# Patient Record
Sex: Female | Born: 1945
Health system: Southern US, Community
[De-identification: ages and names within clinical notes are randomized; demographics above are authoritative.]

## PROBLEM LIST (undated history)

## (undated) DIAGNOSIS — E0836 Diabetes mellitus due to underlying condition with diabetic cataract: Secondary | ICD-10-CM

## (undated) DIAGNOSIS — C55 Malignant neoplasm of uterus, part unspecified: Secondary | ICD-10-CM

## (undated) HISTORY — DX: Malignant neoplasm of uterus, part unspecified: C55

## (undated) HISTORY — DX: Diabetes mellitus due to underlying condition with diabetic cataract: E08.36

## (undated) HISTORY — PX: CHOLECYSTECTOMY: SHX55

## (undated) HISTORY — PX: ABDOMINAL HYSTERECTOMY: SHX81

## (undated) HISTORY — PX: SPINE SURGERY: SHX786

---
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
## 1998-07-18 ENCOUNTER — Ambulatory Visit: Admission: RE | Admit: 1998-07-18 | Discharge: 1998-07-18 | Payer: Self-pay | Admitting: *Deleted

## 1999-09-28 ENCOUNTER — Encounter (INDEPENDENT_AMBULATORY_CARE_PROVIDER_SITE_OTHER): Payer: Self-pay | Admitting: Specialist

## 1999-09-28 ENCOUNTER — Other Ambulatory Visit: Admission: RE | Admit: 1999-09-28 | Discharge: 1999-09-28 | Payer: Self-pay | Admitting: Obstetrics and Gynecology

## 1999-10-01 ENCOUNTER — Encounter (INDEPENDENT_AMBULATORY_CARE_PROVIDER_SITE_OTHER): Payer: Self-pay

## 1999-10-01 ENCOUNTER — Other Ambulatory Visit: Admission: RE | Admit: 1999-10-01 | Discharge: 1999-10-01 | Payer: Self-pay | Admitting: Obstetrics and Gynecology

## 1999-10-02 ENCOUNTER — Ambulatory Visit (HOSPITAL_COMMUNITY): Admission: RE | Admit: 1999-10-02 | Discharge: 1999-10-02 | Payer: Self-pay | Admitting: Obstetrics and Gynecology

## 1999-10-02 ENCOUNTER — Encounter: Payer: Self-pay | Admitting: Obstetrics and Gynecology

## 1999-10-06 ENCOUNTER — Ambulatory Visit: Admission: RE | Admit: 1999-10-06 | Discharge: 1999-10-06 | Payer: Self-pay | Admitting: Gynecologic Oncology

## 1999-10-30 ENCOUNTER — Encounter: Payer: Self-pay | Admitting: Obstetrics and Gynecology

## 1999-11-04 ENCOUNTER — Encounter (INDEPENDENT_AMBULATORY_CARE_PROVIDER_SITE_OTHER): Payer: Self-pay | Admitting: Specialist

## 1999-11-04 ENCOUNTER — Inpatient Hospital Stay (HOSPITAL_COMMUNITY): Admission: RE | Admit: 1999-11-04 | Discharge: 1999-11-07 | Payer: Self-pay | Admitting: Obstetrics and Gynecology

## 1999-11-04 DIAGNOSIS — C55 Malignant neoplasm of uterus, part unspecified: Secondary | ICD-10-CM

## 1999-11-04 HISTORY — DX: Malignant neoplasm of uterus, part unspecified: C55

## 1999-11-25 ENCOUNTER — Ambulatory Visit: Admission: RE | Admit: 1999-11-25 | Discharge: 1999-11-25 | Payer: Self-pay | Admitting: Gynecology

## 1999-11-30 ENCOUNTER — Encounter: Admission: RE | Admit: 1999-11-30 | Discharge: 2000-02-28 | Payer: Self-pay | Admitting: Radiation Oncology

## 2000-04-08 ENCOUNTER — Other Ambulatory Visit: Admission: RE | Admit: 2000-04-08 | Discharge: 2000-04-08 | Payer: Self-pay | Admitting: Obstetrics and Gynecology

## 2000-05-18 ENCOUNTER — Ambulatory Visit: Admission: RE | Admit: 2000-05-18 | Discharge: 2000-05-18 | Payer: Self-pay | Admitting: Gynecologic Oncology

## 2000-05-19 ENCOUNTER — Other Ambulatory Visit: Admission: RE | Admit: 2000-05-19 | Discharge: 2000-05-19 | Payer: Self-pay | Admitting: Obstetrics and Gynecology

## 2000-09-02 ENCOUNTER — Other Ambulatory Visit: Admission: RE | Admit: 2000-09-02 | Discharge: 2000-09-02 | Payer: Self-pay | Admitting: Obstetrics and Gynecology

## 2000-12-27 ENCOUNTER — Other Ambulatory Visit: Admission: RE | Admit: 2000-12-27 | Discharge: 2000-12-27 | Payer: Self-pay | Admitting: Gynecologic Oncology

## 2000-12-27 ENCOUNTER — Ambulatory Visit: Admission: RE | Admit: 2000-12-27 | Discharge: 2000-12-27 | Payer: Self-pay | Admitting: Gynecologic Oncology

## 2001-02-24 ENCOUNTER — Other Ambulatory Visit: Admission: RE | Admit: 2001-02-24 | Discharge: 2001-02-24 | Payer: Self-pay | Admitting: Obstetrics and Gynecology

## 2002-01-12 ENCOUNTER — Ambulatory Visit (HOSPITAL_COMMUNITY): Admission: RE | Admit: 2002-01-12 | Discharge: 2002-01-12 | Payer: Self-pay | Admitting: Obstetrics and Gynecology

## 2002-01-12 ENCOUNTER — Encounter: Payer: Self-pay | Admitting: Obstetrics and Gynecology

## 2002-02-20 ENCOUNTER — Other Ambulatory Visit: Admission: RE | Admit: 2002-02-20 | Discharge: 2002-02-20 | Payer: Self-pay | Admitting: Obstetrics and Gynecology

## 2002-02-21 ENCOUNTER — Encounter (INDEPENDENT_AMBULATORY_CARE_PROVIDER_SITE_OTHER): Payer: Self-pay | Admitting: Specialist

## 2002-02-21 ENCOUNTER — Ambulatory Visit: Admission: RE | Admit: 2002-02-21 | Discharge: 2002-02-21 | Payer: Self-pay | Admitting: Gynecologic Oncology

## 2002-08-31 ENCOUNTER — Other Ambulatory Visit: Admission: RE | Admit: 2002-08-31 | Discharge: 2002-08-31 | Payer: Self-pay | Admitting: Obstetrics and Gynecology

## 2003-09-18 ENCOUNTER — Ambulatory Visit (HOSPITAL_COMMUNITY): Admission: RE | Admit: 2003-09-18 | Discharge: 2003-09-18 | Payer: Self-pay | Admitting: Neurosurgery

## 2003-09-20 ENCOUNTER — Other Ambulatory Visit: Admission: RE | Admit: 2003-09-20 | Discharge: 2003-09-20 | Payer: Self-pay | Admitting: Obstetrics and Gynecology

## 2003-09-20 ENCOUNTER — Ambulatory Visit (HOSPITAL_COMMUNITY): Admission: RE | Admit: 2003-09-20 | Discharge: 2003-09-20 | Payer: Self-pay | Admitting: Obstetrics and Gynecology

## 2003-09-20 ENCOUNTER — Ambulatory Visit (HOSPITAL_COMMUNITY): Admission: RE | Admit: 2003-09-20 | Discharge: 2003-09-20 | Payer: Self-pay | Admitting: Neurosurgery

## 2003-09-27 ENCOUNTER — Ambulatory Visit (HOSPITAL_COMMUNITY): Admission: RE | Admit: 2003-09-27 | Discharge: 2003-09-27 | Payer: Self-pay | Admitting: Neurosurgery

## 2004-06-15 ENCOUNTER — Encounter (INDEPENDENT_AMBULATORY_CARE_PROVIDER_SITE_OTHER): Payer: Self-pay | Admitting: Specialist

## 2004-06-15 ENCOUNTER — Ambulatory Visit: Admission: RE | Admit: 2004-06-15 | Discharge: 2004-06-15 | Payer: Self-pay | Admitting: Gynecologic Oncology

## 2004-06-15 ENCOUNTER — Other Ambulatory Visit: Admission: RE | Admit: 2004-06-15 | Discharge: 2004-06-15 | Payer: Self-pay | Admitting: Gynecologic Oncology

## 2004-09-19 IMAGING — CT CT L SPINE W/ CM
2 of 8 series · 8 of 20 positions shown, 10 images · IV contrast (omnipaque)
Comparison: none

CLINICAL DATA: Patient with severe lumbar pain and left lumbar radiculopathy. 
 LUMBAR MYELOGRAM FOLLOWED BY CT OF THE LUMBAR SPINE AND ALSO MULTIPLANAR RECONSTRUCTION OF THE LUMBOSACRAL SPINE
 LUMBAR MYELOGRAM
 Following a full explanation of the procedure along with the potentially associated complications, an informed witnessed consent was obtained.
 The patient was laid prone on the fluoroscopic table. 
 The skin over the lumbosacral region was then prepped and draped in the usual sterile fashion. 
 The skin overlying the L4-5 disc space was then infiltrated with 1 percent Lidocaine and extended to the ligamentum flavum. 
 Using fluoroscopic guidance, a 22 gauge, 3 ? inch spinal needle was then advanced into the thecal space at L4-5 without difficulty.  Slightly bloody CSF was obtained which cleared. 
 Approximately 11 cc of Omnipaque 180 were then injected intrathecally without event. 
 A routine lumbar myelogram was then performed. 
 There were no acute complications and the patient tolerated the procedure well.

[Series 2: l-spine helical · axial · 0.27mm/px · z∈[-167,+3]mm · 7 of 92 slices shown, 9 images]
[im 12/92  soft-tissue]
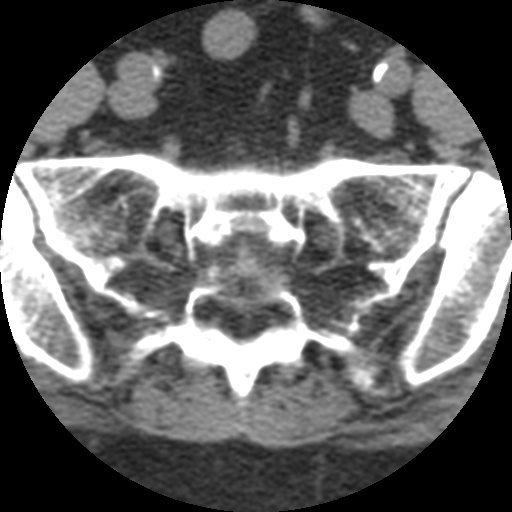
[im 12/92  bone]
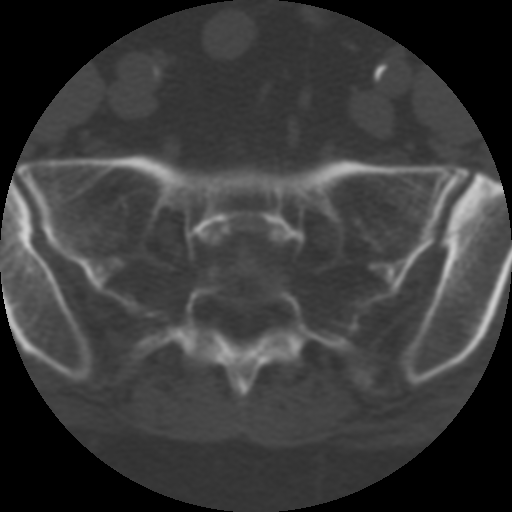
[im 23/92  bone]
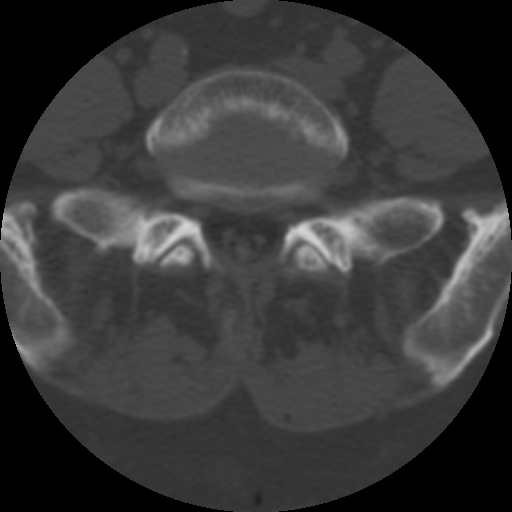
[im 35/92  bone]
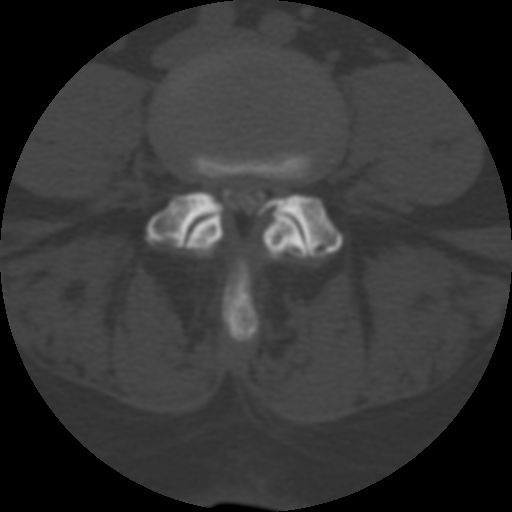
[im 46/92  bone]
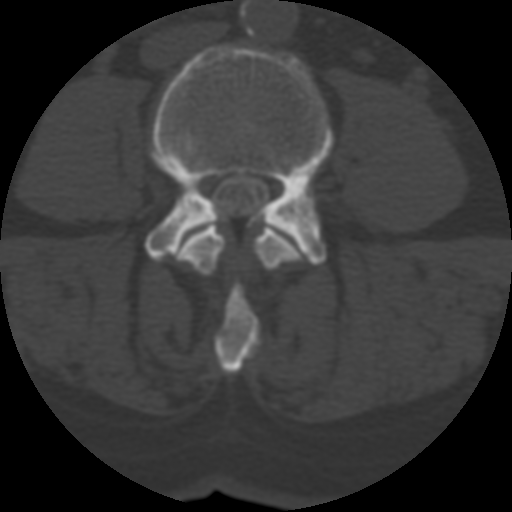
[im 57/92  soft-tissue]
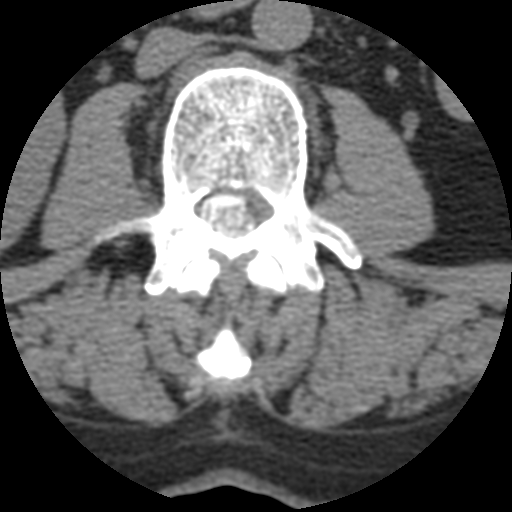
[im 57/92  bone]
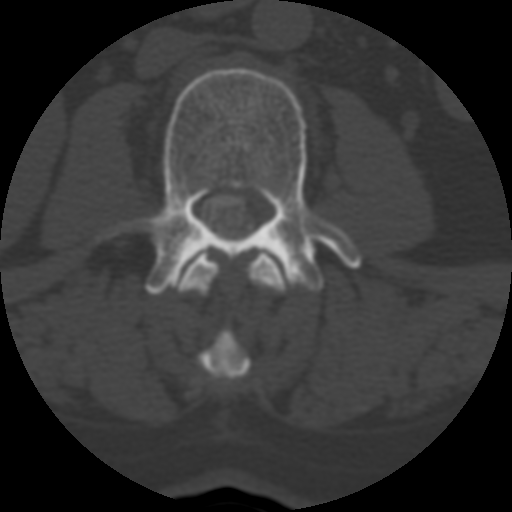
[im 69/92  bone]
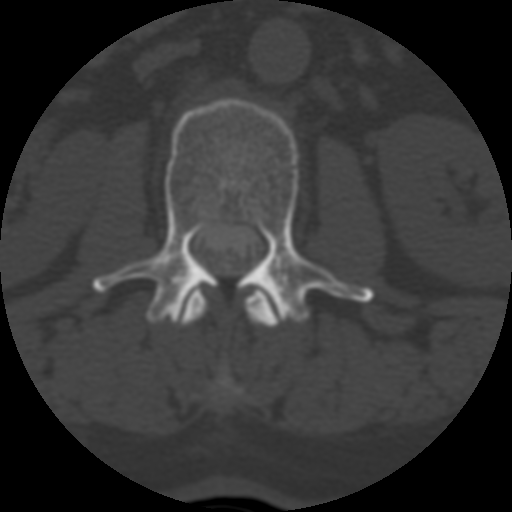
[im 80/92  bone]
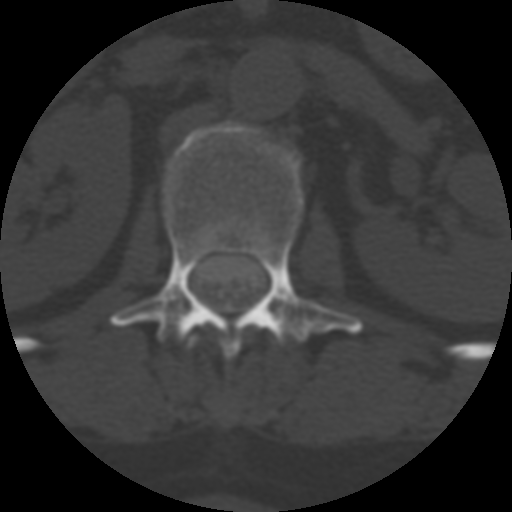

[Series 108: reformatted · coronal · 0.46mm/px · 1 of 40 slices shown]
[im 20/40  bone]
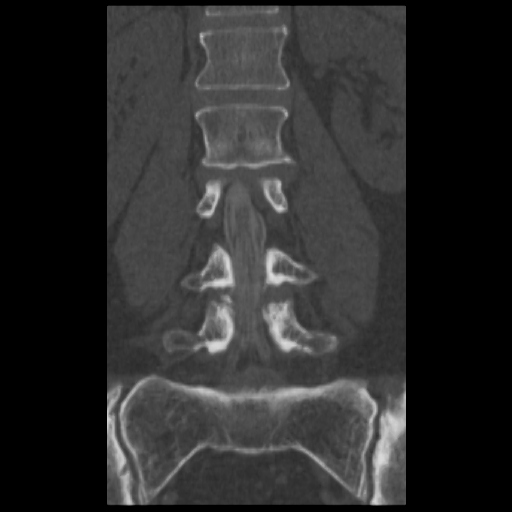

[8 of 20 positions shown; findings below may reference images not displayed]

FINDINGS: The cross-table lateral projection demonstrates alignment of the lumbosacral spine to be near anatomic.  There is suggestion of mild narrowing of the disc space at L5-S1 and also at L3-4 posteriorly and also at L2-3. 
 The vertebral body heights are otherwise maintained. 
 A mild to moderate ventral extradural defect is seen at L2-3.  A more prominent ventral extradural defect is seen at L4-5 and more so at L5-S1.  At these two levels, there is moderately severe narrowing of the thecal sac in the sagittal plane. 
 The magnified oblique images demonstrate normal thickness of the nerve roots in the cauda equina. 
 There is an indentation on the thecal sac posterolaterally on the left at L2-3 associated with mass effect on the left L-3 nerve root.  The weight bearing flexion and extension views demonstrate accentuation of the ventral extradural defects at L2-3, L4-5, and L5-S1.  
 There is no evidence of vertebral body instability on flexion and extension views. 
 IMPRESSION
 Prominent ventral extradural defects at L4-5 and L5-S1 with a moderate to moderately severe one at L2-3.  These most likely represent herniated discs at these levels.  Correlation with axial CT imaging is recommended.
 Left posterolateral indentation on the L-3 nerve root.  
 CT OF THE LUMBOSACRAL SPINE
 Contiguous transaxial images were obtained from the level of L-1 to the level of S-1 following the intrathecal administration of contrast material and soft tissue and bone windows were obtained. 
 At L1-2, there is a diffuse broad based annular disc bulge with mild facet arthropathy.  The neural foramina and the nerve roots are intact. 
 At L2-3, there is a diffuse broad based disc herniation prominent left paracentrally to left posterolaterally.  There is extension of disc material into the left lateral recess with probable impingement on the left L-3 nerve root.  There is associated moderately severe spinal canal stenosis at this level.  Associated facet arthropathy and ligamentum flavum hypertrophy contribute to the spinal canal stenosis. 
 At L3-4, there is diffuse broad based annular disc bulge with facet and ligamentum flavum hypertrophy.  There is encroachment though no contact with the exiting L-3 nerve roots and the neural foramina.  Mild spinal canal stenosis is noted. 
 At L4-5, there is a diffuse broad based disc herniation prominent centrally and left posterolaterally.  There is encroachment with probable contact with the left L-4 nerve root and the distal aspect of the left neural foramen.  Facet arthropathy with mild ligamentum flavum prominence contributes to severe thecal sac stenosis.  
 At L5-S1, there is a central broad based disc herniation with encroachment and near contact with the left S-1 nerve root.  There is encroachment though no contact with the left L-5 nerve root.  Again, facet arthropathy associated with mild ligamentum flavum prominence is contributing to the spinal canal stenosis.
 IMPRESSION
 L1-2 diffuse broad based annular disc bulge. 
 L2-3 diffuse broad based disc herniation with partial right lateral disc calcification.  The herniation is prominent left paracentrally to left posterolaterally with disc material extending into the left lateral recess and with probable contact with the left L-3 nerve root. 
 L3-4 diffuse broad based annular disc bulge. 
 L4-5 diffuse broad based disc herniation prominent left paracentral left posterolaterally with probable contact with the left L-4 nerve root.  
 L5-S1 central disc herniation with encroachment and probable contact with the left S-1 nerve root medially.  
 CT MULTIPLANAR RECONSTRUCTION
 Coronal and sagittal reconstruction images were performed from L-1 to S-1 following the administration of intrathecal contrast material. 
 The sagittal images demonstrate alignment of the lumbosacral spine to be near anatomic. The vertebral body heights are maintained.  Prominent ventral extradural defect is seen at L2-3, more prominent to the left of the midline, most likely representing disc herniation. 
 Also seen are prominent ventral extradural defects at L4-5 and L5-S1 with acquired spinal canal stenoses.  The cauda equina appears normal.  There is diffuse spinal canal stenosis extending from L2-3 to L5-S1.
 The surrounding paraspinal soft tissues are grossly unremarkable.
 IMPRESSION
 Prominent ventral extradural defects at L2-3, L4-5, and L5-S1, highly suspicious of disc herniations. Please correlate with axial images through the lumbar region.

## 2005-05-07 ENCOUNTER — Other Ambulatory Visit: Admission: RE | Admit: 2005-05-07 | Discharge: 2005-05-07 | Payer: Self-pay | Admitting: Obstetrics and Gynecology

## 2005-09-13 DIAGNOSIS — E119 Type 2 diabetes mellitus without complications: Secondary | ICD-10-CM

## 2005-09-13 HISTORY — DX: Type 2 diabetes mellitus without complications: E11.9

## 2006-02-16 ENCOUNTER — Encounter (INDEPENDENT_AMBULATORY_CARE_PROVIDER_SITE_OTHER): Payer: Self-pay | Admitting: *Deleted

## 2006-02-16 ENCOUNTER — Ambulatory Visit: Admission: RE | Admit: 2006-02-16 | Discharge: 2006-02-16 | Payer: Self-pay | Admitting: Gynecologic Oncology

## 2006-02-16 ENCOUNTER — Other Ambulatory Visit: Admission: RE | Admit: 2006-02-16 | Discharge: 2006-02-16 | Payer: Self-pay | Admitting: Gynecologic Oncology

## 2006-10-18 ENCOUNTER — Ambulatory Visit: Admission: RE | Admit: 2006-10-18 | Discharge: 2006-10-18 | Payer: Self-pay | Admitting: Gynecologic Oncology

## 2006-10-18 ENCOUNTER — Encounter (INDEPENDENT_AMBULATORY_CARE_PROVIDER_SITE_OTHER): Payer: Self-pay | Admitting: Specialist

## 2006-10-18 ENCOUNTER — Other Ambulatory Visit: Admission: RE | Admit: 2006-10-18 | Discharge: 2006-10-18 | Payer: Self-pay | Admitting: Gynecologic Oncology

## 2008-02-16 ENCOUNTER — Other Ambulatory Visit: Admission: RE | Admit: 2008-02-16 | Discharge: 2008-02-16 | Payer: Self-pay | Admitting: Obstetrics and Gynecology

## 2009-01-06 IMAGING — CR DG CHEST 2V
1 series · 2 of 2 positions shown · non-contrast
Comparison: NONE

CLINICAL DATA: History of uterine cancer. 

CHEST TWO VIEW (PA AND LATERAL)

[Series 1: view not recorded · 0.17mm/px · 2 of 2 slices shown]
[im 1/2]
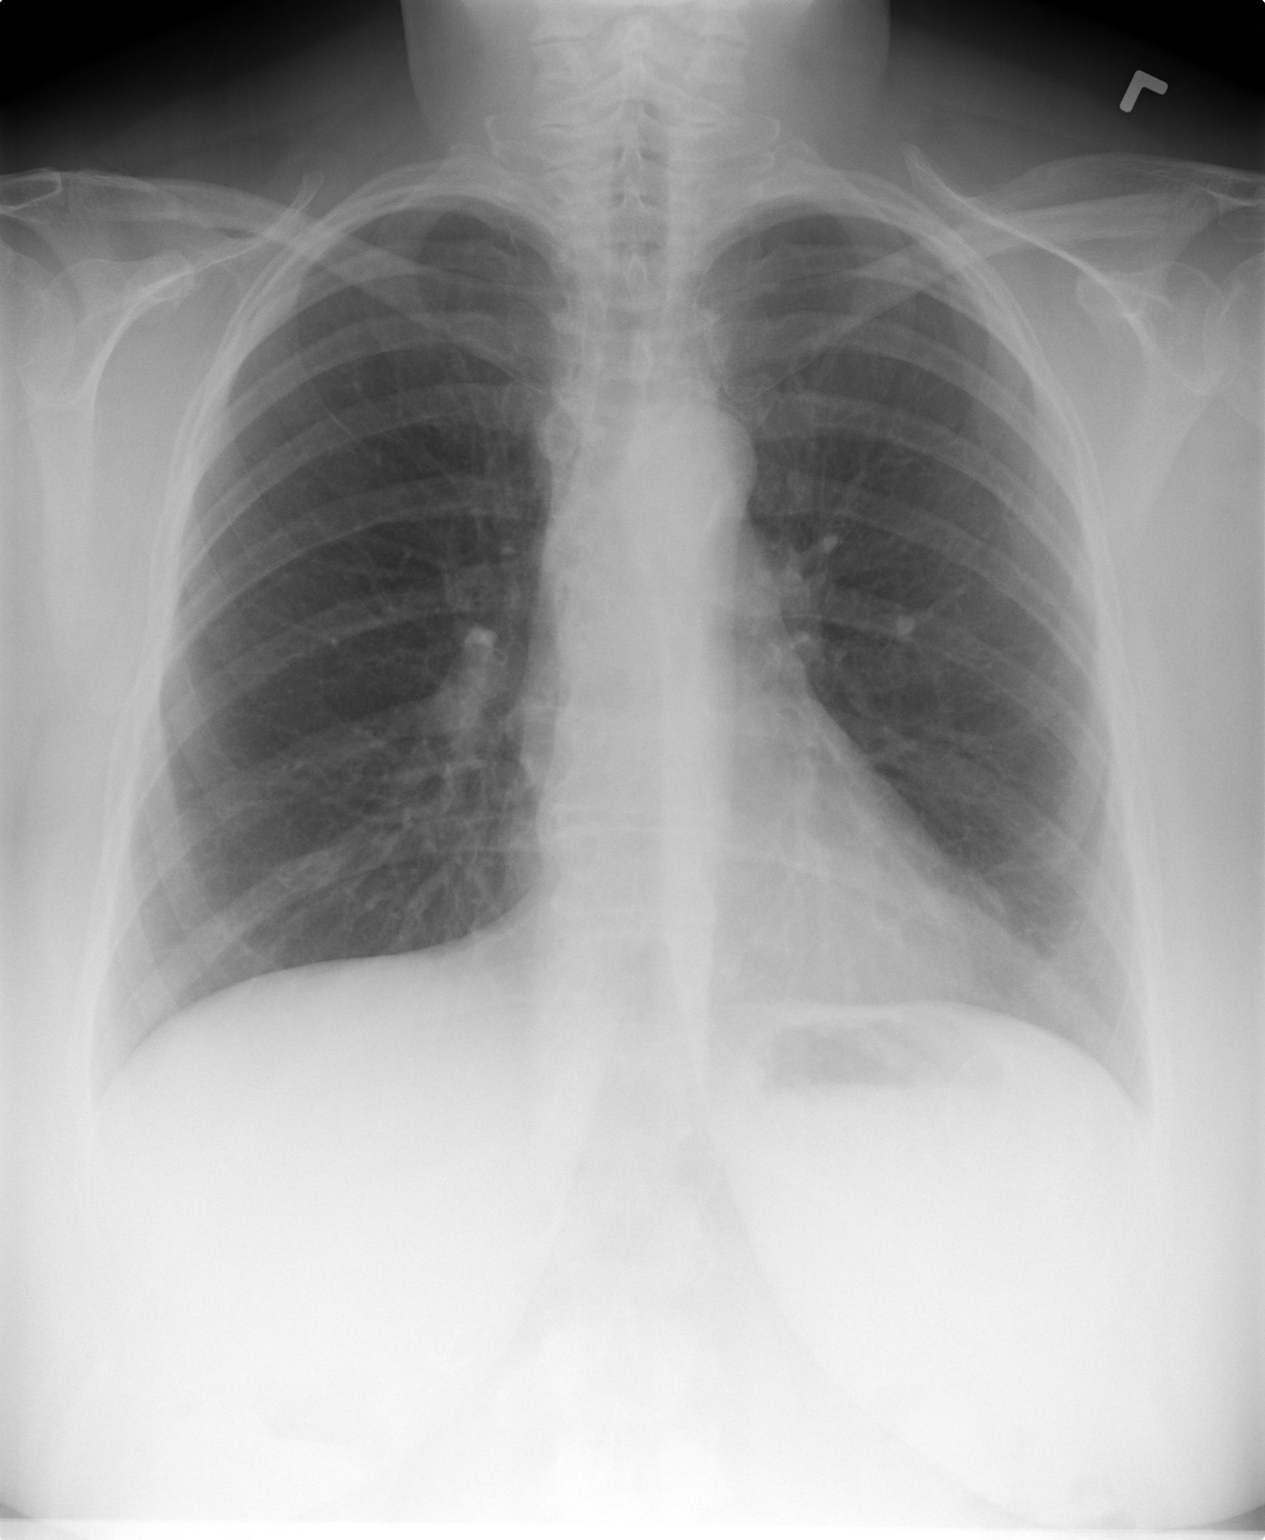
[im 2/2]
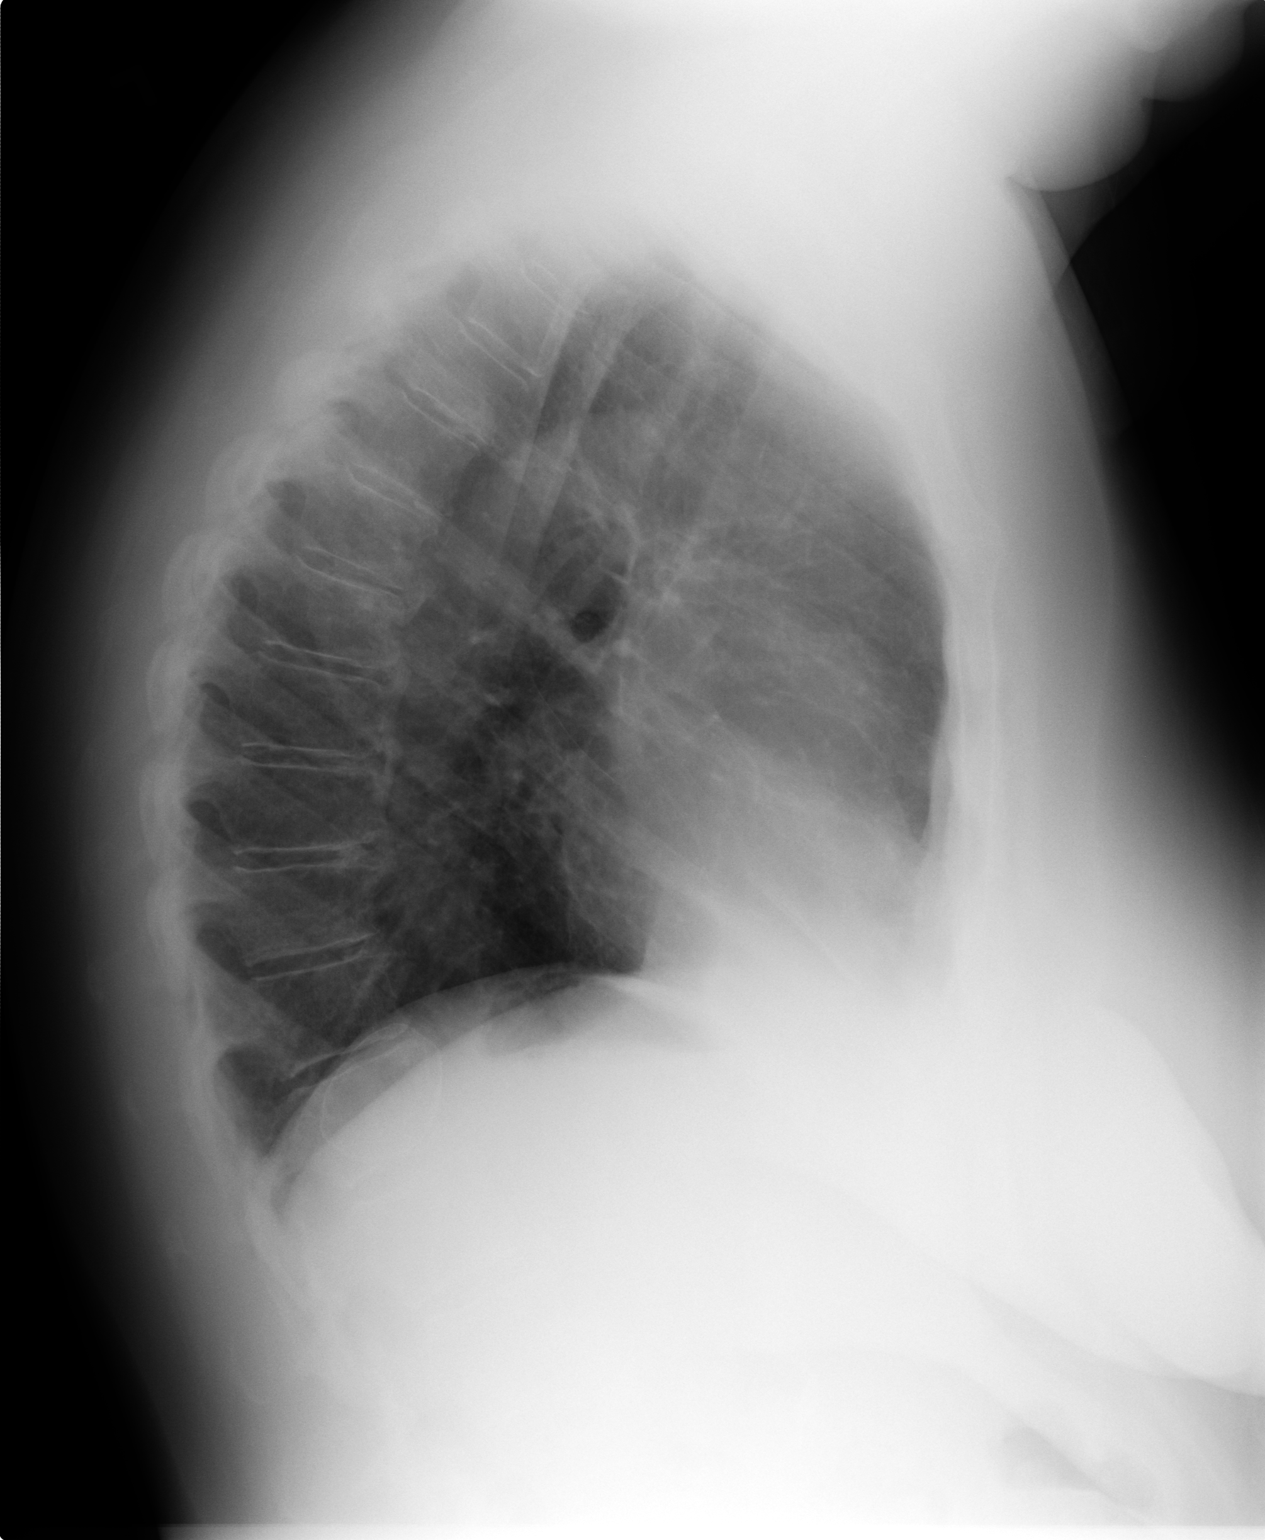

[2 of 2 positions shown; findings below may reference images not displayed]

FINDINGS: Compare 06/04/05. Heart size is normal. No infiltrate, 
edema, mass, or effusion. Mild kyphosis and spondylosis involve 
the thoracic spine.
IMPRESSION: No interval change or acute process. Sakoi, Lasvecars Modidima Date:  01/08/2008 DAS  [REDACTED]

## 2011-01-27 DIAGNOSIS — I10 Essential (primary) hypertension: Secondary | ICD-10-CM

## 2011-01-27 DIAGNOSIS — J302 Other seasonal allergic rhinitis: Secondary | ICD-10-CM

## 2011-01-27 HISTORY — DX: Other seasonal allergic rhinitis: J30.2

## 2011-01-27 HISTORY — DX: Essential (primary) hypertension: I10

## 2011-01-29 NOTE — Consult Note (Signed)
[facility]  Patient:    Suzanne Sutton, Suzanne Sutton                          MRN: [id number] Attending:  Jackquline Denmark. Kyla Balzarine, M.D. CC:         Edwena Felty. Ashley Royalty, M.D.             Billie Lade, M.D.             Telford Nab, R.N.                          Consultation Report  CHIEF COMPLAINT:  [name] returns for ongoing follow-up of surgical stage II, grade 2 endometrial adenocarcinoma.  INTERVAL HISTORY:  Since she was last seen by Reuel Boom L. Clarke-Pearson, M.D., in March, she has completed radiation therapy.  She was started on Climara because of hot flashes and has noted only mild improvement in hot flashes. She noted diarrhea and urinary urgency during radiotherapy, but these symptoms have resolved and she reports no difficulties with bowel or bladder function, pelvic pain, vaginal bleeding, or leg swelling.  She is at full functional capacity.  HISTORY OF PRESENT ILLNESS:  The patient underwent TAH, BSO, and pelvic lymphadenectomy for a clinical stage I, grade 2 endometrial adenocarcinoma. On final pathology, she had minimal superficial myometrial invasion, but cervical stromal involvement with angiolymphatic invasion, negative washings, and negative nodes.  She completed whole pelvic radiotherapy and has been followed without evidence of recurrent disease.  PAST MEDICAL HISTORY:  Significant for hypertension, bronchospastic disease, status post cholecystectomy, normal spontaneous vaginal delivery x 2, and endometrial cancer treated with TAH, BSO, nodes, and pelvic radiotherapy as above.  MEDICATIONS:  Ziac and Climara with p.r.n. use of albuterol.  ALLERGIES:  The patient denies medical allergies.  PERSONAL/SOCIAL HISTORY:  The patient is married.  Jehovahs Witness.  Denies tobacco or ethanol.  FAMILY MEDICAL HISTORY:  Mother with lung cancer.  Maternal grandmother with diabetes.  Father with myocardial infarction.  Mother with coronary  artery disease post CABG.  A son and brother have Crohns disease.  REVIEW OF SYSTEMS:  Negative other than above with no systemic, ENT, cardiopulmonary, GI/GU, GYN, musculoskeletal, skin, heme/lymph, or endocrinologic problems.  PHYSICAL EXAMINATION:  Weight 208 pounds.  GENERAL APPEARANCE:  The patient is alert and oriented x 3 and in no acute distress.  HEENT:  Benign.  NECK:  Supple without goiter.  There is no pathologic lymphadenopathy.  BACK:  There is no back or CVA tenderness.  ABDOMEN:  Obese, soft, and benign with a well-healed midline incision.  There is no ascites, mass, organomegaly, hernia, or tenderness.  EXTREMITIES:  Full range of motion lymphedema.  PELVIC:  The external genitalia and BUS are normal to inspection and palpation.  The bladder and urethra are well supported.  The vaginal mucosa is modestly atrophic without lesions.  The cervix and uterus are absent. Bimanual and rectovaginal examinations reveal no mass or nodularity.  ASSESSMENT: 1. Stage IIB, grade 2 endometrial adenocarcinoma. 2. Hormone replacement with Climara patch. 3. Hypertension.  PLAN AND RECOMMENDATIONS:  The patient will see Edwena Felty. Ashley Royalty, M.D., in November and we will see her back for follow-up in approximately six months. Available data suggests no increase in incidents for frequency of recurrence in patients with endometrial cancer who are treated with hormonal replacement following definitive surgery and hormone replacement would continue to be permissible.  She may  require vaginal Premarin in the future.  I would recommend that the patient be examined at approximately three-month intervals until she has completed one year of follow-up from radiation.  She can then be followed at six-month intervals, alternating between [facility]. Ashley Royalty, M.D., and myself.  She should have annual mammograms and chest x-ray arranged by Dr. Ashley Royalty and psychology at each visit.  Todays  Pap smear results will be communicated to the patient. DD:  05/18/00 TD:  05/18/00 Job: [id number]

## 2011-01-29 NOTE — Consult Note (Signed)
[facility]  Patient:    Suzanne Sutton, Suzanne Sutton Visit Number: [id number] MRN: [id number]          Service Type: GON Location: GYN Attending Physician:  Sabino Donovan Dictated by:   Jackquline Denmark. Kyla Balzarine, M.D. Proc. Date: 02/21/02 Admit Date:  02/21/2002   CC:         Cynthia P. Ashley Royalty, M.D.  Billie Lade, M.D.  Telford Nab, R.N.   Consultation Report  CHIEF COMPLAINT:  The patient returns for ongoing followup of stage IIb, grade 2 endometrial adenocarcinoma.  INTERVAL HISTORY:  Since she was last seen in September, she has had a negative evaluation by Dr. Edwena Felty. Matthews and mammograms were negative. Chest x-ray and ENT CT scan were negative as evaluation for cough and "allergies."  She has occasional symptoms of radiation enteritis with tenesmus and diarrhea, depending on diet.  She denies symptoms of cystitis, pelvic pain, vaginal bleeding or leg swelling.  She is at full functional capacity.  HISTORY OF PRESENT ILLNESS:  The patient underwent TAH, BSO and pelvic lymphadenectomy for clinical stage Ib, grade 2 endometrial carcinoma.  On final pathology, she had cervical stromal involvement angiolymphatic invasion, negative washings and negative nodes (stage IIb).  She completed pelvic radiotherapy in 2001 and has been followed without evidence of disease.  PAST MEDICAL HISTORY:  Significant for hypertension, bronchospastic disease and seasonal allergies.  Status post cholecystectomy, TAH, BSO, nodes and pelvic radiotherapy as above; NSVD x2.  MEDICATIONS:  Lotensin, Cozaar, albuterol, Allegra.  ALLERGIES:  Denies.  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  FAMILY MEDICAL HISTORY:  Lung cancer, diabetes, myocardial infarction.  Son and brother have Crohns disease.  REVIEW OF SYSTEMS:  Negative other than above.  PHYSICAL EXAMINATION:  VITAL SIGNS:  Vital signs stable and afebrile.  Weight 208  pounds.  GENERAL:  The patient is alert and oriented x3, in no acute distress.  ENT:  Benign with clear oropharynx.  NECK:  Supple without goiter.  There is no pathologic lymphadenopathy.  BACK:  There is no back or CVA tenderness.  ABDOMEN:  The abdomen is obese, soft and benign with well-healed midline incision.  There is no ascites, mass, hernia or tenderness.  EXTREMITIES:  Full range of motion without lymphadenopathy.  PELVIC:  External genitalia and BUS are normal.  Bladder and urethra are well-supported.  Vaginal mucosa is atrophic with telangiectasia at the cuff but no mucosal lesions.  Cervix and uterus are absent.  Bimanual and rectovaginal examinations reveal no adnexal mass or parametrial nodularity.  ASSESSMENT: 1. Stage IIb, grade 2 endometrial adenocarcinoma. 2. Mild radiation enteritis.  PLAN:  Continue alternating followup with Dr. Ashley Royalty at six-month intervals; we will see her back for followup in one year.  Pap smear obtained today and will be communicated to the patient. Dictated by:   Jackquline Denmark. Kyla Balzarine, M.D. Attending Physician:  Ronita Hipps T DD:  02/21/02 TD:  02/23/02 Job: [id number]

## 2011-01-29 NOTE — Consult Note (Signed)
Suzanne Sutton, Suzanne Sutton                 ACCOUNT NO.:  [id number]   MEDICAL RECORD NO.:  [id number]          PATIENT TYPE:  OUT   LOCATION:  GYN                          FACILITY:  [facility]   PHYSICIAN:  John T. Kyla Balzarine, M.D.    DATE OF BIRTH:  Jul 25, 1946   DATE OF CONSULTATION:  10/18/2006  DATE OF DISCHARGE:                                 CONSULTATION   CHIEF COMPLAINT:  Followup of endometrial cancer.   HISTORY OF PRESENT ILLNESS:  This patient underwent TAH/BSO and lymph  node dissections in February 2001 and was diagnosed as stage IIB, grade  2 endometrial cancer.  She received pelvic radiotherapy and has been  followed through 2007 without evidence of recurrent disease.  She has  essentially normal bowel and bladder functions, denies vaginal bleeding,  and had improvement in vaginal dryness with vaginal estrogen.  She has  seen no further vaginal spotting.  She denies leg swelling or any pelvic  pain.   PAST HISTORY:   PAST MEDICAL HISTORY:  1. Hypertension.  2. Bronchospastic disease.  3. Seasonal allergies.  4. She is status post cholecystectomy.  5. NSVD x2 and endometrial cancer therapy as above.   MEDICATIONS:  Lotensin, albuterol, Cozaar, Allegra and vitamins/calcium.   ALLERGIES:  Denies.   PERSONAL AND SOCIAL HISTORY:  The patient is a married Jehovah's  Witness.  She denies tobacco or ethanol use.   FAMILY HISTORY:  Noncontributory without gynecologic or breast cancers.   REVIEW OF SYSTEMS:  Negative in the remainder of 10 systems.   PHYSICAL EXAMINATION:  VITAL SIGNS:  Weight 186 pounds, blood pressure  130/78.  The patient is anxious, alert, and oriented x3 in no acute  distress.  There is no pathologic lymphadenopathy on lymph node survey.  BACK:  No back or CVA tenderness.  ABDOMEN:  Obese, soft, benign with no ascites, mass or organomegaly.  No  hernias palpable.  EXTREMITIES:  Full strength and range of motion with no edema.  PELVIC:  External genitalia and  BUS normal to inspection and palpation.  Bladder and urethra normal.  Vagina is atrophic with no mucosal lesions.  Bimanual and rectovaginal examinations reveal absent uterus and cervix  with no mass or nodularity.   ASSESSMENT:  Stage IIB carcinoma of the uterus and vaginal atrophy.   PLAN:  The patient was given refills for vaginal Premarin.  The patient,  at this juncture, is far out enough from her endometrial cancer surgery  that she could be seen at annual intervals.  She could see Dr. Tresa Res  rather than Dr. Ashley Royalty, who is retired.      John T. Kyla Balzarine, M.D.  Electronically Signed     JTS/MEDQ  D:  10/18/2006  T:  10/18/2006  Job:  [id number]   cc:   Edwena Felty. Romine, M.D.  Fax: [phone]   Billie Lade, M.D.  Fax: [phone]   Telford Nab, R.N.  [address]  [place], [place] [address]

## 2011-01-29 NOTE — Consult Note (Signed)
[facility]  Patient:    Suzanne Sutton, Suzanne Sutton                    MRN: [id number] Proc. Date: 12/27/00 Adm. Date:  [id number] Attending:  Ronita Hipps T CC:         Edwena Felty. Ashley Royalty, M.D.  Billie Lade, M.D.  Telford Nab, R.N.   Consultation Report  Ms. Tennyson returns for ongoing followup of stage II, grade 2 endometrial adenocarcinoma.  INTERVAL HISTORY:  Since she was last seen in September, she has discontinued hormonal replacement.  She had a negative evaluation by Dr. Edwena Felty. Ashley Royalty practice and is due for mammograms.  She has had essentially resolution of radiation enteritis and cystitis therapy.  She relates no pelvic pain, vaginal bleeding or leg swelling.  She is at full functional capacity.  HISTORY OF PRESENT ILLNESS:  Patient underwent TAH/BSO and pelvic lymphadenectomy for clinical stage I, grade 2 endometrial carcinoma.  On final pathology, she had cervical stromal involvement with angiolymphatic invasion, negative washings and negative nodes (stage ____).  She completed pelvic radiotherapy approximately one year ago and has been followed without evidence of recurrent disease.  PAST MEDICAL HISTORY:  Past medical history significant for hypertension; bronchospastic disease; status post cholecystectomy, TAH/BSO, nodes and pelvic radiotherapy as above; normal spontaneous vaginal delivery x 2.  MEDICATIONS:  Ziac.  P.r.n. use of albuterol.  ALLERGIES:  Denies medical allergies.  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  FAMILY MEDICAL HISTORY:  Mother with lung cancer, maternal grandmother with diabetes, father with myocardial infarction, mother with coronary artery disease.  Son and brother have Crohns disease.  REVIEW OF SYSTEMS:  Negative other than above.  PHYSICAL EXAMINATION:  VITAL SIGNS:  Weight 207 pounds.  Blood pressure 134/80, pulse 84.  GENERAL:  The patient is  alert and oriented x 3, in no acute distress.  ENT:  Benign.  NECK:  Neck supple without goiter.  There is no pathologic lymphadenopathy.  BACK:  No back or CVA tenderness.  ABDOMEN:  Abdomen obese, soft and benign with well-healed midline incision. There is no ascites, mass, hernia or tenderness.  EXTREMITIES:  Full range of motion without lymphedema.  PELVIC:  External genitalia and BUS are normal.  Bladder and urethra are well-supported.  Vaginal mucosa is atrophic without lesions.  Cervix and uterus are absent.  Bimanual and rectovaginal examinations reveal no mass or nodularity.  ASSESSMENT:  Stage ____, grade 2 endometrial adenocarcinoma.  PLAN:  The patient has been followed without evidence of recurrence for a year post therapy.  We can begin alternating followup with Dr. Ashley Royalty at six-month intervals.  Pap smear today will be communicated to the patient. DD:  12/27/00 TD:  12/28/00 Job: [id number]

## 2011-01-29 NOTE — Consult Note (Signed)
Suzanne Sutton, Suzanne Sutton                 ACCOUNT NO.:  [id number]   MEDICAL RECORD NO.:  [id number]          PATIENT TYPE:  OUT   LOCATION:  GYN                          FACILITY:  [facility]   PHYSICIAN:  John T. Kyla Balzarine, M.D.    DATE OF BIRTH:  Dec 23, 1945   DATE OF CONSULTATION:  06/15/2004  DATE OF DISCHARGE:                                   CONSULTATION   CHIEF COMPLAINT:  Follow up of endometrial cancer.   INTERVAL HISTORY:  She was last evaluated by [place] in June, 2003.  She had an  interval examination with Dr. Ashley Royalty that was negative but has not  returned here because of her husband developing cancer and because of back  surgery last year.  The patient has essentially bowel and bladder functions  with a small amount of diarrhea, which she controls with herbal medicines.  After she picked up the patient, she developed bilateral lower quadrant  discomfort and a sensation of swelling, but this resolved after spinal  manipulation.  She denies pelvic pain, leg swelling, vaginal discharge or  bleeding.   HISTORY OF PRESENT ILLNESS:  The patient presented with endometrial cancer,  undergoing TAH/BSO and lymph node dissections in February, 2001.  She  subsequently received pelvic radiotherapy for stage 2B, grade 2 disease.   PAST MEDICAL HISTORY:  1.  Hypertension.  2.  Bronchospastic disease.  3.  Seasonal allergies.  4.  Status post cholecystectomy.  5.  TAH/BSO/nodes and pelvic radiotherapy, as above.  6.  NSVD x2.   MEDICATIONS:  Lotensin, albuterol, Cozaar, Allegra, and herbal bowel  regimen.   ALLERGIES:  Denied.   SOCIAL HISTORY:  Married.  Jehovah's Witness.  Denies tobacco or ethanol.   FAMILY HISTORY:  Otherwise noncontributory.   REVIEW OF SYSTEMS:  Otherwise noncontributory.   PHYSICAL EXAMINATION:  VITAL SIGNS:  Stable.  Afebrile.  Blood pressure  125/82.  Weight 194 pounds.  GENERAL:  Patient is alert and oriented x 3 in no acute distress.  LYMPH SURVEY:  No  pathologic lymphadenopathy.  LUNGS:  Lung fields are clear.  MUSCULOSKELETAL:  There is no back or CVA tenderness.  ABDOMEN:  Obese.  Soft, benign.  Without ascites, tenderness, or hernia.  No  mass or organomegaly is palpable.  EXTREMITIES:  Full strength and range of motion without edema.  PELVIC:  External genitalia and BUS are normal to inspection and palpation.  The vagina is clear without mucosal lesions and has good support of the  bladder and rectum.  Bimanual and rectovaginal examination reveal no mass or  nodularity.  Absent uterus and cervix.  No tenderness.   ASSESSMENT:  Endometrial carcinoma, no active disease.   PLAN:  Patient will see Dr. Ashley Royalty for followup in six months and can  return to [place] in one year.  When we have completed five years of followup,  she can be followed on an annual basis by Dr. Ashley Royalty.     John   JTS/MEDQ  D:  06/15/2004  T:  06/15/2004  Job:  [id number]   cc:   Altamese Dilling, M.D.  Billie Lade, M.D.  [address] [facility]  [place]  [place] [address]  Fax: [phone]   Telford Nab, R.N.  [phone] [address]  [place], [place] [address]

## 2011-01-29 NOTE — Op Note (Signed)
NAME:  Suzanne Sutton, Suzanne Sutton                           ACCOUNT NO.:  [id number]   MEDICAL RECORD NO.:  [id number]                   PATIENT TYPE:  OIB   LOCATION:  3011                                 FACILITY:  [facility]   PHYSICIAN:  Coletta Memos, M.D.                  DATE OF BIRTH:  02-10-1946   DATE OF PROCEDURE:  09/27/2003  DATE OF DISCHARGE:                                 OPERATIVE REPORT   PREOPERATIVE DIAGNOSIS:  Left L2-3 displaced disk.  Left L2-3  radiculopathies.   POSTOPERATIVE DIAGNOSIS:  Left L2-3 displaced disk.  Left L2-3  radiculopathies.   PROCEDURE:  Left L2-3 semihemilaminectomy and diskectomy with  microdissection.   COMPLICATIONS:  None.   SURGEON:  Coletta Memos, M.D.   ASSISTANT:  Danae Orleans. Venetia Maxon, M.D.   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  The pain  radiates into the region of the groin never going below the knee.  Myelogram  and post myelogram CT shows a large L2-3 disk herniation.  I recommended and  she has agreed to undergo operative decompression.   DESCRIPTION OF PROCEDURE:  The patient was brought to the operating room,  intubated, and placed under general anesthesia without difficulty.  She was  positioned prone on the Wilson frame and all pressure points were properly  padded.  Her skin was prepped and she was draped in a sterile fashion.  Using an intraoperative localizer, I infiltrated 10 mL of 0.5% lidocaine  1:200,000 strength epinephrine.  I then made the skin incision and took this  down to the thoracolumbar fascia.  I exposed the lamina of what I believed  to be L2 and L3. I took an x-ray after placing a probe inferior to the  lamina of L2.  The probe was in the correct intralaminar space.  Then using  a high speed air drill, proceeded with a semihemilaminectomy of L2.  I used  the Kerrison punch and also removed a portion of L3.  I did  drill down some  of the facet medially.  After removing the ligamentum flavum and exposing  the thecal sac, I brought the microscope into the operative field.  Using  microdissection, I then retracted the thecal sac medially and opened the  disk space.  I removed a significant amount of disk from the disk space.  With Dr. Fredrich Birks assistance, I also removed more disk and was able to remove  what I believed to be the fragment causing the most significant problem  which was caudal to the disk space.  I explored both the inferior  neuroforamen of L3 and the neuroforamen at the level of L2 and felt that  there was no more compression of the nerve roots.  I then irrigated the  wound.  I then closed the wound  in layered fashion with Dr. Fredrich Birks  assistance.  Vicryl was used to reapproximate the thoracolumbar fascia and  subcutaneous tissues.  Dermabond used for a sterile dressing.  The patient  tolerated the procedure without difficulty.  She was awake and moving all  extremities.                                               Coletta Memos, M.D.    KC/MEDQ  D:  09/27/2003  T:  09/27/2003  Job:  [id number]

## 2011-01-29 NOTE — Consult Note (Signed)
NAMEELOINA, ERGLE                 ACCOUNT NO.:  [id number]   MEDICAL RECORD NO.:  [id number]          PATIENT TYPE:  OUT   LOCATION:  GYN                          FACILITY:  [facility]   PHYSICIAN:  John T. Kyla Balzarine, M.D.    DATE OF BIRTH:  1946-02-10   DATE OF CONSULTATION:  02/16/2006  DATE OF DISCHARGE:                                   CONSULTATION   CHIEF COMPLAINT:  Follow up of endometrial cancer.   HISTORY OF PRESENT ILLNESS:  The patient underwent TAH/BSO and lymph node  dissections in February 2001 and subsequently received pelvic radiotherapy  for stage IIB, grade 2 disease.  Last evaluation by [place] in October 2005 is  NAD.  She has essentially normal bowel and bladder functions, denies vaginal  bleeding other than two episodes of spotting associated with straining.  She  denies new leg swelling or new pelvic pain.   PAST MEDICAL HISTORY:  1.  Hypertension.  2.  Bronchospastic disease.  3.  Seasonal allergies.  4.  Cholecystectomy.  5.  TAH/BSO.  6.  Nodes and pelvic radiotherapy as above.  7.  Status post NSVD x2.   MEDICATIONS:  Lotensin, albuterol, Cozaar, Allegra and herbal regimen.   ALLERGIES:  Denies.   PERSONAL/SOCIAL HISTORY:  A married Jehovah's witness.  Denies tobacco or  ethanol.   FAMILY HISTORY:  Otherwise noncontributory.   REVIEW OF SYSTEMS:  Otherwise negative.   EXAM:  Weight 189 pounds.  The patient is anxious, alert and oriented x3 in  no acute distress.  There is no pathologic lymphadenopathy.  There is no  back or CVA tenderness.  Abdomen is obese, soft and benign with well-healed  incision.  No ascites, mass or hernia.  EXTREMITIES:  Full strength and range of motion with no edema.  PELVIC:  External genitalia and BUS are normal to inspection and palpation.  Bladder and urethra are well-supported.  The vagina is atrophic with  telangiectasia at the apex.  Bimanual and rectovaginal examinations reveal  absent uterus and cervix with no mass or  nodularity.   ASSESSMENT:  Stage IIB carcinoma of the uterus, grade 2.  Vaginal atrophy.   PLAN:  The patient can use vaginal Premarin 1-2 grams per week and was  instructed in use and given a prescription.  Today's Pap smear will be  communicated.  We will continue to alternate follow-up with Dr. Ashley Royalty and  would plan to see her back for follow-up in our unit at approximately one  year.      John T. Kyla Balzarine, M.D.  Electronically Signed     JTS/MEDQ  D:  02/16/2006  T:  02/16/2006  Job:  [id number]   cc:   Ashley Royalty, M.D.   Billie Lade, M.D.  Fax: [phone]   Telford Nab, R.N.  [address]  [place], [place] [address]

## 2011-02-22 ENCOUNTER — Other Ambulatory Visit (HOSPITAL_COMMUNITY): Payer: Self-pay | Admitting: Endocrinology

## 2011-02-22 DIAGNOSIS — E0590 Thyrotoxicosis, unspecified without thyrotoxic crisis or storm: Secondary | ICD-10-CM

## 2011-03-04 ENCOUNTER — Ambulatory Visit (HOSPITAL_COMMUNITY): Payer: Self-pay

## 2011-03-05 ENCOUNTER — Other Ambulatory Visit (HOSPITAL_COMMUNITY): Payer: Self-pay

## 2012-04-21 ENCOUNTER — Other Ambulatory Visit: Payer: Self-pay | Admitting: Family Medicine

## 2012-04-21 ENCOUNTER — Other Ambulatory Visit (HOSPITAL_COMMUNITY)
Admission: RE | Admit: 2012-04-21 | Discharge: 2012-04-21 | Disposition: A | Discharge status: Home / Self Care | Payer: Medicare Other | Source: Ambulatory Visit | Attending: Family Medicine | Admitting: Family Medicine

## 2012-04-21 DIAGNOSIS — Z124 Encounter for screening for malignant neoplasm of cervix: Secondary | ICD-10-CM | POA: Insufficient documentation

## 2013-05-01 ENCOUNTER — Other Ambulatory Visit: Payer: Medicare Other

## 2013-05-02 ENCOUNTER — Other Ambulatory Visit: Payer: Medicare Other

## 2013-05-03 ENCOUNTER — Ambulatory Visit: Payer: Medicare Other | Admitting: Endocrinology

## 2013-05-07 ENCOUNTER — Other Ambulatory Visit: Payer: Self-pay | Admitting: *Deleted

## 2013-05-09 ENCOUNTER — Other Ambulatory Visit: Payer: Medicare Other

## 2013-05-09 ENCOUNTER — Other Ambulatory Visit: Payer: Self-pay | Admitting: *Deleted

## 2013-05-09 ENCOUNTER — Other Ambulatory Visit (INDEPENDENT_AMBULATORY_CARE_PROVIDER_SITE_OTHER): Payer: Medicare Other

## 2013-05-09 DIAGNOSIS — E039 Hypothyroidism, unspecified: Secondary | ICD-10-CM | POA: Insufficient documentation

## 2013-05-09 DIAGNOSIS — E119 Type 2 diabetes mellitus without complications: Secondary | ICD-10-CM

## 2013-05-09 DIAGNOSIS — E785 Hyperlipidemia, unspecified: Secondary | ICD-10-CM

## 2013-05-09 HISTORY — DX: Hyperlipidemia, unspecified: E78.5

## 2013-05-09 LAB — URINALYSIS
Bilirubin Urine: NEGATIVE
Hgb urine dipstick: NEGATIVE
Leukocytes, UA: NEGATIVE
Nitrite: NEGATIVE
Specific Gravity, Urine: 1.02 (ref 1.000–1.030)
Total Protein, Urine: NEGATIVE
Urine Glucose: NEGATIVE

## 2013-05-09 LAB — MICROALBUMIN / CREATININE URINE RATIO
Creatinine,U: 57.3 mg/dL
Microalb Creat Ratio: 2.6 mg/g (ref 0.0–30.0)
Microalb, Ur: 1.5 mg/dL (ref 0.0–1.9)

## 2013-05-09 LAB — HEMOGLOBIN A1C: Hgb A1c MFr Bld: 6.5 % (ref 4.6–6.5)

## 2013-05-10 LAB — COMPREHENSIVE METABOLIC PANEL
ALT: 24 U/L (ref 0–35)
AST: 20 U/L (ref 0–37)
Albumin: 4.1 g/dL (ref 3.5–5.2)
Alkaline Phosphatase: 35 U/L — ABNORMAL LOW (ref 39–117)
CO2: 31 mEq/L (ref 19–32)
Calcium: 9.4 mg/dL (ref 8.4–10.5)
Chloride: 100 mEq/L (ref 96–112)
Creatinine, Ser: 0.8 mg/dL (ref 0.4–1.2)
GFR: 81.73 mL/min (ref >60.00)
Glucose, Bld: 150 mg/dL — ABNORMAL HIGH (ref 70–99)
Potassium: 4 mEq/L (ref 3.5–5.1)
Total Bilirubin: 0.8 mg/dL (ref 0.3–1.2)
Total Protein: 6.6 g/dL (ref 6.0–8.3)

## 2013-05-10 LAB — LIPID PANEL
Cholesterol: 179 mg/dL (ref 0–200)
HDL: 56 mg/dL (ref >39.00)
LDL Cholesterol: 104 mg/dL — ABNORMAL HIGH (ref 0–99)
Total CHOL/HDL Ratio: 3
Triglycerides: 97 mg/dL (ref 0.0–149.0)
VLDL: 19.4 mg/dL (ref 0.0–40.0)

## 2013-05-10 LAB — TSH: TSH: 2.6 u[IU]/mL (ref 0.35–5.50)

## 2013-05-10 LAB — T4, FREE: Free T4: 1.04 ng/dL (ref 0.60–1.60)

## 2013-05-11 ENCOUNTER — Encounter: Payer: Self-pay | Admitting: Endocrinology

## 2013-05-11 ENCOUNTER — Ambulatory Visit: Payer: Medicare Other | Admitting: Endocrinology

## 2013-05-11 ENCOUNTER — Ambulatory Visit (INDEPENDENT_AMBULATORY_CARE_PROVIDER_SITE_OTHER): Payer: Medicare Other | Admitting: Endocrinology

## 2013-05-11 VITALS — BP 118/74 | HR 99 | Temp 98.1°F | Resp 10 | Ht 65.0 in | Wt 183.6 lb

## 2013-05-11 DIAGNOSIS — E039 Hypothyroidism, unspecified: Secondary | ICD-10-CM

## 2013-05-11 DIAGNOSIS — E785 Hyperlipidemia, unspecified: Secondary | ICD-10-CM

## 2013-05-11 DIAGNOSIS — E119 Type 2 diabetes mellitus without complications: Secondary | ICD-10-CM

## 2013-05-11 HISTORY — DX: Hypothyroidism, unspecified: E03.9

## 2013-05-11 NOTE — Patient Instructions (Addendum)
Watch dairy and animal fats

## 2013-05-11 NOTE — Progress Notes (Addendum)
Patient ID: Suzanne Sutton, female   DOB: 07-28-1946, 67 y.o.   MRN: [id number]   Reason for Appointment: Diabetes follow-up   History of Present Illness   Diagnosis: Type 2 DIABETES MELITUS, date of diagnosis:? 2007         Oral hypoglycemic drugs: Metformin        Side effects from medications: None.. She has had mild diabetes which has usually been well controlled with metformin alone and A1c is usually upper normal. Monitors blood glucose: Once a day.    Glucometer: Bayer          Blood Glucose readings from meter download: readings before breakfast:  75-100 pcs 120-170 Hypoglycemia frequency: Never.          Meals: 3 meals per day.          Physical activity: exercise: Some walking             The last HbgA1c was reported as 6.2    Wt Readings from Last 3 Encounters:  05/11/13 183 lb 9.6 oz (83.28 kg)   HYPOTHYROIDISM: The patient has had mild Hypothyroidism that was preceded by transient hyperthyroidism likely to be from autoimmune disease or thyroiditis She has however been maintained on low-dose levothyroxine 50 mcg since about 6/12. She does not have any unusual fatigue or cold intolerance, has had fairly stable weight and is compliant with her medication Also on initial presentation she had a multinodular goiter which has been smaller subsequently   LABS:  Appointment on 05/09/2013  Component Date Value Range Status  . TSH 05/09/2013 2.60  0.35 - 5.50 uIU/mL Final  . Free T4 05/09/2013 1.04  0.60 - 1.60 ng/dL Final  . Hemoglobin Y8M 05/09/2013 6.5  4.6 - 6.5 % Final   Glycemic Control Guidelines for People with Diabetes:Non Diabetic:  <6%Goal of Therapy: <7%Additional Action Suggested:  >8%   . Color, Urine 05/09/2013 LT. YELLOW  Yellow;Lt. Yellow Final  . APPearance 05/09/2013 CLEAR  Clear Final  . Specific Gravity, Urine 05/09/2013 1.020  1.000-1.030 Final  . pH 05/09/2013 6.5  5.0 - 8.0 Final  . Total Protein, Urine 05/09/2013 NEGATIVE  Negative Final  . Urine  Glucose 05/09/2013 NEGATIVE  Negative Final  . Ketones, ur 05/09/2013 NEGATIVE  Negative Final  . Bilirubin Urine 05/09/2013 NEGATIVE  Negative Final  . Hgb urine dipstick 05/09/2013 NEGATIVE  Negative Final  . Urobilinogen, UA 05/09/2013 0.2  0.0 - 1.0 Final  . Leukocytes, UA 05/09/2013 NEGATIVE  Negative Final  . Nitrite 05/09/2013 NEGATIVE  Negative Final  . Sodium 05/09/2013 137  135 - 145 mEq/L Final  . Potassium 05/09/2013 4.0  3.5 - 5.1 mEq/L Final  . Chloride 05/09/2013 100  96 - 112 mEq/L Final  . CO2 05/09/2013 31  19 - 32 mEq/L Final  . Glucose, Bld 05/09/2013 150* 70 - 99 mg/dL Final  . BUN 57/84/6962 13  6 - 23 mg/dL Final  . Creatinine, Ser 05/09/2013 0.8  0.4 - 1.2 mg/dL Final  . Total Bilirubin 05/09/2013 0.8  0.3 - 1.2 mg/dL Final  . Alkaline Phosphatase 05/09/2013 35* 39 - 117 U/L Final  . AST 05/09/2013 20  0 - 37 U/L Final  . ALT 05/09/2013 24  0 - 35 U/L Final  . Total Protein 05/09/2013 6.6  6.0 - 8.3 g/dL Final  . Albumin 95/28/4132 4.1  3.5 - 5.2 g/dL Final  . Calcium 44/09/270 9.4  8.4 - 10.5 mg/dL Final  . GFR 53/66/4403 81.73  >  60.00 mL/min Final  . Microalb, Ur 05/09/2013 1.5  0.0 - 1.9 mg/dL Final  . Creatinine,U 40/98/1191 57.3   Final  . Microalb Creat Ratio 05/09/2013 2.6  0.0 - 30.0 mg/g Final  . Cholesterol 05/09/2013 179  0 - 200 mg/dL Final   ATP III Classification       Desirable:  < 200 mg/dL               Borderline High:  200 - 239 mg/dL          High:  > = 478 mg/dL  . Triglycerides 05/09/2013 97.0  0.0 - 149.0 mg/dL Final   Normal:  <295 mg/dLBorderline High:  150 - 199 mg/dL  . HDL 05/09/2013 56.00  >39.00 mg/dL Final  . VLDL 62/13/0865 19.4  0.0 - 40.0 mg/dL Final  . LDL Cholesterol 05/09/2013 104* 0 - 99 mg/dL Final  . Total CHOL/HDL Ratio 05/09/2013 3   Final                  Men          Women1/2 Average Risk     3.4          3.3Average Risk          5.0          4.42X Average Risk          9.6          7.13X Average Risk          15.0           11.0                          Medication List       This list is accurate as of: 05/11/13 11:59 PM.  Always use your most recent med list.               azelastine 137 MCG/SPRAY nasal spray  Commonly known as:  ASTELIN  Place 1 spray into the nose 2 (two) times daily. Use in each nostril as directed     benazepril-hydrochlorthiazide 20-25 MG per tablet  Commonly known as:  LOTENSIN HCT  Take 1 tablet by mouth daily.     levothyroxine 50 MCG tablet  Commonly known as:  SYNTHROID, LEVOTHROID  Take 50 mcg by mouth daily before breakfast.     metFORMIN 500 MG tablet  Commonly known as:  GLUCOPHAGE  Take 500 mg by mouth 2 (two) times daily with a meal. 1 in am and 2 at night     pirbuterol 200 MCG/INH inhaler  Commonly known as:  MAXAIR  Inhale 2 puffs into the lungs 4 (four) times daily.     pravastatin 40 MG tablet  Commonly known as:  PRAVACHOL  Take 40 mg by mouth daily.        Allergies:  Allergies  Allergen Reactions  . Cozaar [Losartan Potassium]     Had knee swelling    Past Medical History  Diagnosis Date  . Uterine cancer     Past Surgical History  Procedure Laterality Date  . Cholecystectomy    . Abdominal hysterectomy    . Spine surgery      Family History  Problem Relation Age of Onset  . Heart disease Mother   . Heart disease Father     Social History:  reports that she has never smoked. She has never used smokeless tobacco.  Her alcohol and drug histories are not on file.  Review of Systems:  HYPERTENSION:  well-controlled with Lotensin HCT  HYPERLIPIDEMIA: The lipid abnormality consists of elevated LDL. This is a little over 100 now, she can do better with very fat and animal fats especially ice cream  Recent UTI     Examination:   BP 118/74  Pulse 99  Temp(Src) 98.1 F (36.7 C)  Resp 10  Ht 5\' 5"  (1.651 m)  Wt 183 lb 9.6 oz (83.28 kg)  BMI 30.55 kg/m2  SpO2 95%  Body mass index is 30.55 kg/(m^2).   Right lobe of  thyroid is 1-1/2 times normal, somewhat nodular, left side just palpable  Biceps reflexes normal  ASSESSMENT/ PLAN::   Diabetes type 2   The patient's diabetes control appears to be fairly good with upper normal A1c. She is doing well on metformin alone. Recently has been inconsistent with diet and discussed watching high fat foods and snacks Also discussed monitoring some readings after meals and bringing records for review on the next visit   HYPOTHYROIDISM: This is again mild and stable with normal TSH on 50 mcg. She is doing well subjectively also and has only a small goiter at present She will follow up again in 6 months  Chaunice Obie 05/18/2013, 2:18 PM

## 2013-05-18 ENCOUNTER — Encounter: Payer: Self-pay | Admitting: Endocrinology

## 2013-07-13 ENCOUNTER — Other Ambulatory Visit: Payer: Self-pay | Admitting: *Deleted

## 2013-07-13 ENCOUNTER — Telehealth: Payer: Self-pay | Admitting: Endocrinology

## 2013-07-13 MED ORDER — LEVOTHYROXINE SODIUM 50 MCG PO TABS: 50.0000 ug | ORAL_TABLET | Freq: Every day | ORAL | Status: DC

## 2013-07-13 NOTE — Telephone Encounter (Signed)
Says pharmacy has contacted [place] several times over 2 weeks to get thyroid meds refilled. She is completely out. Please call / Sherri

## 2013-11-07 ENCOUNTER — Other Ambulatory Visit (INDEPENDENT_AMBULATORY_CARE_PROVIDER_SITE_OTHER): Payer: Medicare Other

## 2013-11-07 DIAGNOSIS — E039 Hypothyroidism, unspecified: Secondary | ICD-10-CM

## 2013-11-07 LAB — T4, FREE: Free T4: 1.05 ng/dL (ref 0.60–1.60)

## 2013-11-07 LAB — TSH: TSH: 1.13 u[IU]/mL (ref 0.35–5.50)

## 2013-11-16 ENCOUNTER — Other Ambulatory Visit: Payer: Self-pay | Admitting: *Deleted

## 2013-11-16 ENCOUNTER — Encounter: Payer: Self-pay | Admitting: Endocrinology

## 2013-11-16 ENCOUNTER — Ambulatory Visit (INDEPENDENT_AMBULATORY_CARE_PROVIDER_SITE_OTHER): Payer: Medicare Other | Admitting: Endocrinology

## 2013-11-16 VITALS — BP 128/78 | HR 72 | Temp 98.5°F | Resp 16 | Ht 66.0 in | Wt 186.4 lb

## 2013-11-16 DIAGNOSIS — E785 Hyperlipidemia, unspecified: Secondary | ICD-10-CM

## 2013-11-16 DIAGNOSIS — I10 Essential (primary) hypertension: Secondary | ICD-10-CM

## 2013-11-16 DIAGNOSIS — E876 Hypokalemia: Secondary | ICD-10-CM

## 2013-11-16 DIAGNOSIS — E119 Type 2 diabetes mellitus without complications: Secondary | ICD-10-CM

## 2013-11-16 DIAGNOSIS — E039 Hypothyroidism, unspecified: Secondary | ICD-10-CM

## 2013-11-16 DIAGNOSIS — E1149 Type 2 diabetes mellitus with other diabetic neurological complication: Secondary | ICD-10-CM

## 2013-11-16 DIAGNOSIS — E1142 Type 2 diabetes mellitus with diabetic polyneuropathy: Secondary | ICD-10-CM

## 2013-11-16 DIAGNOSIS — F419 Anxiety disorder, unspecified: Secondary | ICD-10-CM

## 2013-11-16 HISTORY — DX: Hypokalemia: E87.6

## 2013-11-16 HISTORY — DX: Anxiety disorder, unspecified: F41.9

## 2013-11-16 LAB — LIPID PANEL
CHOLESTEROL: 174 mg/dL (ref 0–200)
HDL: 59 mg/dL (ref >39.00)
LDL Cholesterol: 82 mg/dL (ref 0–99)
TRIGLYCERIDES: 166 mg/dL — AB (ref 0.0–149.0)
Total CHOL/HDL Ratio: 3
VLDL: 33.2 mg/dL (ref 0.0–40.0)

## 2013-11-16 LAB — HEMOGLOBIN A1C: Hgb A1c MFr Bld: 6.6 % — ABNORMAL HIGH (ref 4.6–6.5)

## 2013-11-16 LAB — COMPREHENSIVE METABOLIC PANEL
ALK PHOS: 39 U/L (ref 39–117)
ALT: 25 U/L (ref 0–35)
AST: 22 U/L (ref 0–37)
Albumin: 4.1 g/dL (ref 3.5–5.2)
BILIRUBIN TOTAL: 0.6 mg/dL (ref 0.3–1.2)
BUN: 16 mg/dL (ref 6–23)
CO2: 30 meq/L (ref 19–32)
CREATININE: 0.8 mg/dL (ref 0.4–1.2)
Calcium: 9.4 mg/dL (ref 8.4–10.5)
Chloride: 100 mEq/L (ref 96–112)
GFR: 71.6 mL/min (ref >60.00)
GLUCOSE: 128 mg/dL — AB (ref 70–99)
Potassium: 3.2 mEq/L — ABNORMAL LOW (ref 3.5–5.1)
Sodium: 137 mEq/L (ref 135–145)
TOTAL PROTEIN: 6.4 g/dL (ref 6.0–8.3)

## 2013-11-16 LAB — MICROALBUMIN / CREATININE URINE RATIO
CREATININE, U: 69.3 mg/dL
Microalb Creat Ratio: 1.3 mg/g (ref 0.0–30.0)
Microalb, Ur: 0.9 mg/dL (ref 0.0–1.9)

## 2013-11-16 MED ORDER — GABAPENTIN 100 MG PO CAPS: 100.0000 mg | ORAL_CAPSULE | Freq: Three times a day (TID) | ORAL | Status: DC

## 2013-11-16 MED ORDER — GLUCOSE BLOOD VI STRP: ORAL_STRIP | Status: DC

## 2013-11-16 NOTE — Progress Notes (Signed)
Patient ID: Suzanne Sutton, female   DOB: 07-01-1946, 68 y.o.   MRN: [id number]   Reason for Appointment: Diabetes follow-up   History of Present Illness   Diagnosis: Type 2 DIABETES MELITUS, date of diagnosis:? 2007         Oral hypoglycemic drugs: Metformin        Side effects from medications: None.. She has had mild diabetes which has usually been well controlled with metformin alone and A1c is usually upper normal. A1c in previous lab had been in the range of 5.8-6.2 However she has difficulty losing weight and recently has not been exercising Not clear how often she is checking her post prandial readings Glucometer: Bayer          Blood Glucose readings from recall: 125 pcs, am 70-100          Physical activity: exercise: Some walking at work only Last consultation with dietitian: Unknown              Wt Readings from Last 3 Encounters:  11/16/13 186 lb 6.4 oz (84.55 kg)  05/11/13 183 lb 9.6 oz (83.28 kg)   Lab Results  Component Value Date   HGBA1C 6.6* 11/16/2013   HGBA1C 6.5 05/09/2013   Lab Results  Component Value Date   MICROALBUR 0.9 11/16/2013   LDLCALC 82 11/16/2013   CREATININE 0.8 11/16/2013    HYPOTHYROIDISM: The patient has had mild Hypothyroidism that was preceded by transient hyperthyroidism likely to be from autoimmune disease or thyroiditis Her TSH  been maintained in the normal range on low-dose levothyroxine 50 mcg since about 6/12.  She does not have any unusual fatigue or cold intolerance, has had fairly stable weight and is compliant with her medication Also on initial presentation she had a multinodular goiter which has been smaller subsequently  Labs:  Lab Results  Component Value Date   TSH 1.13 11/07/2013     Office Visit on 11/16/2013  Component Date Value Ref Range Status  . Microalb, Ur 11/16/2013 0.9  0.0 - 1.9 mg/dL Final  . Creatinine,U 11/16/2013 69.3   Final  . Microalb Creat Ratio 11/16/2013 1.3  0.0 - 30.0 mg/g Final  . Hemoglobin  A1C 11/16/2013 6.6* 4.6 - 6.5 % Final   Glycemic Control Guidelines for People with Diabetes:Non Diabetic:  <6%Goal of Therapy: <7%Additional Action Suggested:  >8%   . Sodium 11/16/2013 137  135 - 145 mEq/L Final  . Potassium 11/16/2013 3.2* 3.5 - 5.1 mEq/L Final  . Chloride 11/16/2013 100  96 - 112 mEq/L Final  . CO2 11/16/2013 30  19 - 32 mEq/L Final  . Glucose, Bld 11/16/2013 128* 70 - 99 mg/dL Final  . BUN 11/16/2013 16  6 - 23 mg/dL Final  . Creatinine, Ser 11/16/2013 0.8  0.4 - 1.2 mg/dL Final  . Total Bilirubin 11/16/2013 0.6  0.3 - 1.2 mg/dL Final  . Alkaline Phosphatase 11/16/2013 39  39 - 117 U/L Final  . AST 11/16/2013 22  0 - 37 U/L Final  . ALT 11/16/2013 25  0 - 35 U/L Final  . Total Protein 11/16/2013 6.4  6.0 - 8.3 g/dL Final  . Albumin 11/16/2013 4.1  3.5 - 5.2 g/dL Final  . Calcium 11/16/2013 9.4  8.4 - 10.5 mg/dL Final  . GFR 11/16/2013 71.60  >60.00 mL/min Final  . Cholesterol 11/16/2013 174  0 - 200 mg/dL Final   ATP III Classification       Desirable:  < 200 mg/dL  Borderline High:  200 - 239 mg/dL          High:  > = 240 mg/dL  . Triglycerides 11/16/2013 166.0* 0.0 - 149.0 mg/dL Final   Normal:  <150 mg/dLBorderline High:  150 - 199 mg/dL  . HDL 11/16/2013 59.00  >39.00 mg/dL Final  . VLDL 11/16/2013 33.2  0.0 - 40.0 mg/dL Final  . LDL Cholesterol 11/16/2013 82  0 - 99 mg/dL Final  . Total CHOL/HDL Ratio 11/16/2013 3   Final                  Men          Women1/2 Average Risk     3.4          3.3Average Risk          5.0          4.42X Average Risk          9.6          7.13X Average Risk          15.0          11.0                          Medication List       This list is accurate as of: 11/16/13 11:59 PM.  Always use your most recent med list.               azelastine 137 MCG/SPRAY nasal spray  Commonly known as:  ASTELIN  Place 1 spray into the nose 2 (two) times daily. Use in each nostril as directed     benazepril-hydrochlorthiazide  20-25 MG per tablet  Commonly known as:  LOTENSIN HCT  Take 1 tablet by mouth daily.     clonazePAM 0.5 MG tablet  Commonly known as:  KLONOPIN  Take 0.5 mg by mouth as needed for anxiety.     gabapentin 100 MG capsule  Commonly known as:  NEURONTIN  Take 1 capsule (100 mg total) by mouth 3 (three) times daily.     glucose blood test strip  Commonly known as:  FREESTYLE TEST STRIPS  Use as instructed to check blood sugars 1 time a day dx code 250.00     levothyroxine 50 MCG tablet  Commonly known as:  SYNTHROID, LEVOTHROID  Take 1 tablet (50 mcg total) by mouth daily before breakfast.     metFORMIN 500 MG tablet  Commonly known as:  GLUCOPHAGE  Take 500 mg by mouth 2 (two) times daily with a meal. 1 in am and 2 at night     pirbuterol 200 MCG/INH inhaler  Commonly known as:  MAXAIR  Inhale 2 puffs into the lungs 4 (four) times daily.     pravastatin 40 MG tablet  Commonly known as:  PRAVACHOL  Take 40 mg by mouth daily.        Allergies:  Allergies  Allergen Reactions  . Cozaar [Losartan Potassium]     Had knee swelling    Past Medical History  Diagnosis Date  . Uterine cancer     Past Surgical History  Procedure Laterality Date  . Cholecystectomy    . Abdominal hysterectomy    . Spine surgery      Family History  Problem Relation Age of Onset  . Heart disease Mother   . Heart disease Father     Social History:  reports that she has  never smoked. She has never used smokeless tobacco. Her alcohol and drug histories are not on file.  Review of Systems:  HYPERTENSION:  well-controlled with Lotensin HCT, does not monitor at home No history of microalbuminuria  HYPERLIPIDEMIA: The lipid abnormality consists of elevated LDL. LDL is improved from the last visit and she continues to take pravastatin. Was advised on cutting back on dairy fat on the last visit  Some burning and stinging in feet later in the day if standing for long or walking much. No  numbness in her toes    Examination:   BP 128/78  Pulse 72  Temp(Src) 98.5 F (36.9 C)  Resp 16  Ht 5\' 6"  (1.676 m)  Wt 186 lb 6.4 oz (84.55 kg)  BMI 30.10 kg/m2  SpO2 97%  Body mass index is 30.1 kg/(m^2).   Small nodular right lobe felt   diabetic foot exam done  ASSESSMENT/ PLAN:   Diabetes type 2   The patient's diabetes control appears to be fairly good with upper normal A1c. She is doing well on metformin alone. Recently has gained a little weight and has not been exercising Also not clear if she is checking enough postprandial readings some: Discussed needing to bring her monitor on next visit Also discussed starting a regular walking program  NEUROPATHY: She appears to have symptoms in her feet without any objective sensory loss. Most likely this is related to her diabetes Will empirically start her on gabapentin low-dose and she can adjust the dose as needed. Discussed neuropathy, foot care, use of gabapentin and possible side effects  HYPOTHYROIDISM: This is again mild and stable with normal TSH on 50 mcg.  She is doing well subjectively also and has only a small goiter at present; this was felt to be a multinodular goiter on previous evaluation  Hyperlipidemia: Much better controlled recently and she needs to continue heart healthy diet  She will follow up again in 6 months  Counseling time over 50% of today's 25 minute visit  Suzanne Sutton 11/17/2013, 3:26 PM

## 2013-11-17 NOTE — Patient Instructions (Signed)
Please check blood sugars at least half the time about 2 hours after any meal and as directed on waking up. Please bring blood sugar monitor to each visit  Start regular walking, at least 15-30 minutes, 3 times a week

## 2013-12-05 ENCOUNTER — Encounter: Payer: Self-pay | Admitting: *Deleted

## 2013-12-05 ENCOUNTER — Other Ambulatory Visit: Payer: Self-pay | Admitting: *Deleted

## 2013-12-05 MED ORDER — POTASSIUM CHLORIDE CRYS ER 20 MEQ PO TBCR: 20.0000 meq | EXTENDED_RELEASE_TABLET | Freq: Every day | ORAL | Status: DC

## 2014-01-11 ENCOUNTER — Other Ambulatory Visit: Payer: Self-pay

## 2014-01-11 MED ORDER — LEVOTHYROXINE SODIUM 50 MCG PO TABS: 50.0000 ug | ORAL_TABLET | Freq: Every day | ORAL | Status: DC

## 2014-02-13 ENCOUNTER — Other Ambulatory Visit: Payer: Self-pay | Admitting: *Deleted

## 2014-02-13 MED ORDER — LEVOTHYROXINE SODIUM 50 MCG PO TABS: 50.0000 ug | ORAL_TABLET | Freq: Every day | ORAL | Status: DC

## 2014-05-15 ENCOUNTER — Other Ambulatory Visit: Payer: Self-pay | Admitting: *Deleted

## 2014-05-15 ENCOUNTER — Other Ambulatory Visit (INDEPENDENT_AMBULATORY_CARE_PROVIDER_SITE_OTHER): Payer: Medicare Other

## 2014-05-15 DIAGNOSIS — E039 Hypothyroidism, unspecified: Secondary | ICD-10-CM

## 2014-05-15 DIAGNOSIS — E119 Type 2 diabetes mellitus without complications: Secondary | ICD-10-CM

## 2014-05-15 LAB — TSH: TSH: 0.93 u[IU]/mL (ref 0.35–4.50)

## 2014-05-15 LAB — LIPID PANEL
CHOL/HDL RATIO: 3
Cholesterol: 175 mg/dL (ref 0–200)
HDL: 56.5 mg/dL (ref >39.00)
LDL CALC: 84 mg/dL (ref 0–99)
NONHDL: 118.5
TRIGLYCERIDES: 174 mg/dL — AB (ref 0.0–149.0)
VLDL: 34.8 mg/dL (ref 0.0–40.0)

## 2014-05-15 LAB — COMPREHENSIVE METABOLIC PANEL
ALK PHOS: 39 U/L (ref 39–117)
ALT: 28 U/L (ref 0–35)
AST: 23 U/L (ref 0–37)
Albumin: 4.2 g/dL (ref 3.5–5.2)
BILIRUBIN TOTAL: 0.7 mg/dL (ref 0.2–1.2)
BUN: 13 mg/dL (ref 6–23)
CO2: 29 mEq/L (ref 19–32)
CREATININE: 0.9 mg/dL (ref 0.4–1.2)
Calcium: 9.4 mg/dL (ref 8.4–10.5)
Chloride: 97 mEq/L (ref 96–112)
GFR: 70.53 mL/min (ref >60.00)
Glucose, Bld: 160 mg/dL — ABNORMAL HIGH (ref 70–99)
Potassium: 3.1 mEq/L — ABNORMAL LOW (ref 3.5–5.1)
Sodium: 135 mEq/L (ref 135–145)
Total Protein: 6.8 g/dL (ref 6.0–8.3)

## 2014-05-15 LAB — T4, FREE: Free T4: 1.12 ng/dL (ref 0.60–1.60)

## 2014-05-15 LAB — HEMOGLOBIN A1C: Hgb A1c MFr Bld: 6.6 % — ABNORMAL HIGH (ref 4.6–6.5)

## 2014-05-17 ENCOUNTER — Encounter: Payer: Self-pay | Admitting: Endocrinology

## 2014-05-17 ENCOUNTER — Ambulatory Visit (INDEPENDENT_AMBULATORY_CARE_PROVIDER_SITE_OTHER): Payer: Medicare Other | Admitting: Endocrinology

## 2014-05-17 VITALS — BP 134/82 | HR 80 | Temp 98.1°F | Resp 14 | Ht 66.0 in | Wt 185.2 lb

## 2014-05-17 DIAGNOSIS — I10 Essential (primary) hypertension: Secondary | ICD-10-CM

## 2014-05-17 DIAGNOSIS — E039 Hypothyroidism, unspecified: Secondary | ICD-10-CM

## 2014-05-17 DIAGNOSIS — E1142 Type 2 diabetes mellitus with diabetic polyneuropathy: Secondary | ICD-10-CM

## 2014-05-17 DIAGNOSIS — E119 Type 2 diabetes mellitus without complications: Secondary | ICD-10-CM

## 2014-05-17 NOTE — Progress Notes (Signed)
Patient ID: Suzanne Sutton, female   DOB: 17-Nov-1945, 68 y.o.   MRN: [id number]   Reason for Appointment: Diabetes follow-up   History of Present Illness   Diagnosis: Type 2 DIABETES MELITUS, date of diagnosis:? 2007         Oral hypoglycemic drugs: Metformin        Side effects from medications: None.. She has had mild diabetes which has usually been well controlled with metformin alone and A1c is usually upper normal. A1c in [place] lab had been in the range of 5.8-6.2  She is checking her blood sugars at various times but does not bring her meter for review She again has difficulty losing weight. She thinks that recently with traveling and being on vacation she has not been consistent with her diet Also her lab work her glucose was 145, relatively higher than at home and may have had something sweet She recently has been trying to resume walking about 20 minutes Not clear how often she is checking her post prandial readings  Glucometer: Bayer          Blood Glucose readings from recall: Pc 120-130, fasting 98-107         Last consultation with dietitian: Unknown              Wt Readings from Last 3 Encounters:  05/17/14 185 lb 3.2 oz (84.006 kg)  11/16/13 186 lb 6.4 oz (84.55 kg)  05/11/13 183 lb 9.6 oz (83.28 kg)   Lab Results  Component Value Date   HGBA1C 6.6* 05/15/2014   HGBA1C 6.6* 11/16/2013   HGBA1C 6.5 05/09/2013   Lab Results  Component Value Date   MICROALBUR 0.9 11/16/2013   LDLCALC 84 05/15/2014   CREATININE 0.9 05/15/2014    HYPOTHYROIDISM: The patient has had mild Hypothyroidism that was preceded by transient hyperthyroidism likely to be from autoimmune disease or thyroiditis Her TSH  been maintained in the normal range on low-dose levothyroxine 50 mcg since about 6/12.  She does not have any unusual fatigue, has had fairly stable weight and is compliant with her Synthroid every morning Also on initial presentation she had a multinodular goiter which has been smaller  and stable subsequently  Labs:  Lab Results  Component Value Date   TSH 0.93 05/15/2014     Appointment on 05/15/2014  Component Date Value Ref Range Status  . Hemoglobin A1C 05/15/2014 6.6* 4.6 - 6.5 % Final   Glycemic Control Guidelines for People with Diabetes:Non Diabetic:  <6%Goal of Therapy: <7%Additional Action Suggested:  >8%   . Sodium 05/15/2014 135  135 - 145 mEq/L Final  . Potassium 05/15/2014 3.1* 3.5 - 5.1 mEq/L Final  . Chloride 05/15/2014 97  96 - 112 mEq/L Final  . CO2 05/15/2014 29  19 - 32 mEq/L Final  . Glucose, Bld 05/15/2014 160* 70 - 99 mg/dL Final  . BUN 05/15/2014 13  6 - 23 mg/dL Final  . Creatinine, Ser 05/15/2014 0.9  0.4 - 1.2 mg/dL Final  . Total Bilirubin 05/15/2014 0.7  0.2 - 1.2 mg/dL Final  . Alkaline Phosphatase 05/15/2014 39  39 - 117 U/L Final  . AST 05/15/2014 23  0 - 37 U/L Final  . ALT 05/15/2014 28  0 - 35 U/L Final  . Total Protein 05/15/2014 6.8  6.0 - 8.3 g/dL Final  . Albumin 05/15/2014 4.2  3.5 - 5.2 g/dL Final  . Calcium 05/15/2014 9.4  8.4 - 10.5 mg/dL Final  . GFR 05/15/2014  70.53  >60.00 mL/min Final  . Cholesterol 05/15/2014 175  0 - 200 mg/dL Final   ATP III Classification       Desirable:  < 200 mg/dL               Borderline High:  200 - 239 mg/dL          High:  > = 240 mg/dL  . Triglycerides 05/15/2014 174.0* 0.0 - 149.0 mg/dL Final   Normal:  <150 mg/dLBorderline High:  150 - 199 mg/dL  . HDL 05/15/2014 56.50  >39.00 mg/dL Final  . VLDL 05/15/2014 34.8  0.0 - 40.0 mg/dL Final  . LDL Cholesterol 05/15/2014 84  0 - 99 mg/dL Final  . Total CHOL/HDL Ratio 05/15/2014 3   Final                  Men          Women1/2 Average Risk     3.4          3.3Average Risk          5.0          4.42X Average Risk          9.6          7.13X Average Risk          15.0          11.0                      . NonHDL 05/15/2014 118.50   Final   NOTE:  Non-HDL goal should be 30 mg/dL higher than patient's LDL goal (i.e. LDL goal of < 70 mg/dL, would  have non-HDL goal of < 100 mg/dL)  . TSH 05/15/2014 0.93  0.35 - 4.50 uIU/mL Final  . Free T4 05/15/2014 1.12  0.60 - 1.60 ng/dL Final      Medication List       This list is accurate as of: 05/17/14  1:10 PM.  Always use your most recent med list.               azelastine 0.1 % nasal spray  Commonly known as:  ASTELIN  Place 1 spray into the nose 2 (two) times daily. Use in each nostril as directed     benazepril-hydrochlorthiazide 20-25 MG per tablet  Commonly known as:  LOTENSIN HCT  Take 1 tablet by mouth daily.     clonazePAM 0.5 MG tablet  Commonly known as:  KLONOPIN  Take 0.5 mg by mouth as needed for anxiety.     gabapentin 100 MG capsule  Commonly known as:  NEURONTIN  Take 1 capsule (100 mg total) by mouth 3 (three) times daily.     glucose blood test strip  Commonly known as:  FREESTYLE TEST STRIPS  Use as instructed to check blood sugars 1 time a day dx code 250.00     levothyroxine 50 MCG tablet  Commonly known as:  SYNTHROID, LEVOTHROID  Take 1 tablet (50 mcg total) by mouth daily before breakfast.     metFORMIN 500 MG tablet  Commonly known as:  GLUCOPHAGE  Take 500 mg by mouth 2 (two) times daily with a meal. 1 in am and 2 at night     pirbuterol 200 MCG/INH inhaler  Commonly known as:  MAXAIR  Inhale 2 puffs into the lungs 4 (four) times daily.     potassium chloride SA 20 MEQ tablet  Commonly  known as:  K-DUR,KLOR-CON  Take 1 tablet (20 mEq total) by mouth daily.     pravastatin 40 MG tablet  Commonly known as:  PRAVACHOL  Take 40 mg by mouth daily.        Allergies:  Allergies  Allergen Reactions  . Cozaar [Losartan Potassium]     Had knee swelling  . Crestor [Rosuvastatin]     Leg swelling    Past Medical History  Diagnosis Date  . Uterine cancer     Past Surgical History  Procedure Laterality Date  . Cholecystectomy    . Abdominal hysterectomy    . Spine surgery      Family History  Problem Relation Age of Onset  .  Heart disease Mother   . Heart disease Father     Social History:  reports that she has never smoked. She has never used smokeless tobacco. Her alcohol and drug histories are not on file.  Review of Systems:  HYPERTENSION:  well-controlled with Lotensin HCT, does not monitor at home No history of microalbuminuria Stopped taking potassium and her level is again low, some cramps occurring recently  HYPERLIPIDEMIA: The lipid abnormality consists of elevated LDL. LDL is at target and she continues to take pravastatin.  HCl also in good range  Lab Results  Component Value Date   CHOL 175 05/15/2014   HDL 56.50 05/15/2014   LDLCALC 84 05/15/2014   TRIG 174.0* 05/15/2014   CHOLHDL 3 05/15/2014    Some burning and stinging in feet occur later in the day if standing for long or walking much. No numbness in her feet Gabapentin helps but reduces her ability to concentrate and she does not always take it while going to work  Last diabetic foot exam done in 3/15   Examination:   BP 134/82  Pulse 80  Temp(Src) 98.1 F (36.7 C)  Resp 14  Ht 5\' 6"  (1.676 m)  Wt 185 lb 3.2 oz (84.006 kg)  BMI 29.91 kg/m2  SpO2 97%  Body mass index is 29.91 kg/(m^2).    No edema   ASSESSMENT/ PLAN:   Diabetes type 2   The patient's diabetes control appears to be fairly good with upper normal A1c. She is doing well on metformin alone. Recently has gained a little weight and has not been exercising Also not clear if she is checking enough postprandial readings some: Discussed needing to bring her monitor on next visit Also discussed starting a regular walking program  NEUROPATHY: She has had symptoms in her feet without any objective sensory loss.  She can take gabapentin as needed  HYPOTHYROIDISM: This is mild and stable with normal TSH on 50 mcg.  She is doing well subjectively also and has only a small goiter at present; this was felt to be a multinodular goiter on previous evaluation  Hyperlipidemia:  Much better controlled recently and she needs to continue heart healthy diet  She will follow up again in 6 months   Gerda Yin 05/17/2014, 1:10 PM

## 2014-07-03 ENCOUNTER — Other Ambulatory Visit: Payer: Self-pay | Admitting: Endocrinology

## 2014-07-10 ENCOUNTER — Other Ambulatory Visit: Payer: Self-pay | Admitting: Endocrinology

## 2014-11-08 ENCOUNTER — Other Ambulatory Visit (INDEPENDENT_AMBULATORY_CARE_PROVIDER_SITE_OTHER): Payer: BLUE CROSS/BLUE SHIELD

## 2014-11-08 DIAGNOSIS — E039 Hypothyroidism, unspecified: Secondary | ICD-10-CM

## 2014-11-08 DIAGNOSIS — E119 Type 2 diabetes mellitus without complications: Secondary | ICD-10-CM

## 2014-11-08 LAB — COMPREHENSIVE METABOLIC PANEL
ALK PHOS: 41 U/L (ref 39–117)
ALT: 24 U/L (ref 0–35)
AST: 20 U/L (ref 0–37)
Albumin: 4.4 g/dL (ref 3.5–5.2)
BUN: 13 mg/dL (ref 6–23)
CO2: 34 mEq/L — ABNORMAL HIGH (ref 19–32)
CREATININE: 0.86 mg/dL (ref 0.40–1.20)
Calcium: 9.8 mg/dL (ref 8.4–10.5)
Chloride: 100 mEq/L (ref 96–112)
GFR: 69.48 mL/min (ref >60.00)
GLUCOSE: 171 mg/dL — AB (ref 70–99)
Potassium: 3.8 mEq/L (ref 3.5–5.1)
Sodium: 139 mEq/L (ref 135–145)
Total Bilirubin: 0.7 mg/dL (ref 0.2–1.2)
Total Protein: 6.7 g/dL (ref 6.0–8.3)

## 2014-11-08 LAB — HEMOGLOBIN A1C: Hgb A1c MFr Bld: 6.6 % — ABNORMAL HIGH (ref 4.6–6.5)

## 2014-11-08 LAB — TSH: TSH: 2.04 u[IU]/mL (ref 0.35–4.50)

## 2014-11-13 ENCOUNTER — Other Ambulatory Visit: Payer: Medicare Other

## 2014-11-15 ENCOUNTER — Other Ambulatory Visit: Payer: Self-pay | Admitting: *Deleted

## 2014-11-15 ENCOUNTER — Encounter: Payer: Self-pay | Admitting: Endocrinology

## 2014-11-15 ENCOUNTER — Ambulatory Visit (INDEPENDENT_AMBULATORY_CARE_PROVIDER_SITE_OTHER): Payer: BLUE CROSS/BLUE SHIELD | Admitting: Endocrinology

## 2014-11-15 VITALS — BP 129/73 | HR 77 | Temp 97.8°F | Resp 16 | Ht 66.0 in | Wt 182.0 lb

## 2014-11-15 DIAGNOSIS — E1142 Type 2 diabetes mellitus with diabetic polyneuropathy: Secondary | ICD-10-CM

## 2014-11-15 DIAGNOSIS — IMO0002 Reserved for concepts with insufficient information to code with codable children: Secondary | ICD-10-CM

## 2014-11-15 DIAGNOSIS — E063 Autoimmune thyroiditis: Secondary | ICD-10-CM

## 2014-11-15 DIAGNOSIS — G629 Polyneuropathy, unspecified: Secondary | ICD-10-CM

## 2014-11-15 DIAGNOSIS — E876 Hypokalemia: Secondary | ICD-10-CM

## 2014-11-15 DIAGNOSIS — E038 Other specified hypothyroidism: Secondary | ICD-10-CM

## 2014-11-15 DIAGNOSIS — E785 Hyperlipidemia, unspecified: Secondary | ICD-10-CM

## 2014-11-15 DIAGNOSIS — E1165 Type 2 diabetes mellitus with hyperglycemia: Secondary | ICD-10-CM

## 2014-11-15 DIAGNOSIS — I10 Essential (primary) hypertension: Secondary | ICD-10-CM

## 2014-11-15 MED ORDER — GLUCOSE BLOOD VI STRP: ORAL_STRIP | Status: DC

## 2014-11-15 NOTE — Patient Instructions (Signed)
Please check blood sugars at least half the time about 2 hours after any meal and 3 times per week on waking up. Please bring blood sugar monitor to each visit. Recommended blood sugar levels about 2 hours after meal is 140-180 and on waking up 90-130  Start walking

## 2014-11-15 NOTE — Progress Notes (Signed)
Patient ID: Suzanne Sutton, female   DOB: 19-Jun-1946, 69 y.o.   MRN: [id number]   Reason for Appointment:  follow-up of various issues   History of Present Illness   Diagnosis: Type 2 DIABETES MELITUS, date of diagnosis:? 2007         Oral hypoglycemic drugs: Metformin 2 or 3 daily       Side effects from medications: None.. She has had mild diabetes which has usually been well controlled with metformin alone and A1c is usually upper normal. A1c in [place] lab had been in the range of 5.8-6.2  Recent history: She is supposed to be taking 1500 mg of metformin for her diabetes but she will sometimes take only 2 tablets a day if she is eating a light meal in the evening which is every other day or so She thinks her sugars may have been higher at times recently because of a respiratory infection However she is using expired test strips Again checking blood sugar somewhat infrequently Has not been exercising this winter although her weight has been slightly better Usually not eating excessive carbohydrate especially at breakfast but her blood glucose after breakfast in the lab was 171  A1c is still about same at 6.6  Glucometer: Bayer contour.  Her test strips expired in 5/15           Blood Glucose readings from download : Range 91-197 with only 6 readings in the last month and has relatively high readings at bedtime or around 6 PM and no fasting readings         Last consultation with dietitian: Unknown              Wt Readings from Last 3 Encounters:  11/15/14 182 lb (82.555 kg)  05/17/14 185 lb 3.2 oz (84.006 kg)  11/16/13 186 lb 6.4 oz (84.55 kg)   Lab Results  Component Value Date   HGBA1C 6.6* 11/08/2014   HGBA1C 6.6* 05/15/2014   HGBA1C 6.6* 11/16/2013   Lab Results  Component Value Date   MICROALBUR 0.9 11/16/2013   LDLCALC 84 05/15/2014   CREATININE 0.86 11/08/2014    HYPOTHYROIDISM:  The patient has had mild Hypothyroidism that was preceded by transient  hyperthyroidism likely to be from autoimmune disease/thyroiditis Her TSH  been maintained in the normal range on low-dose levothyroxine 50 mcg since about 6/12.  She does not have any unusual fatigue She is compliant with her Synthroid every morning  Also on initial presentation she had a multinodular goiter which has been smaller and stable subsequently  Labs:  Lab Results  Component Value Date   TSH 2.04 11/08/2014     No visits with results within 1 Week(s) from this visit. Latest known visit with results is:  Appointment on 11/08/2014  Component Date Value Ref Range Status  . Hgb A1c MFr Bld 11/08/2014 6.6* 4.6 - 6.5 % Final   Glycemic Control Guidelines for People with Diabetes:Non Diabetic:  <6%Goal of Therapy: <7%Additional Action Suggested:  >8%   . Sodium 11/08/2014 139  135 - 145 mEq/L Final  . Potassium 11/08/2014 3.8  3.5 - 5.1 mEq/L Final  . Chloride 11/08/2014 100  96 - 112 mEq/L Final  . CO2 11/08/2014 34* 19 - 32 mEq/L Final  . Glucose, Bld 11/08/2014 171* 70 - 99 mg/dL Final  . BUN 11/08/2014 13  6 - 23 mg/dL Final  . Creatinine, Ser 11/08/2014 0.86  0.40 - 1.20 mg/dL Final  . Total Bilirubin 11/08/2014 0.7  0.2 - 1.2 mg/dL Final  . Alkaline Phosphatase 11/08/2014 41  39 - 117 U/L Final  . AST 11/08/2014 20  0 - 37 U/L Final  . ALT 11/08/2014 24  0 - 35 U/L Final  . Total Protein 11/08/2014 6.7  6.0 - 8.3 g/dL Final  . Albumin 11/08/2014 4.4  3.5 - 5.2 g/dL Final  . Calcium 11/08/2014 9.8  8.4 - 10.5 mg/dL Final  . GFR 11/08/2014 69.48  >60.00 mL/min Final  . TSH 11/08/2014 2.04  0.35 - 4.50 uIU/mL Final      Medication List       This list is accurate as of: 11/15/14  3:12 PM.  Always use your most recent med list.               azelastine 0.1 % nasal spray  Commonly known as:  ASTELIN  Place 1 spray into the nose 2 (two) times daily. Use in each nostril as directed     benazepril-hydrochlorthiazide 20-25 MG per tablet  Commonly known as:   LOTENSIN HCT  Take 1 tablet by mouth daily.     clonazePAM 0.5 MG tablet  Commonly known as:  KLONOPIN  Take 0.5 mg by mouth as needed for anxiety.     gabapentin 100 MG capsule  Commonly known as:  NEURONTIN  TAKE 1 CAPSULE (100 MG TOTAL) BY MOUTH 3 (THREE) TIMES DAILY.     glucose blood test strip  Commonly known as:  FREESTYLE TEST STRIPS  Use as instructed to check blood sugars 1 time a day dx code 250.00     levothyroxine 50 MCG tablet  Commonly known as:  SYNTHROID, LEVOTHROID  Take 1 tablet (50 mcg total) by mouth daily before breakfast.     metFORMIN 500 MG tablet  Commonly known as:  GLUCOPHAGE  Take 500 mg by mouth 2 (two) times daily with a meal. 1 in am and 2 at night     pirbuterol 200 MCG/INH inhaler  Commonly known as:  MAXAIR  Inhale 2 puffs into the lungs 4 (four) times daily.     potassium chloride SA 20 MEQ tablet  Commonly known as:  K-DUR,KLOR-CON  Take 1 tablet (20 mEq total) by mouth daily.     pravastatin 40 MG tablet  Commonly known as:  PRAVACHOL  Take 40 mg by mouth daily.        Allergies:  Allergies  Allergen Reactions  . Cozaar [Losartan Potassium]     Had knee swelling  . Crestor [Rosuvastatin]     Leg swelling    Past Medical History  Diagnosis Date  . Uterine cancer     Past Surgical History  Procedure Laterality Date  . Cholecystectomy    . Abdominal hysterectomy    . Spine surgery      Family History  Problem Relation Age of Onset  . Heart disease Mother   . Heart disease Father     Social History:  reports that she has never smoked. She has never used smokeless tobacco. Her alcohol and drug histories are not on file.  Review of Systems:  HYPERTENSION:  well-controlled with Lotensin HCT, does not monitor at home No history of microalbuminuria Is taking potassium irregularly but still her level is ok on this visit, previously low  HYPERLIPIDEMIA: The lipid abnormality consists of elevated LDL. LDL is at target  and she continues to take pravastatin.  HDL also in good range with minimal increase in triglycerides  Lab Results  Component Value Date   CHOL 175 05/15/2014   HDL 56.50 05/15/2014   LDLCALC 84 05/15/2014   TRIG 174.0* 05/15/2014   CHOLHDL 3 05/15/2014    Some burning and stinging in feet occur later in the day if standing for long or walking much. No numbness in her feet Gabapentin helps control her symptoms but she does not need this daily  Last diabetic foot exam done in 3/16   Examination:   BP 129/73 mmHg  Pulse 77  Temp(Src) 97.8 F (36.6 C)  Resp 16  Ht 5\' 6"  (1.676 m)  Wt 182 lb (82.555 kg)  BMI 29.39 kg/m2  SpO2 96%  Body mass index is 29.39 kg/(m^2).    No edema  Diabetic foot exam shows normal monofilament sensation in the toes and plantar surfaces, no skin lesions or ulcers on the feet and normal pedal pulses  Thyroid is barely palpable on the right side, firm and mostly felt on swallowing.  Left lobe not clearly palpable   ASSESSMENT/ PLAN:   Diabetes type 2   The patient's diabetes control appears to be fairly good with upper normal A1c. However she appears to have postprandial hyperglycemia but some readings as high as 190 at home She is doing well on metformin alone but now she reveals that she is not taking the full dose regularly and generally taking 1000 mg a day. Recently has not been exercising because of the weather and this may be influencing her level of control She generally has been watching her diet with portion control Home glucose monitoring: She is doing the glucose monitoring with a  Contour meter  but is using expired test strips which was found out today Discussed timing of glucose monitoring, blood sugar targets Also discussed starting a regular walking program  NEUROPATHY: She has had symptoms in her feet without any objective sensory loss.  She can take gabapentin as needed Most likely should benefit from better diabetes  control  HYPOTHYROIDISM: This is mild and stable with normal TSH on 50 mcg.  She is doing well subjectively also and has only a barely palpable goiter at present; this was felt to be a multinodular goiter on previous evaluation  Hyperlipidemia: Well controlled as of 9/15  HYPERTENSION: Well controlled Hypokalemia improved with taking potassium supplements although she has been irregular with this.  Advised her to take them consistently  She will follow up in 4 months to make sure her glucose control is adequate  Counseling time over 50% of today's 25 minute visit   Kennedy Bohanon 11/15/2014, 3:12 PM

## 2014-11-20 ENCOUNTER — Telehealth: Payer: Self-pay | Admitting: Endocrinology

## 2014-11-20 NOTE — Telephone Encounter (Signed)
Pt wants to [facility] her labs results and wants a copy

## 2014-11-20 NOTE — Telephone Encounter (Signed)
a1c 6.6, send copy, let her know about my chart

## 2014-11-20 NOTE — Telephone Encounter (Signed)
Please see below and advise.

## 2014-12-16 ENCOUNTER — Other Ambulatory Visit: Payer: Self-pay | Admitting: *Deleted

## 2014-12-16 ENCOUNTER — Telehealth: Payer: Self-pay | Admitting: Endocrinology

## 2014-12-16 MED ORDER — GLUCOSE BLOOD VI STRP: ORAL_STRIP | Status: DC

## 2014-12-16 NOTE — Telephone Encounter (Signed)
rx sent

## 2014-12-16 NOTE — Telephone Encounter (Signed)
Pt transferred control next tester strips to [facility] and they said the need a diagnose with the prescription to fill [facility] said they were unable to give it [facility]

## 2014-12-24 ENCOUNTER — Other Ambulatory Visit: Payer: Self-pay | Admitting: Endocrinology

## 2014-12-25 ENCOUNTER — Other Ambulatory Visit: Payer: Self-pay

## 2014-12-25 MED ORDER — GLUCOSE BLOOD VI STRP: ORAL_STRIP | Status: AC

## 2015-01-15 ENCOUNTER — Other Ambulatory Visit: Payer: Self-pay | Admitting: Endocrinology

## 2015-03-19 ENCOUNTER — Other Ambulatory Visit: Payer: BLUE CROSS/BLUE SHIELD

## 2015-03-21 ENCOUNTER — Ambulatory Visit: Payer: BLUE CROSS/BLUE SHIELD | Admitting: Endocrinology

## 2015-05-28 ENCOUNTER — Other Ambulatory Visit (INDEPENDENT_AMBULATORY_CARE_PROVIDER_SITE_OTHER): Payer: BLUE CROSS/BLUE SHIELD

## 2015-05-28 DIAGNOSIS — IMO0002 Reserved for concepts with insufficient information to code with codable children: Secondary | ICD-10-CM

## 2015-05-28 DIAGNOSIS — E1165 Type 2 diabetes mellitus with hyperglycemia: Secondary | ICD-10-CM

## 2015-05-28 LAB — URINALYSIS, ROUTINE W REFLEX MICROSCOPIC
Bilirubin Urine: NEGATIVE
HGB URINE DIPSTICK: NEGATIVE
KETONES UR: NEGATIVE
Leukocytes, UA: NEGATIVE
Nitrite: NEGATIVE
RBC / HPF: NONE SEEN (ref 0–?)
Specific Gravity, Urine: 1.01 (ref 1.000–1.030)
Total Protein, Urine: NEGATIVE
UROBILINOGEN UA: 0.2 (ref 0.0–1.0)
Urine Glucose: NEGATIVE
pH: 6 (ref 5.0–8.0)

## 2015-05-28 LAB — COMPREHENSIVE METABOLIC PANEL
ALT: 20 U/L (ref 0–35)
AST: 19 U/L (ref 0–37)
Albumin: 4.1 g/dL (ref 3.5–5.2)
Alkaline Phosphatase: 41 U/L (ref 39–117)
BUN: 18 mg/dL (ref 6–23)
CHLORIDE: 100 meq/L (ref 96–112)
CO2: 34 meq/L — AB (ref 19–32)
CREATININE: 0.86 mg/dL (ref 0.40–1.20)
Calcium: 9.5 mg/dL (ref 8.4–10.5)
GFR: 69.37 mL/min (ref >60.00)
Glucose, Bld: 152 mg/dL — ABNORMAL HIGH (ref 70–99)
Potassium: 3.8 mEq/L (ref 3.5–5.1)
SODIUM: 139 meq/L (ref 135–145)
Total Bilirubin: 0.5 mg/dL (ref 0.2–1.2)
Total Protein: 6.6 g/dL (ref 6.0–8.3)

## 2015-05-28 LAB — HEMOGLOBIN A1C: Hgb A1c MFr Bld: 6.5 % (ref 4.6–6.5)

## 2015-05-30 ENCOUNTER — Encounter: Payer: Self-pay | Admitting: Endocrinology

## 2015-05-30 ENCOUNTER — Other Ambulatory Visit: Payer: Self-pay | Admitting: *Deleted

## 2015-05-30 ENCOUNTER — Ambulatory Visit (INDEPENDENT_AMBULATORY_CARE_PROVIDER_SITE_OTHER): Payer: BLUE CROSS/BLUE SHIELD | Admitting: Endocrinology

## 2015-05-30 VITALS — BP 128/84 | HR 100 | Temp 99.1°F | Resp 16 | Ht 66.0 in | Wt 181.8 lb

## 2015-05-30 DIAGNOSIS — E119 Type 2 diabetes mellitus without complications: Secondary | ICD-10-CM

## 2015-05-30 DIAGNOSIS — E063 Autoimmune thyroiditis: Secondary | ICD-10-CM

## 2015-05-30 DIAGNOSIS — E038 Other specified hypothyroidism: Secondary | ICD-10-CM

## 2015-05-30 MED ORDER — POTASSIUM CHLORIDE CRYS ER 20 MEQ PO TBCR: 20.0000 meq | EXTENDED_RELEASE_TABLET | Freq: Every day | ORAL | Status: DC

## 2015-05-30 MED ORDER — GABAPENTIN 100 MG PO CAPS: ORAL_CAPSULE | ORAL | Status: DC

## 2015-05-30 NOTE — Progress Notes (Signed)
Patient ID: Suzanne Sutton, female   DOB: 04-19-1946, 68 y.o.   MRN: [id number]   Reason for Appointment:  follow-up of various issues   History of Present Illness   Diagnosis: Type 2 DIABETES MELITUS, date of diagnosis:? 2007         Oral hypoglycemic drugs: Metformin 2 or 3 daily       Side effects from medications: None.. She has had mild diabetes which has usually been well controlled with metformin alone and A1c is usually upper normal. A1c in [place] lab had been in the range of 5.8-6.2  Recent history: She has good control on metformin alone Again checking blood sugar somewhat infrequently Checking blood sugars mostly in the evenings around 8 PM She does have a few high readings after evening meals when she is not consistent with diet She has lost little weight since last year but still not doing too well and not motivated to do any exercise on her own  Glucometer: Bayer contour.  Blood Glucose readings from download   POST-MEAL PC Breakfast PC Lunch PC Dinner  Glucose range: 119-135 119 99-247  Mean/median:   148          Last consultation with dietitian: Unknown              Wt Readings from Last 3 Encounters:  05/30/15 181 lb 12.8 oz (82.464 kg)  11/15/14 182 lb (82.555 kg)  05/17/14 185 lb 3.2 oz (84.006 kg)   Lab Results  Component Value Date   HGBA1C 6.5 05/28/2015   HGBA1C 6.6* 11/08/2014   HGBA1C 6.6* 05/15/2014   Lab Results  Component Value Date   MICROALBUR 0.9 11/16/2013   LDLCALC 84 05/15/2014   CREATININE 0.86 05/28/2015    HYPOTHYROIDISM:  The patient has had mild Hypothyroidism that was preceded by transient hyperthyroidism likely to be from autoimmune disease/thyroiditis Her TSH  been maintained in the normal range on low-dose levothyroxine 50 mcg since about 6/12.  She does not have any unusual fatigue She is compliant with her Synthroid every morning  Also on initial presentation she had a multinodular goiter which has been  smaller and stable subsequently  Labs:  TSH 1.97 in 8/16 from PCP  Lab Results  Component Value Date   TSH 2.04 11/08/2014     Appointment on 05/28/2015  Component Date Value Ref Range Status  . Hgb A1c MFr Bld 05/28/2015 6.5  4.6 - 6.5 % Final   Glycemic Control Guidelines for People with Diabetes:Non Diabetic:  <6%Goal of Therapy: <7%Additional Action Suggested:  >8%   . Sodium 05/28/2015 139  135 - 145 mEq/L Final  . Potassium 05/28/2015 3.8  3.5 - 5.1 mEq/L Final  . Chloride 05/28/2015 100  96 - 112 mEq/L Final  . CO2 05/28/2015 34* 19 - 32 mEq/L Final  . Glucose, Bld 05/28/2015 152* 70 - 99 mg/dL Final  . BUN 05/28/2015 18  6 - 23 mg/dL Final  . Creatinine, Ser 05/28/2015 0.86  0.40 - 1.20 mg/dL Final  . Total Bilirubin 05/28/2015 0.5  0.2 - 1.2 mg/dL Final  . Alkaline Phosphatase 05/28/2015 41  39 - 117 U/L Final  . AST 05/28/2015 19  0 - 37 U/L Final  . ALT 05/28/2015 20  0 - 35 U/L Final  . Total Protein 05/28/2015 6.6  6.0 - 8.3 g/dL Final  . Albumin 05/28/2015 4.1  3.5 - 5.2 g/dL Final  . Calcium 05/28/2015 9.5  8.4 - 10.5 mg/dL  Final  . GFR 05/28/2015 69.37  >60.00 mL/min Final  . Color, Urine 05/28/2015 YELLOW  Yellow;Lt. Yellow Final  . APPearance 05/28/2015 CLEAR  Clear Final  . Specific Gravity, Urine 05/28/2015 1.010  1.000-1.030 Final  . pH 05/28/2015 6.0  5.0 - 8.0 Final  . Total Protein, Urine 05/28/2015 NEGATIVE  Negative Final  . Urine Glucose 05/28/2015 NEGATIVE  Negative Final  . Ketones, ur 05/28/2015 NEGATIVE  Negative Final  . Bilirubin Urine 05/28/2015 NEGATIVE  Negative Final  . Hgb urine dipstick 05/28/2015 NEGATIVE  Negative Final  . Urobilinogen, UA 05/28/2015 0.2  0.0 - 1.0 Final  . Leukocytes, UA 05/28/2015 NEGATIVE  Negative Final  . Nitrite 05/28/2015 NEGATIVE  Negative Final  . WBC, UA 05/28/2015 0-2/hpf  0-2/hpf Final  . RBC / HPF 05/28/2015 none seen  0-2/hpf Final  . Squamous Epithelial / LPF 05/28/2015 Rare(0-4/hpf)  Rare(0-4/hpf)  Final      Medication List       This list is accurate as of: 05/30/15 11:59 PM.  Always use your most recent med list.               azelastine 0.1 % nasal spray  Commonly known as:  ASTELIN  Place 1 spray into the nose 2 (two) times daily. Use in each nostril as directed     benazepril-hydrochlorthiazide 20-25 MG per tablet  Commonly known as:  LOTENSIN HCT  Take 1 tablet by mouth daily.     calcium carbonate 1500 (600 CA) MG Tabs tablet  Commonly known as:  OSCAL  Take by mouth 2 (two) times daily with a meal.     clonazePAM 0.5 MG tablet  Commonly known as:  KLONOPIN  Take 0.5 mg by mouth as needed for anxiety.     gabapentin 100 MG capsule  Commonly known as:  NEURONTIN  TAKE 1 CAPSULE (100 MG TOTAL) BY MOUTH 3 (THREE) TIMES DAILY.     glucose blood test strip  Commonly known as:  FREESTYLE TEST STRIPS  USE AS INSTRUCTED TO CHECK BLOOD SUGAR ONCE DAILY DX CODE E11.9     levothyroxine 50 MCG tablet  Commonly known as:  SYNTHROID, LEVOTHROID  Take 1 tablet (50 mcg total) by mouth daily before breakfast.     levothyroxine 50 MCG tablet  Commonly known as:  SYNTHROID, LEVOTHROID  TAKE 1 TABLET (50 MCG TOTAL) BY MOUTH DAILY BEFORE BREAKFAST.     metFORMIN 500 MG tablet  Commonly known as:  GLUCOPHAGE  Take 500 mg by mouth 2 (two) times daily with a meal. 1 in am and 2 at night     pirbuterol 200 MCG/INH inhaler  Commonly known as:  MAXAIR  Inhale 2 puffs into the lungs 4 (four) times daily.     potassium chloride SA 20 MEQ tablet  Commonly known as:  K-DUR,KLOR-CON  Take 1 tablet (20 mEq total) by mouth daily.     pravastatin 40 MG tablet  Commonly known as:  PRAVACHOL  Take 40 mg by mouth daily.     PROAIR HFA 108 (90 BASE) MCG/ACT inhaler  Generic drug:  albuterol  INHALE 1 TO 2 PUFFS BY MOUTH EVERY 6 HOURS AS NEEDED     Vitamin D-3 1000 UNITS Caps  Take 1,000 Units by mouth.        Allergies:  Allergies  Allergen Reactions  . Cozaar  [Losartan Potassium]     Had knee swelling  . Crestor [Rosuvastatin]     Leg swelling  Past Medical History  Diagnosis Date  . Uterine cancer     Past Surgical History  Procedure Laterality Date  . Cholecystectomy    . Abdominal hysterectomy    . Spine surgery      Family History  Problem Relation Age of Onset  . Heart disease Mother   . Heart disease Father     Social History:  reports that she has never smoked. She has never used smokeless tobacco. Her alcohol and drug histories are not on file.  Review of Systems:  HYPERTENSION:  well-controlled with Lotensin HCT.  Initial blood pressure today was high  BP 130/80 at work No history of microalbuminuria Is taking potassium irregularly but still her level is ok on this visit, previously low  HYPERLIPIDEMIA: The lipid abnormality consists of elevated LDL. LDL is at target with pravastatin, followed by PCP Recent triglycerides better at 112 and LDL 87, done in 8/16 HDL also in good range   Lab Results  Component Value Date   CHOL 175 05/15/2014   HDL 56.50 05/15/2014   LDLCALC 84 05/15/2014   TRIG 174.0* 05/15/2014   CHOLHDL 3 05/15/2014    Gabapentin helps control her mild neuropathicsymptoms but she does not need this daily  Last diabetic foot exam done in 3/16 and with PCP in 8/16   Examination:   BP 128/84 mmHg  Pulse 100  Temp(Src) 99.1 F (37.3 C)  Resp 16  Ht 5\' 6"  (1.676 m)  Wt 181 lb 12.8 oz (82.464 kg)  BMI 29.36 kg/m2  SpO2 96%  Body mass index is 29.36 kg/(m^2).    No edema      ASSESSMENT/ PLAN:   Diabetes type 2   The patient's diabetes control appears to be fairly good with upper normal A1c. She is taking metformin only and encouraged her to take 1500 mg daily  Discussed timing of glucose monitoring, blood sugar targets Also discussed starting a regular walking program   HYPOTHYROIDISM: This is mild and stable with normal TSH on 50 mcg.  She is doing well subjectively also  and has only a barely palpable multinodular goiter on her exams  Hyperlipidemia: Well controlled  HYPERTENSION: Well controlled   She will follow up as needed as her diabetes is mild and stable and can be followed by PCP.   KUMAR,AJAY 06/01/2015, 3:55 PM

## 2015-07-09 ENCOUNTER — Other Ambulatory Visit: Payer: Self-pay | Admitting: Endocrinology

## 2015-11-21 DIAGNOSIS — E119 Type 2 diabetes mellitus without complications: Secondary | ICD-10-CM | POA: Diagnosis not present

## 2015-11-21 DIAGNOSIS — E039 Hypothyroidism, unspecified: Secondary | ICD-10-CM | POA: Diagnosis not present

## 2015-11-21 DIAGNOSIS — F419 Anxiety disorder, unspecified: Secondary | ICD-10-CM | POA: Diagnosis not present

## 2015-11-21 DIAGNOSIS — J302 Other seasonal allergic rhinitis: Secondary | ICD-10-CM | POA: Diagnosis not present

## 2015-11-21 DIAGNOSIS — Z7984 Long term (current) use of oral hypoglycemic drugs: Secondary | ICD-10-CM | POA: Diagnosis not present

## 2015-11-21 DIAGNOSIS — E7800 Pure hypercholesterolemia, unspecified: Secondary | ICD-10-CM | POA: Diagnosis not present

## 2015-11-21 DIAGNOSIS — R002 Palpitations: Secondary | ICD-10-CM | POA: Diagnosis not present

## 2015-11-21 DIAGNOSIS — I10 Essential (primary) hypertension: Secondary | ICD-10-CM | POA: Diagnosis not present

## 2015-11-21 DIAGNOSIS — E876 Hypokalemia: Secondary | ICD-10-CM | POA: Diagnosis not present

## 2015-11-21 DIAGNOSIS — E559 Vitamin D deficiency, unspecified: Secondary | ICD-10-CM | POA: Diagnosis not present

## 2015-12-16 DIAGNOSIS — J0100 Acute maxillary sinusitis, unspecified: Secondary | ICD-10-CM | POA: Diagnosis not present

## 2016-01-10 ENCOUNTER — Other Ambulatory Visit: Payer: Self-pay | Admitting: Endocrinology

## 2016-04-14 ENCOUNTER — Other Ambulatory Visit: Payer: Self-pay | Admitting: Endocrinology

## 2016-05-16 ENCOUNTER — Other Ambulatory Visit: Payer: Self-pay | Admitting: Endocrinology

## 2016-06-16 ENCOUNTER — Other Ambulatory Visit: Payer: Self-pay | Admitting: Endocrinology

## 2016-06-25 DIAGNOSIS — F419 Anxiety disorder, unspecified: Secondary | ICD-10-CM | POA: Diagnosis not present

## 2016-06-25 DIAGNOSIS — E876 Hypokalemia: Secondary | ICD-10-CM | POA: Diagnosis not present

## 2016-06-25 DIAGNOSIS — Z23 Encounter for immunization: Secondary | ICD-10-CM | POA: Diagnosis not present

## 2016-06-25 DIAGNOSIS — I10 Essential (primary) hypertension: Secondary | ICD-10-CM | POA: Diagnosis not present

## 2016-06-25 DIAGNOSIS — E039 Hypothyroidism, unspecified: Secondary | ICD-10-CM | POA: Diagnosis not present

## 2016-06-25 DIAGNOSIS — Z7984 Long term (current) use of oral hypoglycemic drugs: Secondary | ICD-10-CM | POA: Diagnosis not present

## 2016-06-25 DIAGNOSIS — E119 Type 2 diabetes mellitus without complications: Secondary | ICD-10-CM | POA: Diagnosis not present

## 2016-06-25 DIAGNOSIS — E7800 Pure hypercholesterolemia, unspecified: Secondary | ICD-10-CM | POA: Diagnosis not present

## 2016-06-25 DIAGNOSIS — E559 Vitamin D deficiency, unspecified: Secondary | ICD-10-CM | POA: Diagnosis not present

## 2017-04-08 DIAGNOSIS — F419 Anxiety disorder, unspecified: Secondary | ICD-10-CM | POA: Diagnosis not present

## 2017-04-08 DIAGNOSIS — E559 Vitamin D deficiency, unspecified: Secondary | ICD-10-CM | POA: Diagnosis not present

## 2017-04-08 DIAGNOSIS — E7800 Pure hypercholesterolemia, unspecified: Secondary | ICD-10-CM | POA: Diagnosis not present

## 2017-04-08 DIAGNOSIS — E876 Hypokalemia: Secondary | ICD-10-CM | POA: Diagnosis not present

## 2017-04-08 DIAGNOSIS — E039 Hypothyroidism, unspecified: Secondary | ICD-10-CM | POA: Diagnosis not present

## 2017-04-08 DIAGNOSIS — G629 Polyneuropathy, unspecified: Secondary | ICD-10-CM | POA: Diagnosis not present

## 2017-04-08 DIAGNOSIS — I10 Essential (primary) hypertension: Secondary | ICD-10-CM | POA: Diagnosis not present

## 2017-04-08 DIAGNOSIS — Z7984 Long term (current) use of oral hypoglycemic drugs: Secondary | ICD-10-CM | POA: Diagnosis not present

## 2017-04-08 DIAGNOSIS — E119 Type 2 diabetes mellitus without complications: Secondary | ICD-10-CM | POA: Diagnosis not present

## 2017-11-04 DIAGNOSIS — I10 Essential (primary) hypertension: Secondary | ICD-10-CM | POA: Diagnosis not present

## 2017-11-04 DIAGNOSIS — E039 Hypothyroidism, unspecified: Secondary | ICD-10-CM | POA: Diagnosis not present

## 2017-11-04 DIAGNOSIS — F419 Anxiety disorder, unspecified: Secondary | ICD-10-CM | POA: Diagnosis not present

## 2017-11-04 DIAGNOSIS — E7800 Pure hypercholesterolemia, unspecified: Secondary | ICD-10-CM | POA: Diagnosis not present

## 2017-11-04 DIAGNOSIS — Z23 Encounter for immunization: Secondary | ICD-10-CM | POA: Diagnosis not present

## 2017-11-04 DIAGNOSIS — E559 Vitamin D deficiency, unspecified: Secondary | ICD-10-CM | POA: Diagnosis not present

## 2017-11-04 DIAGNOSIS — Z7984 Long term (current) use of oral hypoglycemic drugs: Secondary | ICD-10-CM | POA: Diagnosis not present

## 2017-11-04 DIAGNOSIS — R079 Chest pain, unspecified: Secondary | ICD-10-CM | POA: Diagnosis not present

## 2017-11-04 DIAGNOSIS — E119 Type 2 diabetes mellitus without complications: Secondary | ICD-10-CM | POA: Diagnosis not present

## 2018-05-13 DIAGNOSIS — R319 Hematuria, unspecified: Secondary | ICD-10-CM | POA: Diagnosis not present

## 2018-05-13 DIAGNOSIS — N390 Urinary tract infection, site not specified: Secondary | ICD-10-CM | POA: Diagnosis not present

## 2018-06-30 DIAGNOSIS — E876 Hypokalemia: Secondary | ICD-10-CM | POA: Diagnosis not present

## 2018-06-30 DIAGNOSIS — I10 Essential (primary) hypertension: Secondary | ICD-10-CM | POA: Diagnosis not present

## 2018-06-30 DIAGNOSIS — F419 Anxiety disorder, unspecified: Secondary | ICD-10-CM | POA: Diagnosis not present

## 2018-06-30 DIAGNOSIS — J45909 Unspecified asthma, uncomplicated: Secondary | ICD-10-CM | POA: Diagnosis not present

## 2018-06-30 DIAGNOSIS — Z7984 Long term (current) use of oral hypoglycemic drugs: Secondary | ICD-10-CM | POA: Diagnosis not present

## 2018-06-30 DIAGNOSIS — E039 Hypothyroidism, unspecified: Secondary | ICD-10-CM | POA: Diagnosis not present

## 2018-06-30 DIAGNOSIS — E119 Type 2 diabetes mellitus without complications: Secondary | ICD-10-CM | POA: Diagnosis not present

## 2018-06-30 DIAGNOSIS — E7800 Pure hypercholesterolemia, unspecified: Secondary | ICD-10-CM | POA: Diagnosis not present

## 2018-06-30 DIAGNOSIS — E559 Vitamin D deficiency, unspecified: Secondary | ICD-10-CM | POA: Diagnosis not present

## 2018-09-13 DIAGNOSIS — H269 Unspecified cataract: Secondary | ICD-10-CM

## 2018-09-13 HISTORY — DX: Unspecified cataract: H26.9

## 2018-09-29 DIAGNOSIS — H524 Presbyopia: Secondary | ICD-10-CM | POA: Diagnosis not present

## 2018-09-29 DIAGNOSIS — H5203 Hypermetropia, bilateral: Secondary | ICD-10-CM | POA: Diagnosis not present

## 2018-09-29 DIAGNOSIS — H52223 Regular astigmatism, bilateral: Secondary | ICD-10-CM | POA: Diagnosis not present

## 2018-09-29 DIAGNOSIS — E119 Type 2 diabetes mellitus without complications: Secondary | ICD-10-CM | POA: Diagnosis not present

## 2018-09-29 DIAGNOSIS — H25813 Combined forms of age-related cataract, bilateral: Secondary | ICD-10-CM | POA: Diagnosis not present

## 2018-09-29 DIAGNOSIS — Z7984 Long term (current) use of oral hypoglycemic drugs: Secondary | ICD-10-CM | POA: Diagnosis not present

## 2018-12-29 DIAGNOSIS — E119 Type 2 diabetes mellitus without complications: Secondary | ICD-10-CM | POA: Diagnosis not present

## 2018-12-29 DIAGNOSIS — E7800 Pure hypercholesterolemia, unspecified: Secondary | ICD-10-CM | POA: Diagnosis not present

## 2018-12-29 DIAGNOSIS — I10 Essential (primary) hypertension: Secondary | ICD-10-CM | POA: Diagnosis not present

## 2018-12-29 DIAGNOSIS — E876 Hypokalemia: Secondary | ICD-10-CM | POA: Diagnosis not present

## 2018-12-29 DIAGNOSIS — J309 Allergic rhinitis, unspecified: Secondary | ICD-10-CM | POA: Diagnosis not present

## 2018-12-29 DIAGNOSIS — E559 Vitamin D deficiency, unspecified: Secondary | ICD-10-CM | POA: Diagnosis not present

## 2018-12-29 DIAGNOSIS — E039 Hypothyroidism, unspecified: Secondary | ICD-10-CM | POA: Diagnosis not present

## 2018-12-29 DIAGNOSIS — F419 Anxiety disorder, unspecified: Secondary | ICD-10-CM | POA: Diagnosis not present

## 2019-07-20 DIAGNOSIS — J302 Other seasonal allergic rhinitis: Secondary | ICD-10-CM | POA: Diagnosis not present

## 2019-07-20 DIAGNOSIS — F419 Anxiety disorder, unspecified: Secondary | ICD-10-CM | POA: Diagnosis not present

## 2019-07-20 DIAGNOSIS — E1169 Type 2 diabetes mellitus with other specified complication: Secondary | ICD-10-CM | POA: Diagnosis not present

## 2019-07-20 DIAGNOSIS — E559 Vitamin D deficiency, unspecified: Secondary | ICD-10-CM | POA: Diagnosis not present

## 2019-07-20 DIAGNOSIS — G629 Polyneuropathy, unspecified: Secondary | ICD-10-CM | POA: Diagnosis not present

## 2019-07-20 DIAGNOSIS — E876 Hypokalemia: Secondary | ICD-10-CM | POA: Diagnosis not present

## 2019-07-20 DIAGNOSIS — Z7984 Long term (current) use of oral hypoglycemic drugs: Secondary | ICD-10-CM | POA: Diagnosis not present

## 2019-07-20 DIAGNOSIS — I10 Essential (primary) hypertension: Secondary | ICD-10-CM | POA: Diagnosis not present

## 2019-07-20 DIAGNOSIS — E7800 Pure hypercholesterolemia, unspecified: Secondary | ICD-10-CM | POA: Diagnosis not present

## 2019-07-20 DIAGNOSIS — E039 Hypothyroidism, unspecified: Secondary | ICD-10-CM | POA: Diagnosis not present

## 2019-09-28 DIAGNOSIS — E876 Hypokalemia: Secondary | ICD-10-CM | POA: Diagnosis not present

## 2019-09-28 DIAGNOSIS — J302 Other seasonal allergic rhinitis: Secondary | ICD-10-CM | POA: Diagnosis not present

## 2019-09-28 DIAGNOSIS — F419 Anxiety disorder, unspecified: Secondary | ICD-10-CM | POA: Diagnosis not present

## 2019-09-28 DIAGNOSIS — E039 Hypothyroidism, unspecified: Secondary | ICD-10-CM | POA: Diagnosis not present

## 2019-09-28 DIAGNOSIS — E559 Vitamin D deficiency, unspecified: Secondary | ICD-10-CM | POA: Diagnosis not present

## 2019-09-28 DIAGNOSIS — I10 Essential (primary) hypertension: Secondary | ICD-10-CM | POA: Diagnosis not present

## 2019-09-28 DIAGNOSIS — G629 Polyneuropathy, unspecified: Secondary | ICD-10-CM | POA: Diagnosis not present

## 2019-09-28 DIAGNOSIS — E7800 Pure hypercholesterolemia, unspecified: Secondary | ICD-10-CM | POA: Diagnosis not present

## 2019-09-28 DIAGNOSIS — E1169 Type 2 diabetes mellitus with other specified complication: Secondary | ICD-10-CM | POA: Diagnosis not present

## 2020-01-04 DIAGNOSIS — E1169 Type 2 diabetes mellitus with other specified complication: Secondary | ICD-10-CM | POA: Diagnosis not present

## 2020-04-09 DIAGNOSIS — H5203 Hypermetropia, bilateral: Secondary | ICD-10-CM | POA: Diagnosis not present

## 2020-04-09 DIAGNOSIS — H2513 Age-related nuclear cataract, bilateral: Secondary | ICD-10-CM | POA: Diagnosis not present

## 2020-04-09 DIAGNOSIS — E119 Type 2 diabetes mellitus without complications: Secondary | ICD-10-CM | POA: Diagnosis not present

## 2020-04-09 DIAGNOSIS — H35033 Hypertensive retinopathy, bilateral: Secondary | ICD-10-CM | POA: Diagnosis not present

## 2020-04-09 DIAGNOSIS — H524 Presbyopia: Secondary | ICD-10-CM | POA: Diagnosis not present

## 2020-04-09 DIAGNOSIS — I10 Essential (primary) hypertension: Secondary | ICD-10-CM | POA: Diagnosis not present

## 2020-04-09 DIAGNOSIS — H52223 Regular astigmatism, bilateral: Secondary | ICD-10-CM | POA: Diagnosis not present

## 2020-04-23 DIAGNOSIS — E1169 Type 2 diabetes mellitus with other specified complication: Secondary | ICD-10-CM | POA: Diagnosis not present

## 2020-04-23 DIAGNOSIS — E039 Hypothyroidism, unspecified: Secondary | ICD-10-CM | POA: Diagnosis not present

## 2020-04-23 DIAGNOSIS — E7800 Pure hypercholesterolemia, unspecified: Secondary | ICD-10-CM | POA: Diagnosis not present

## 2020-04-23 DIAGNOSIS — I10 Essential (primary) hypertension: Secondary | ICD-10-CM | POA: Diagnosis not present

## 2021-01-23 DIAGNOSIS — G629 Polyneuropathy, unspecified: Secondary | ICD-10-CM | POA: Diagnosis not present

## 2021-01-23 DIAGNOSIS — Z7984 Long term (current) use of oral hypoglycemic drugs: Secondary | ICD-10-CM | POA: Diagnosis not present

## 2021-01-23 DIAGNOSIS — E559 Vitamin D deficiency, unspecified: Secondary | ICD-10-CM | POA: Diagnosis not present

## 2021-01-23 DIAGNOSIS — I10 Essential (primary) hypertension: Secondary | ICD-10-CM | POA: Diagnosis not present

## 2021-01-23 DIAGNOSIS — J309 Allergic rhinitis, unspecified: Secondary | ICD-10-CM | POA: Diagnosis not present

## 2021-01-23 DIAGNOSIS — F419 Anxiety disorder, unspecified: Secondary | ICD-10-CM | POA: Diagnosis not present

## 2021-01-23 DIAGNOSIS — E1169 Type 2 diabetes mellitus with other specified complication: Secondary | ICD-10-CM | POA: Diagnosis not present

## 2021-01-23 DIAGNOSIS — E7800 Pure hypercholesterolemia, unspecified: Secondary | ICD-10-CM | POA: Diagnosis not present

## 2021-01-23 DIAGNOSIS — E039 Hypothyroidism, unspecified: Secondary | ICD-10-CM | POA: Diagnosis not present

## 2021-01-23 DIAGNOSIS — J302 Other seasonal allergic rhinitis: Secondary | ICD-10-CM | POA: Diagnosis not present

## 2021-10-02 DIAGNOSIS — Z23 Encounter for immunization: Secondary | ICD-10-CM | POA: Diagnosis not present

## 2021-10-02 DIAGNOSIS — E559 Vitamin D deficiency, unspecified: Secondary | ICD-10-CM | POA: Diagnosis not present

## 2021-10-02 DIAGNOSIS — R059 Cough, unspecified: Secondary | ICD-10-CM | POA: Diagnosis not present

## 2021-10-02 DIAGNOSIS — E876 Hypokalemia: Secondary | ICD-10-CM | POA: Diagnosis not present

## 2021-10-02 DIAGNOSIS — I10 Essential (primary) hypertension: Secondary | ICD-10-CM | POA: Diagnosis not present

## 2021-10-02 DIAGNOSIS — E7800 Pure hypercholesterolemia, unspecified: Secondary | ICD-10-CM | POA: Diagnosis not present

## 2021-10-02 DIAGNOSIS — F419 Anxiety disorder, unspecified: Secondary | ICD-10-CM | POA: Diagnosis not present

## 2021-10-02 DIAGNOSIS — G629 Polyneuropathy, unspecified: Secondary | ICD-10-CM | POA: Diagnosis not present

## 2021-10-02 DIAGNOSIS — E039 Hypothyroidism, unspecified: Secondary | ICD-10-CM | POA: Diagnosis not present

## 2021-10-02 DIAGNOSIS — E1169 Type 2 diabetes mellitus with other specified complication: Secondary | ICD-10-CM | POA: Diagnosis not present

## 2021-10-09 ENCOUNTER — Other Ambulatory Visit: Payer: Self-pay | Admitting: Family Medicine

## 2021-10-09 ENCOUNTER — Ambulatory Visit
Admission: RE | Admit: 2021-10-09 | Discharge: 2021-10-09 | Disposition: A | Discharge status: Home / Self Care | Payer: PPO | Source: Ambulatory Visit | Attending: Family Medicine | Admitting: Family Medicine

## 2021-10-09 DIAGNOSIS — R0989 Other specified symptoms and signs involving the circulatory and respiratory systems: Secondary | ICD-10-CM | POA: Diagnosis not present

## 2021-10-09 DIAGNOSIS — R0689 Other abnormalities of breathing: Secondary | ICD-10-CM

## 2021-11-13 DIAGNOSIS — R350 Frequency of micturition: Secondary | ICD-10-CM | POA: Diagnosis not present

## 2022-03-03 DIAGNOSIS — Z1389 Encounter for screening for other disorder: Secondary | ICD-10-CM | POA: Diagnosis not present

## 2022-03-03 DIAGNOSIS — Z0000 Encounter for general adult medical examination without abnormal findings: Secondary | ICD-10-CM | POA: Diagnosis not present

## 2022-04-28 DIAGNOSIS — J329 Chronic sinusitis, unspecified: Secondary | ICD-10-CM | POA: Diagnosis not present

## 2022-04-28 DIAGNOSIS — H66001 Acute suppurative otitis media without spontaneous rupture of ear drum, right ear: Secondary | ICD-10-CM | POA: Diagnosis not present

## 2022-04-28 DIAGNOSIS — R059 Cough, unspecified: Secondary | ICD-10-CM | POA: Diagnosis not present

## 2022-04-28 DIAGNOSIS — J40 Bronchitis, not specified as acute or chronic: Secondary | ICD-10-CM | POA: Diagnosis not present

## 2022-06-18 DIAGNOSIS — J302 Other seasonal allergic rhinitis: Secondary | ICD-10-CM | POA: Diagnosis not present

## 2022-06-18 DIAGNOSIS — E1169 Type 2 diabetes mellitus with other specified complication: Secondary | ICD-10-CM | POA: Diagnosis not present

## 2022-06-18 DIAGNOSIS — E039 Hypothyroidism, unspecified: Secondary | ICD-10-CM | POA: Diagnosis not present

## 2022-06-18 DIAGNOSIS — E876 Hypokalemia: Secondary | ICD-10-CM | POA: Diagnosis not present

## 2022-06-18 DIAGNOSIS — F419 Anxiety disorder, unspecified: Secondary | ICD-10-CM | POA: Diagnosis not present

## 2022-06-18 DIAGNOSIS — I10 Essential (primary) hypertension: Secondary | ICD-10-CM | POA: Diagnosis not present

## 2022-06-18 DIAGNOSIS — Z6828 Body mass index (BMI) 28.0-28.9, adult: Secondary | ICD-10-CM | POA: Diagnosis not present

## 2022-06-18 DIAGNOSIS — Z79899 Other long term (current) drug therapy: Secondary | ICD-10-CM | POA: Diagnosis not present

## 2022-06-18 DIAGNOSIS — J329 Chronic sinusitis, unspecified: Secondary | ICD-10-CM | POA: Diagnosis not present

## 2022-06-18 DIAGNOSIS — G629 Polyneuropathy, unspecified: Secondary | ICD-10-CM | POA: Diagnosis not present

## 2022-06-18 DIAGNOSIS — R059 Cough, unspecified: Secondary | ICD-10-CM | POA: Diagnosis not present

## 2022-07-28 DIAGNOSIS — E119 Type 2 diabetes mellitus without complications: Secondary | ICD-10-CM | POA: Diagnosis not present

## 2022-07-28 DIAGNOSIS — H5203 Hypermetropia, bilateral: Secondary | ICD-10-CM | POA: Diagnosis not present

## 2022-07-28 DIAGNOSIS — H52223 Regular astigmatism, bilateral: Secondary | ICD-10-CM | POA: Diagnosis not present

## 2022-07-28 DIAGNOSIS — H2513 Age-related nuclear cataract, bilateral: Secondary | ICD-10-CM | POA: Diagnosis not present

## 2022-07-28 DIAGNOSIS — H35033 Hypertensive retinopathy, bilateral: Secondary | ICD-10-CM | POA: Diagnosis not present

## 2022-07-28 DIAGNOSIS — H524 Presbyopia: Secondary | ICD-10-CM | POA: Diagnosis not present

## 2022-07-28 DIAGNOSIS — I10 Essential (primary) hypertension: Secondary | ICD-10-CM | POA: Diagnosis not present

## 2022-09-17 DIAGNOSIS — E2839 Other primary ovarian failure: Secondary | ICD-10-CM | POA: Diagnosis not present

## 2022-09-17 DIAGNOSIS — Z6828 Body mass index (BMI) 28.0-28.9, adult: Secondary | ICD-10-CM | POA: Diagnosis not present

## 2022-09-17 DIAGNOSIS — Z79899 Other long term (current) drug therapy: Secondary | ICD-10-CM | POA: Diagnosis not present

## 2022-09-17 DIAGNOSIS — Z7984 Long term (current) use of oral hypoglycemic drugs: Secondary | ICD-10-CM | POA: Diagnosis not present

## 2022-09-17 DIAGNOSIS — B372 Candidiasis of skin and nail: Secondary | ICD-10-CM | POA: Diagnosis not present

## 2022-09-17 DIAGNOSIS — E039 Hypothyroidism, unspecified: Secondary | ICD-10-CM | POA: Diagnosis not present

## 2022-09-17 DIAGNOSIS — E1169 Type 2 diabetes mellitus with other specified complication: Secondary | ICD-10-CM | POA: Diagnosis not present

## 2022-09-17 DIAGNOSIS — Z23 Encounter for immunization: Secondary | ICD-10-CM | POA: Diagnosis not present

## 2022-09-17 DIAGNOSIS — E559 Vitamin D deficiency, unspecified: Secondary | ICD-10-CM | POA: Diagnosis not present

## 2022-10-08 DIAGNOSIS — M85832 Other specified disorders of bone density and structure, left forearm: Secondary | ICD-10-CM | POA: Diagnosis not present

## 2022-10-08 DIAGNOSIS — Z1231 Encounter for screening mammogram for malignant neoplasm of breast: Secondary | ICD-10-CM | POA: Diagnosis not present

## 2022-10-11 IMAGING — CR DG CHEST 2V
2 series · 2 of 2 positions shown · non-contrast
Comparison: Chest x-ray 01/05/2008.

CLINICAL DATA: Abnormal breath sounds.

EXAM:
CHEST - 2 VIEW

[w chest pa]
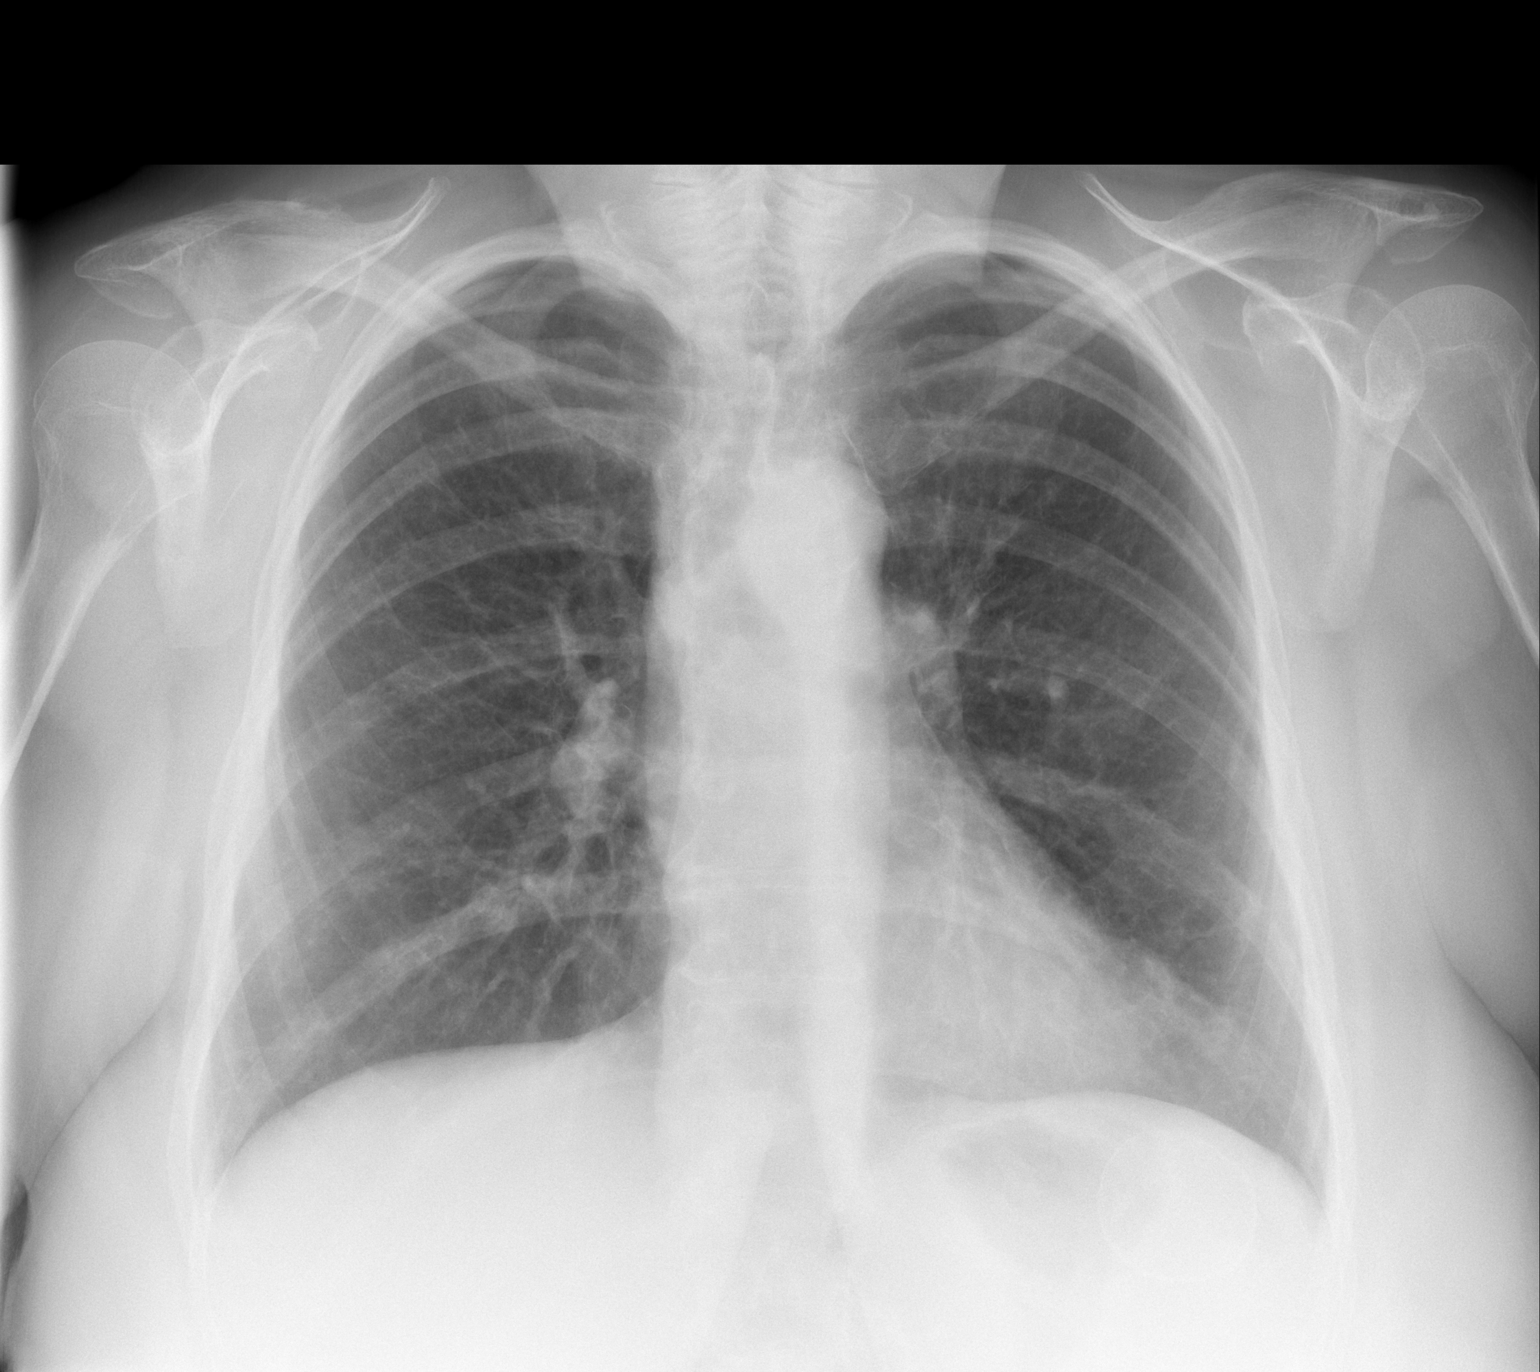

[w chest lat]
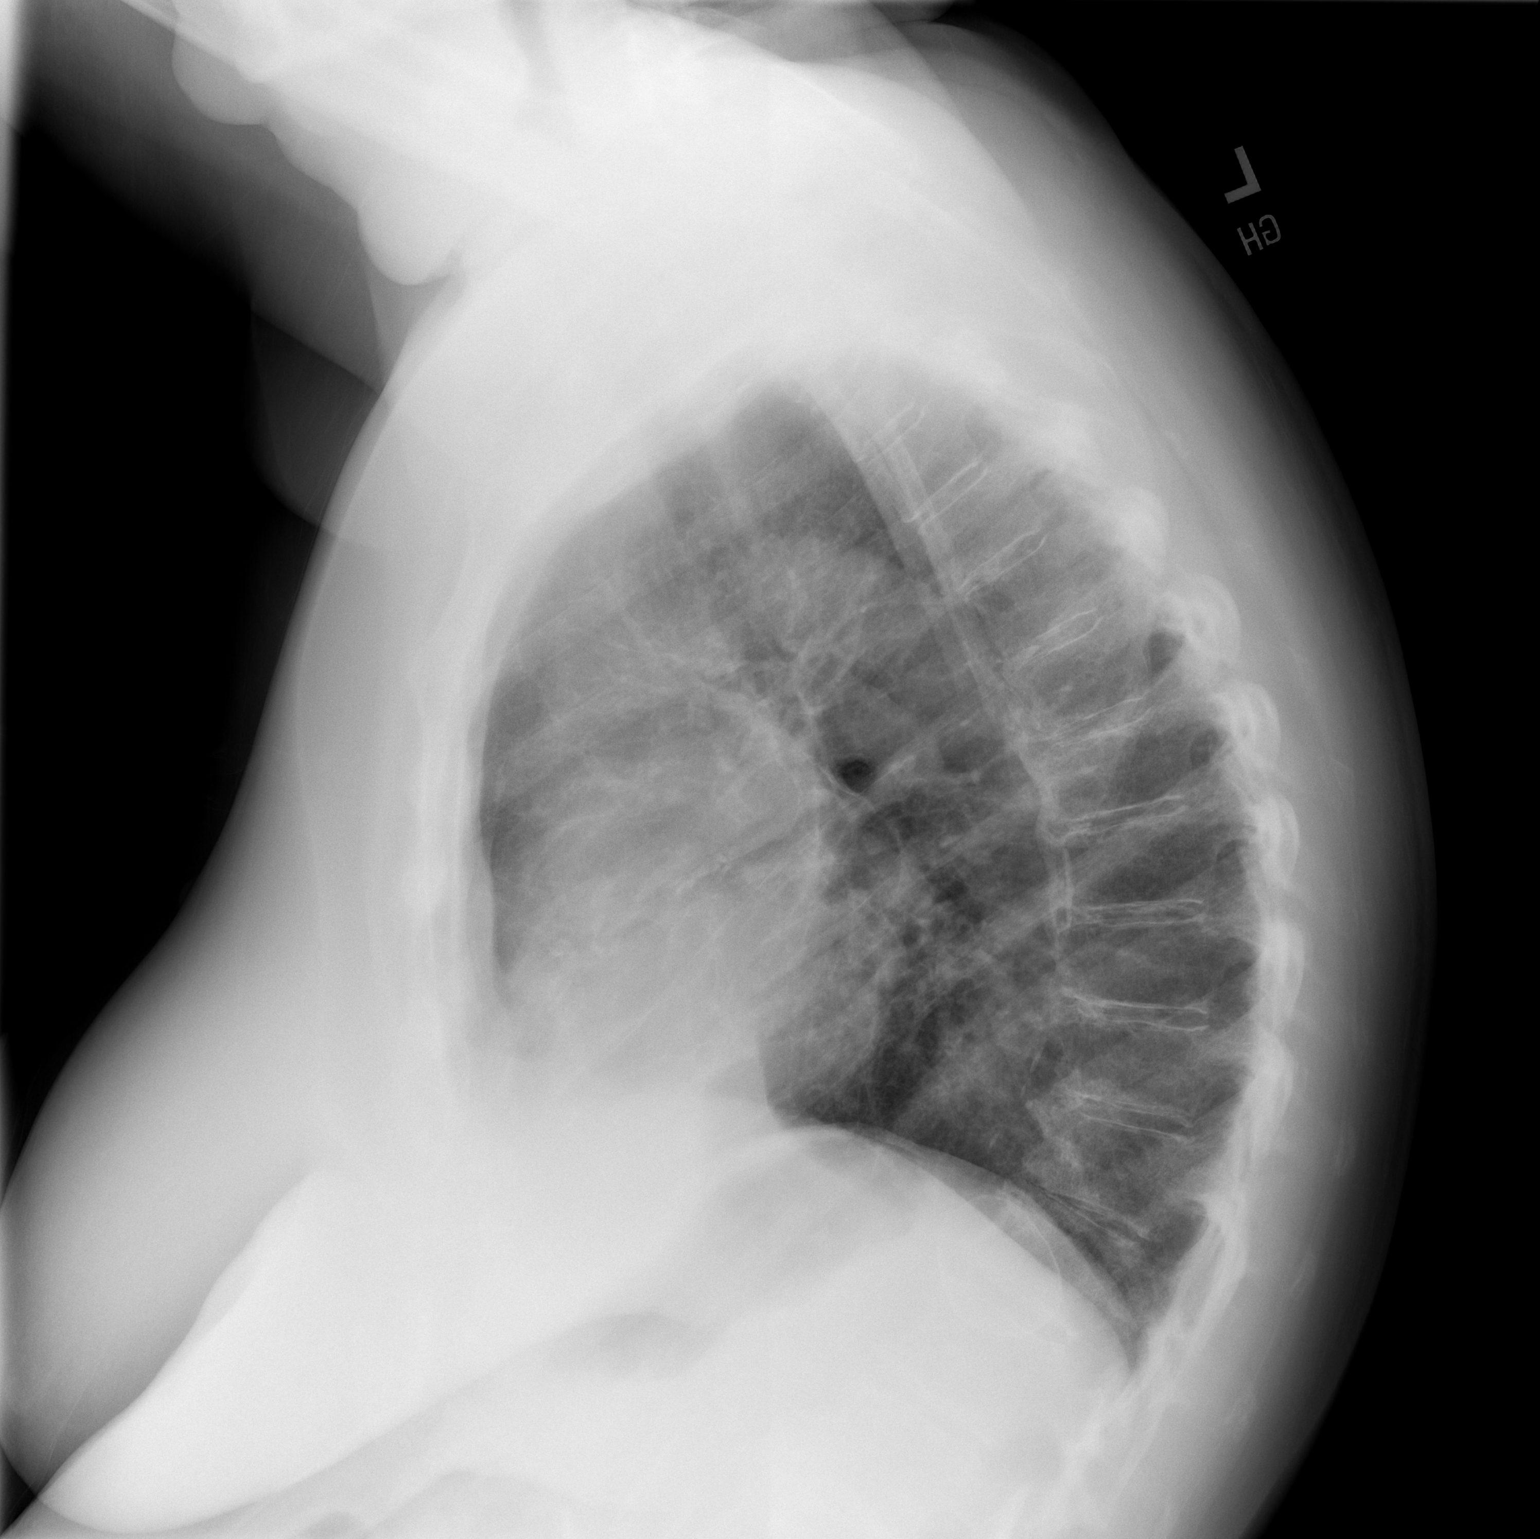

[2 of 2 positions shown; findings below may reference images not displayed]

FINDINGS: The heart size and mediastinal contours are within normal limits.
Both lungs are clear. The visualized skeletal structures are
unremarkable.
IMPRESSION: No active cardiopulmonary disease.

## 2023-03-21 ENCOUNTER — Inpatient Hospital Stay (HOSPITAL_COMMUNITY)
Admission: EM | Admit: 2023-03-21 | Discharge: 2023-03-24 | DRG: 322 | Disposition: A | Discharge status: Home / Self Care | Payer: PPO | Attending: Cardiovascular Disease | Admitting: Cardiovascular Disease

## 2023-03-21 ENCOUNTER — Other Ambulatory Visit: Payer: Self-pay

## 2023-03-21 ENCOUNTER — Observation Stay (HOSPITAL_COMMUNITY): Payer: PPO

## 2023-03-21 ENCOUNTER — Emergency Department (HOSPITAL_COMMUNITY): Payer: PPO

## 2023-03-21 ENCOUNTER — Encounter (HOSPITAL_COMMUNITY): Payer: Self-pay | Admitting: Cardiovascular Disease

## 2023-03-21 DIAGNOSIS — Z79899 Other long term (current) drug therapy: Secondary | ICD-10-CM | POA: Diagnosis not present

## 2023-03-21 DIAGNOSIS — R911 Solitary pulmonary nodule: Secondary | ICD-10-CM | POA: Diagnosis present

## 2023-03-21 DIAGNOSIS — R053 Chronic cough: Secondary | ICD-10-CM | POA: Diagnosis present

## 2023-03-21 DIAGNOSIS — Z8542 Personal history of malignant neoplasm of other parts of uterus: Secondary | ICD-10-CM | POA: Diagnosis not present

## 2023-03-21 DIAGNOSIS — E785 Hyperlipidemia, unspecified: Secondary | ICD-10-CM | POA: Diagnosis not present

## 2023-03-21 DIAGNOSIS — E119 Type 2 diabetes mellitus without complications: Secondary | ICD-10-CM | POA: Diagnosis not present

## 2023-03-21 DIAGNOSIS — Z7982 Long term (current) use of aspirin: Secondary | ICD-10-CM

## 2023-03-21 DIAGNOSIS — Z7984 Long term (current) use of oral hypoglycemic drugs: Secondary | ICD-10-CM | POA: Diagnosis not present

## 2023-03-21 DIAGNOSIS — Z79890 Hormone replacement therapy (postmenopausal): Secondary | ICD-10-CM

## 2023-03-21 DIAGNOSIS — R0789 Other chest pain: Principal | ICD-10-CM

## 2023-03-21 DIAGNOSIS — Z888 Allergy status to other drugs, medicaments and biological substances status: Secondary | ICD-10-CM | POA: Diagnosis not present

## 2023-03-21 DIAGNOSIS — E039 Hypothyroidism, unspecified: Secondary | ICD-10-CM | POA: Diagnosis not present

## 2023-03-21 DIAGNOSIS — F419 Anxiety disorder, unspecified: Secondary | ICD-10-CM | POA: Diagnosis present

## 2023-03-21 DIAGNOSIS — I214 Non-ST elevation (NSTEMI) myocardial infarction: Secondary | ICD-10-CM

## 2023-03-21 DIAGNOSIS — I2584 Coronary atherosclerosis due to calcified coronary lesion: Secondary | ICD-10-CM | POA: Diagnosis present

## 2023-03-21 DIAGNOSIS — I25118 Atherosclerotic heart disease of native coronary artery with other forms of angina pectoris: Secondary | ICD-10-CM | POA: Diagnosis not present

## 2023-03-21 DIAGNOSIS — Z9861 Coronary angioplasty status: Secondary | ICD-10-CM

## 2023-03-21 DIAGNOSIS — I10 Essential (primary) hypertension: Secondary | ICD-10-CM | POA: Diagnosis not present

## 2023-03-21 DIAGNOSIS — Z8249 Family history of ischemic heart disease and other diseases of the circulatory system: Secondary | ICD-10-CM

## 2023-03-21 DIAGNOSIS — I2510 Atherosclerotic heart disease of native coronary artery without angina pectoris: Secondary | ICD-10-CM | POA: Diagnosis not present

## 2023-03-21 DIAGNOSIS — R7989 Other specified abnormal findings of blood chemistry: Secondary | ICD-10-CM

## 2023-03-21 LAB — HEPATIC FUNCTION PANEL
ALT: 18 U/L (ref 0–44)
AST: 22 U/L (ref 15–41)
Albumin: 4 g/dL (ref 3.5–5.0)
Alkaline Phosphatase: 38 U/L (ref 38–126)
Bilirubin, Direct: <0.1 mg/dL (ref 0.0–0.2)
Total Bilirubin: 0.4 mg/dL (ref 0.3–1.2)
Total Protein: 6.6 g/dL (ref 6.5–8.1)

## 2023-03-21 LAB — BASIC METABOLIC PANEL
Anion gap: 8 (ref 5–15)
BUN: 12 mg/dL (ref 8–23)
CO2: 29 mmol/L (ref 22–32)
Calcium: 9.5 mg/dL (ref 8.9–10.3)
Chloride: 100 mmol/L (ref 98–111)
Creatinine, Ser: 0.85 mg/dL (ref 0.44–1.00)
GFR, Estimated: >60 mL/min (ref >60)
Glucose, Bld: 213 mg/dL — ABNORMAL HIGH (ref 70–99)
Potassium: 3.8 mmol/L (ref 3.5–5.1)
Sodium: 137 mmol/L (ref 135–145)

## 2023-03-21 LAB — BRAIN NATRIURETIC PEPTIDE: B Natriuretic Peptide: 20.2 pg/mL (ref 0.0–100.0)

## 2023-03-21 LAB — CBC
HCT: 45.7 % (ref 36.0–46.0)
Hemoglobin: 15.3 g/dL — ABNORMAL HIGH (ref 12.0–15.0)
MCH: 30.7 pg (ref 26.0–34.0)
MCHC: 33.5 g/dL (ref 30.0–36.0)
MCV: 91.6 fL (ref 80.0–100.0)
Platelets: 219 10*3/uL (ref 150–400)
RBC: 4.99 MIL/uL (ref 3.87–5.11)
RDW: 12.7 % (ref 11.5–15.5)
WBC: 8.4 10*3/uL (ref 4.0–10.5)
nRBC: 0 % (ref 0.0–0.2)

## 2023-03-21 LAB — D-DIMER, QUANTITATIVE: D-Dimer, Quant: 0.34 ug/mL-FEU (ref 0.00–0.50)

## 2023-03-21 LAB — ECHOCARDIOGRAM COMPLETE
Area-P 1/2: 3.56 cm2
Height: 66 in
S' Lateral: 2.9 cm
Weight: 2752 oz

## 2023-03-21 LAB — LIPASE, BLOOD: Lipase: 56 U/L — ABNORMAL HIGH (ref 11–51)

## 2023-03-21 LAB — PROTIME-INR
INR: 1 (ref 0.8–1.2)
Prothrombin Time: 13.7 seconds (ref 11.4–15.2)

## 2023-03-21 LAB — TSH: TSH: 2.567 u[IU]/mL (ref 0.350–4.500)

## 2023-03-21 LAB — GLUCOSE, CAPILLARY: Glucose-Capillary: 165 mg/dL — ABNORMAL HIGH (ref 70–99)

## 2023-03-21 LAB — TROPONIN I (HIGH SENSITIVITY)
Troponin I (High Sensitivity): 38 ng/L — ABNORMAL HIGH (ref <18)
Troponin I (High Sensitivity): 174 ng/L (ref <18)

## 2023-03-21 LAB — HEPARIN LEVEL (UNFRACTIONATED): Heparin Unfractionated: 0.4 IU/mL (ref 0.30–0.70)

## 2023-03-21 LAB — CBG MONITORING, ED: Glucose-Capillary: 91 mg/dL (ref 70–99)

## 2023-03-21 MED ORDER — SODIUM CHLORIDE 0.9 % WEIGHT BASED INFUSION
1.0000 mL/kg/h | INTRAVENOUS | Status: DC
  Administered 2023-03-22: 1 mL/kg/h via INTRAVENOUS

## 2023-03-21 MED ORDER — SODIUM CHLORIDE 0.9 % WEIGHT BASED INFUSION: 3.0000 mL/kg/h | INTRAVENOUS | Status: DC

## 2023-03-21 MED ORDER — SODIUM CHLORIDE 0.9% FLUSH: 3.0000 mL | Freq: Two times a day (BID) | INTRAVENOUS | Status: DC

## 2023-03-21 MED ORDER — PRAVASTATIN SODIUM 40 MG PO TABS: 40.0000 mg | ORAL_TABLET | Freq: Every day | ORAL | Status: DC

## 2023-03-21 MED ORDER — ASPIRIN 81 MG PO CHEW
81.0000 mg | CHEWABLE_TABLET | ORAL | Status: AC
  Administered 2023-03-22: 81 mg via ORAL
  Filled 2023-03-21: qty 1

## 2023-03-21 MED ORDER — ONDANSETRON HCL 4 MG/2ML IJ SOLN: 4.0000 mg | Freq: Four times a day (QID) | INTRAMUSCULAR | Status: DC | PRN

## 2023-03-21 MED ORDER — ASPIRIN 81 MG PO CHEW: 81.0000 mg | CHEWABLE_TABLET | ORAL | Status: DC

## 2023-03-21 MED ORDER — SODIUM CHLORIDE 0.9 % IV SOLN: 250.0000 mL | INTRAVENOUS | Status: DC | PRN

## 2023-03-21 MED ORDER — ASPIRIN 300 MG RE SUPP: 300.0000 mg | RECTAL | Status: AC

## 2023-03-21 MED ORDER — ACETAMINOPHEN 325 MG PO TABS: 650.0000 mg | ORAL_TABLET | ORAL | Status: DC | PRN

## 2023-03-21 MED ORDER — ASPIRIN 81 MG PO CHEW
324.0000 mg | CHEWABLE_TABLET | ORAL | Status: AC
  Administered 2023-03-21: 324 mg via ORAL
  Filled 2023-03-21: qty 4

## 2023-03-21 MED ORDER — IOHEXOL 350 MG/ML SOLN
75.0000 mL | Freq: Once | INTRAVENOUS | Status: AC | PRN
  Administered 2023-03-21: 75 mL via INTRAVENOUS

## 2023-03-21 MED ORDER — SODIUM CHLORIDE 0.9% FLUSH: 3.0000 mL | INTRAVENOUS | Status: DC | PRN

## 2023-03-21 MED ORDER — NITROGLYCERIN 0.4 MG SL SUBL: 0.4000 mg | SUBLINGUAL_TABLET | SUBLINGUAL | Status: DC | PRN

## 2023-03-21 MED ORDER — ASPIRIN 81 MG PO TBEC
81.0000 mg | DELAYED_RELEASE_TABLET | Freq: Every day | ORAL | Status: DC
  Administered 2023-03-23 – 2023-03-24 (×2): 81 mg via ORAL
  Filled 2023-03-21 (×2): qty 1

## 2023-03-21 MED ORDER — SODIUM CHLORIDE 0.9 % WEIGHT BASED INFUSION
3.0000 mL/kg/h | INTRAVENOUS | Status: DC
  Administered 2023-03-22: 3 mL/kg/h via INTRAVENOUS

## 2023-03-21 MED ORDER — ALUM & MAG HYDROXIDE-SIMETH 200-200-20 MG/5ML PO SUSP
30.0000 mL | Freq: Once | ORAL | Status: AC
  Administered 2023-03-21: 30 mL via ORAL
  Filled 2023-03-21: qty 30

## 2023-03-21 MED ORDER — INSULIN ASPART 100 UNIT/ML IJ SOLN
0.0000 [IU] | Freq: Three times a day (TID) | INTRAMUSCULAR | Status: DC
  Administered 2023-03-22 – 2023-03-24 (×3): 1 [IU] via SUBCUTANEOUS

## 2023-03-21 MED ORDER — SODIUM CHLORIDE 0.9 % WEIGHT BASED INFUSION: 1.0000 mL/kg/h | INTRAVENOUS | Status: DC

## 2023-03-21 MED ORDER — METOPROLOL TARTRATE 12.5 MG HALF TABLET
12.5000 mg | ORAL_TABLET | Freq: Two times a day (BID) | ORAL | Status: DC
  Administered 2023-03-21 – 2023-03-23 (×5): 12.5 mg via ORAL
  Filled 2023-03-21 (×6): qty 1

## 2023-03-21 MED ORDER — HEPARIN (PORCINE) 25000 UT/250ML-% IV SOLN
900.0000 [IU]/h | INTRAVENOUS | Status: DC
  Administered 2023-03-21: 900 [IU]/h via INTRAVENOUS
  Filled 2023-03-21: qty 250

## 2023-03-21 MED ORDER — LEVOTHYROXINE SODIUM 50 MCG PO TABS
50.0000 ug | ORAL_TABLET | Freq: Every day | ORAL | Status: DC
  Administered 2023-03-22 – 2023-03-24 (×3): 50 ug via ORAL
  Filled 2023-03-21 (×3): qty 1

## 2023-03-21 MED ORDER — HEPARIN BOLUS VIA INFUSION
4000.0000 [IU] | Freq: Once | INTRAVENOUS | Status: AC
  Administered 2023-03-21: 4000 [IU] via INTRAVENOUS
  Filled 2023-03-21: qty 4000

## 2023-03-21 MED ORDER — LIDOCAINE VISCOUS HCL 2 % MT SOLN
15.0000 mL | Freq: Once | OROMUCOSAL | Status: AC
  Administered 2023-03-21: 15 mL via ORAL
  Filled 2023-03-21: qty 15

## 2023-03-21 NOTE — Progress Notes (Addendum)
Notified by cat lab, patient will re-scheduled for [address] [facility] tomorrow. Will resume PO intake. NPO after midnight. Will Stop IVF, pre-cath order re-entered. ER nurse and patient notified.

## 2023-03-21 NOTE — ED Notes (Signed)
Troponin 174. MD made aware.

## 2023-03-21 NOTE — ED Notes (Signed)
Pt transported to echo 

## 2023-03-21 NOTE — H&P (Addendum)
Cardiology H&P    Patient ID: Suzanne Sutton MRN: [id number]; DOB: 1946-01-19  Admit date: 03/21/2023 Date of Consult: 03/21/2023  PCP:  Moshe Cipro, NP   [facility] Providers Cardiologist:  New to Dr Allyson Sabal    CC: chest tightness    Patient Profile:   Suzanne Sutton is a 77 y.o. female with a hx of HTN, HLD, hypothyroidism, type 2 DM,  who is being seen 03/21/2023 for the evaluation of chest pain and elevated troponin  at the request of Dr Rush Landmark.  History of Present Illness:   Suzanne Sutton with above PMH presented to ER c/o chest pain. She has been having mid-sternum chest tightness for the past 1-2 weeks. It mostly occurring at night before she goes to bed. It's associated with significant daytime fatigue, SOB, bilateral hands and arm numbness. She had been taking aspirin daily for the past few days. She had used her breathing treatment for allergy which seems helping the SOB. She has been experiencing more stress and anxiety at work, and working as a Immunologist. She woke up this morning with chest tightness, around 6 am, pain was persistent and therefore she came to ER. She states her BP has been elevated for the past week, up to 170 systolic. She reports occasional swelling of ankles after a long work day. She recalls rheumatic fever when she was 77 yrs old. She denied any known cardiac disease. She states her father died of MI at age of 68. She denied fever, chills, emesis, abdominal pain, diarrhea. She reports a chronic cough from allergy. She denied any known drug allergy, denied any hx of major bleeding. She denied smoking, ETOH use, illicit drug use.   Admission diagnostic showed glucose 213, CMP otherwise unremarkable. Lipase 56. Hs trop 38 >174. CBC with Hgb 15.3, grossly unremarkable. D dimer negative. CXR showed No evidence of an acute cardiopulmonary abnormality. Indeterminate 4.2 cm ovoid, peripherally calcified lesion in the left upper quadrant of  the abdomen. CT AP is pending. EKG showed sinus rhythm, artifacts, manual QT 400 msec.   Per chart review, she has no known cardiac disease. She has type 2 DM that is managed by metformin, no recent A1C available, 6.5% in 2016 . She takes benazepril-hydrochlorothiazide for HTN and pravastatin for HLD.She has no hx MI, CVA, CKD.    Past Medical History:  Diagnosis Date   Uterine cancer (HCC)        Home Medications:  Prior to Admission medications   Medication Sig Start Date End Date Taking? Authorizing Provider  aspirin 325 MG tablet Take 162.5 mg by mouth at bedtime.   Yes [provider]  azelastine (ASTELIN) 137 MCG/SPRAY nasal spray Place 1 spray into the nose daily as needed for allergies (post nasal drip). Use in each nostril as directed   Yes [provider]  benazepril-hydrochlorthiazide (LOTENSIN HCT) 20-25 MG per tablet Take 1 tablet by mouth daily.   Yes [provider]  Beta Carotene (VITAMIN A) 25000 UNIT capsule Take 25,000 Units by mouth daily.   Yes [provider]  clonazePAM (KLONOPIN) 0.5 MG tablet Take 0.25 mg by mouth at bedtime as needed for anxiety (sleep).   Yes [provider]  ibuprofen (ADVIL) 200 MG tablet Take 400 mg by mouth daily.   Yes [provider]  levothyroxine (SYNTHROID, LEVOTHROID) 50 MCG tablet TAKE 1 TABLET (50 MCG TOTAL) BY MOUTH DAILY BEFORE BREAKFAST. 07/09/15  Yes Reather Littler, MD  metFORMIN (GLUCOPHAGE)  500 MG tablet Take 500-1,000 mg by mouth See admin instructions. 500 mg at lunch, 1000 mg with dinner.   Yes [provider]  potassium chloride SA (K-DUR,KLOR-CON) 20 MEQ tablet Take 1 tablet (20 mEq total) by mouth daily. Patient taking differently: Take 20 mEq by mouth daily with lunch. 05/30/15  Yes Reather Littler, MD  pravastatin (PRAVACHOL) 40 MG tablet Take 40 mg by mouth daily.   Yes [provider]  PROAIR HFA 108 (90 BASE) MCG/ACT inhaler INHALE 1 TO 2 PUFFS BY MOUTH  EVERY 6 HOURS AS NEEDED 04/11/15  Yes [provider]  glucose blood (FREESTYLE TEST STRIPS) test strip USE AS INSTRUCTED TO CHECK BLOOD SUGAR ONCE DAILY DX CODE E11.9 12/25/14   Reather Littler, MD    Inpatient Medications: Scheduled Meds:  aspirin  324 mg Oral NOW   Or   aspirin  300 mg Rectal NOW   [START ON 03/22/2023] aspirin EC  81 mg Oral Daily   insulin aspart  0-6 Units Subcutaneous TID WC   [START ON 03/22/2023] levothyroxine  50 mcg Oral QAC breakfast   metoprolol tartrate  12.5 mg Oral BID   pravastatin  40 mg Oral Daily   Continuous Infusions:  PRN Meds: acetaminophen, nitroGLYCERIN, ondansetron (ZOFRAN) IV  Allergies:    Allergies  Allergen Reactions   Cozaar [Losartan Potassium] Swelling    Had knee swelling   Crestor [Rosuvastatin] Swelling    Leg swelling    Social History:   Social History   Socioeconomic History   Marital status: Widowed    Spouse name: Not on file   Number of children: Not on file   Years of education: Not on file   Highest education level: Not on file  Occupational History   Not on file  Tobacco Use   Smoking status: Never   Smokeless tobacco: Never  Substance and Sexual Activity   Alcohol use: Not on file   Drug use: Not on file   Sexual activity: Not on file  Other Topics Concern   Not on file  Social History Narrative   Not on file   Social Determinants of Health   Financial Resource Strain: Not on file  Food Insecurity: Not on file  Transportation Needs: Not on file  Physical Activity: Not on file  Stress: Not on file  Social Connections: Not on file  Intimate Partner Violence: Not on file    Family History:    Family History  Problem Relation Age of Onset   Heart disease Mother    Heart disease Father      ROS:  Constitutional: Denied fever, chills, malaise, night sweats Eyes: Denied vision change or loss Ears/Nose/Mouth/Throat: Denied ear ache, sore throat, coughing, sinus pain Cardiovascular: see HPI   Respiratory: see HPI  Gastrointestinal: Denied nausea, vomiting, abdominal pain, diarrhea Genital/Urinary: Denied dysuria, hematuria, urinary frequency/urgency Musculoskeletal: Denied muscle ache, joint pain, weakness Skin: Denied rash, wound Neuro: Denied headache, dizziness, syncope Psych: anxiety  Endocrine: history of diabetes   Physical Exam/Data:   Vitals:   03/21/23 1030 03/21/23 1100 03/21/23 1210 03/21/23 1230  BP: (!) 143/72 139/64  (!) 145/64  Pulse: 79 67  65  Resp: 20 (!) 22  (!) 21  Temp:   98.5 F (36.9 C)   TempSrc:   Oral   SpO2: 99% 100%  97%  Weight:      Height:       No intake or output data in the 24 hours ending 03/21/23  1350    03/21/2023    9:01 AM 05/30/2015    1:50 PM 11/15/2014    3:03 PM  Last 3 Weights  Weight (lbs) 172 lb 181 lb 12.8 oz 182 lb  Weight (kg) 78.019 kg 82.464 kg 82.555 kg     Body mass index is 27.76 kg/m.   Vitals:  Vitals:   03/21/23 1210 03/21/23 1230  BP:  (!) 145/64  Pulse:  65  Resp:  (!) 21  Temp: 98.5 F (36.9 C)   SpO2:  97%   General Appearance: In no apparent distress, laying in bed, well nourished  HEENT: Normocephalic, atraumatic.  Neck: Supple, trachea midline, no JVDs Cardiovascular: Regular rate and rhythm, normal S1-S2,  no murmur Respiratory: Resting breathing unlabored, lungs sounds clear to auscultation bilaterally, no use of accessory muscles. On room air.  No wheezes, rales or rhonchi.   Gastrointestinal: Bowel sounds positive, abdomen soft, non-tender, non-distended.  Extremities: Able to move all extremities in bed without difficulty, no edema of BLE Musculoskeletal: Normal muscle bulk and tone Skin: Intact, warm, dry. No rashes or petechiae noted in exposed areas.  Neurologic: Alert, oriented to person, place and time. Fluent speech,no cognitive deficit, no gross focal neuro deficit Psychiatric: Normal affect. Mood is appropriate.     EKG:  The EKG was personally reviewed and demonstrates:     EKG today showed sinus rhythm 74bpm, artifacts, manual QT 400 msec.  Telemetry:  Telemetry was personally reviewed and demonstrates:    Sinus rhythm   Relevant CV Studies:   No previous workup   Laboratory Data:  High Sensitivity Troponin:   Recent Labs  Lab 03/21/23 0920 03/21/23 1105  TROPONINIHS 38* 174*     Chemistry Recent Labs  Lab 03/21/23 0920  NA 137  K 3.8  CL 100  CO2 29  GLUCOSE 213*  BUN 12  CREATININE 0.85  CALCIUM 9.5  GFRNONAA >60  ANIONGAP 8    Recent Labs  Lab 03/21/23 0920  PROT 6.6  ALBUMIN 4.0  AST 22  ALT 18  ALKPHOS 38  BILITOT 0.4   Lipids No results for input(s): "CHOL", "TRIG", "HDL", "LABVLDL", "LDLCALC", "CHOLHDL" in the last 168 hours.  Hematology Recent Labs  Lab 03/21/23 0920  WBC 8.4  RBC 4.99  HGB 15.3*  HCT 45.7  MCV 91.6  MCH 30.7  MCHC 33.5  RDW 12.7  PLT 219   Thyroid No results for input(s): "TSH", "FREET4" in the last 168 hours.  BNPNo results for input(s): "BNP", "PROBNP" in the last 168 hours.  DDimer  Recent Labs  Lab 03/21/23 1020  DDIMER 0.34     Radiology/Studies:  CT CHEST ABDOMEN PELVIS W CONTRAST  Result Date: 03/21/2023 CLINICAL DATA:  Cough and fatigue with abnormal chest x-ray. EXAM: CT CHEST, ABDOMEN, AND PELVIS WITH CONTRAST TECHNIQUE: Multidetector CT imaging of the chest, abdomen and pelvis was performed following the standard protocol during bolus administration of intravenous contrast. RADIATION DOSE REDUCTION: This exam was performed according to the departmental dose-optimization program which includes automated exposure control, adjustment of the mA and/or kV according to patient size and/or use of iterative reconstruction technique. CONTRAST:  75mL OMNIPAQUE IOHEXOL 350 MG/ML SOLN COMPARISON:  None Available. FINDINGS: CT CHEST FINDINGS Cardiovascular: No acute vascular findings. Normal heart size. No pericardial effusion. Atheromatous calcification of the aorta and coronaries  which is extensive. Mediastinum/Nodes: No mass or adenopathy Lungs/Pleura: Mild linear scarring in the right middle lobe and lingula. There are small scattered pulmonary nodules  measuring up to 5 mm in left lower lobe on 4:108. Additional dominant nodules highlighted on series 4 including in the left lower lobe on 4:83, right upper lobe on 4:69, and paramediastinal right lower lobe on 4:106. There is no edema, consolidation, effusion, or pneumothorax. Musculoskeletal: Multilevel bridging thoracic osteophytes. Advanced disc degeneration at T11-12. L3-4 and L4-5 high-grade degenerative spinal stenosis. CT ABDOMEN PELVIS FINDINGS Hepatobiliary: No focal liver abnormality.Cholecystectomy. No biliary dilatation. Pancreas: Unremarkable. Spleen: Eggshell calcification around the cystic density in the upper spleen measuring 4 cm. Adrenals/Urinary Tract: Negative adrenals. No hydronephrosis or stone. Unremarkable bladder. Stomach/Bowel: No obstruction. No appendicitis. Moderate stool throughout the colon Vascular/Lymphatic: Diffuse atheromatous calcification of the aorta. No mass or adenopathy. Reproductive:Hysterectomy. Other: No ascites or pneumoperitoneum. Musculoskeletal: No acute finding. Degenerative changes described above. IMPRESSION: 1. No acute finding.  Negative for pneumonia. 2. The left upper quadrant calcification on prior radiograph is a benign splenic pseudocyst. 3. Multiple pulmonary nodules measuring up to 5 mm. No follow-up needed if patient is low-risk (and has no known or suspected primary neoplasm). Non-contrast chest CT can be considered in 12 months if patient is high-risk. This recommendation follows the consensus statement: Guidelines for Management of Incidental Pulmonary Nodules Detected on CT Images: From the Fleischner Society 2017; Radiology 2017; 284:228-243. 4. Extensive atherosclerosis including the coronary arteries. Electronically Signed   By: Tiburcio Pea M.D.   On: 03/21/2023 13:02    DG Chest 2 View  Result Date: 03/21/2023 CLINICAL DATA:  Provided history: Chest pain. EXAM: CHEST - 2 VIEW COMPARISON:  Prior chest radiographs 10/09/2021 and earlier. FINDINGS: Heart size within normal limits. Aortic atherosclerosis. No appreciable airspace consolidation or pulmonary edema. No evidence of pleural effusion or pneumothorax. No acute osseous abnormality identified. Degenerative changes of the spine. Indeterminate 4.2 cm ovoid, peripherally calcified lesion in the left upper quadrant of the abdomen. IMPRESSION: 1. No evidence of an acute cardiopulmonary abnormality. 2. Aortic Atherosclerosis (ICD10-I70.0). 3. Indeterminate 4.2 cm ovoid, peripherally calcified lesion in the left upper quadrant of the abdomen. A contrast-enhanced abdominal CT is recommended for further evaluation. Electronically Signed   By: Jackey Loge D.O.   On: 03/21/2023 10:26     Assessment and Plan:   NSTEMI - presented with 1-2 week onset of chest tightness and paresthesia before bedtime  - Hs trop 38 >174 - EKG no acute findings - Will admit to telemetry - will check Echo, TSH, A1C, Lipid panel  - recommend ischemic evaluation with cardiac catheterization, NPO,  LHC planned today  - medical therapy: start heparin gtt,  metoprolol 12.5mg  BID, ASA 81mg  daily, continue pravastatin 40mg  daily, more rec to follow after cath   HTN - BP mildly elevated, hold home  benazepril-hydrochlorothiazide, start metoprolol 12.5mg  BID  HLD - check lipid panel and LPA - resume PTA pravastatin    Type 2 DM  - check A1C - Hold PTA metformin - start insulin SSI AC while in house - [facility] diet when PO intake allows   Hypothyroidism  - Check TSH - resume PTA levothyroxine 50 mcg daily   Splenic pseudocyst Pulmonary nodule  - Indeterminate 4.2 cm ovoid, peripherally calcified lesion in the left upper quadrant of the abdomen per radiology report  - CTAP showed no acute finding, the left upper quadrant calcification on  prior radiograph is a benign splenic pseudocyst. Multiple pulmonary nodules measuring up to 5 mm.  - non smoking, low risk, yearly CT per PCP    AD: Full code ; she is  [employer]'s witness, does not consent for blood transfusion DVT ppx: heparin gtt  Dispo: likely in 1- 2 days    Informed Consent   Shared Decision Making/Informed Consent{  The risks [stroke (1 in 1000), death (1 in 1000), kidney failure [usually temporary] (1 in 500), bleeding (1 in 200), allergic reaction [possibly serious] (1 in 200)], benefits (diagnostic support and management of coronary artery disease) and alternatives of a cardiac catheterization were discussed in detail with Ms. Norgren and she is willing to proceed.       Risk Assessment/Risk Scores:    TIMI Risk Score for Unstable Angina or Non-ST Elevation MI:   The patient's TIMI risk score is 4, which indicates a 20% risk of all cause mortality, new or recurrent myocardial infarction or need for urgent revascularization in the next 14 days.{    Severity of Illness: The appropriate patient status for this patient is INPATIENT. Inpatient status is judged to be reasonable and necessary in order to provide the required intensity of service to ensure the patient's safety. The patient's presenting symptoms, physical exam findings, and initial radiographic and laboratory data in the context of their chronic comorbidities is felt to place them at high risk for further clinical deterioration. Furthermore, it is not anticipated that the patient will be medically stable for discharge from the hospital within 2 midnights of admission.   * I certify that at the point of admission it is my clinical judgment that the patient will require inpatient hospital care spanning beyond 2 midnights from the point of admission due to high intensity of service, high risk for further deterioration and high frequency of surveillance required.*   For questions or updates, please contact [facility] Please consult [email or web address] for contact info under    Signed, Cyndi Bender, NP  03/21/2023 1:50 PM  Agree with note by Lethea Killings NP  Patient is a healthy-appearing 77 year old Caucasian female with positive cardiac risk factors (hypertension, hyperlipidemia, type 2 diabetes), and family history) and new onset symptoms over the last several weeks culminating in chest pain this morning.  Her enzymes rose to the 170 range.  She is currently pain-free.  Her EKG shows no acute changes.  Chest CT did show coronary calcifications without evidence of pulmonary emboli.  Her exam is benign.  Will start IV heparin given she has a "non-STEMI" and arrange for her to undergo coronary angiography this afternoon.  Runell Gess, M.D., FACP, [facility], [facility] [address] [place], [place]  [address]  [phone] 03/21/2023 3:07 PM

## 2023-03-21 NOTE — ED Notes (Signed)
ED TO INPATIENT HANDOFF REPORT  ED Nurse Name and Phone #: Morrie Sheldon [phone]  R Name/Age/Gender Suzanne Sutton 77 y.o. female Room/Bed: 042C/042C  Code Status   Code Status: Full Code  Home/SNF/Other Home Patient oriented to: self, place, time, and situation Is this baseline? Yes   Triage Complete: Triage complete  Chief Complaint NSTEMI (non-ST elevated myocardial infarction) [facility]) [I21.4]  Triage Note Pt. Stated, Suzanne Sutton had chest pain off and on for 2 weeks and it was probably stress. For the last 2 days its been worse and a lot of pressure in my chest with a lot of indigestion. Ive had a lot of stress that's due to it. Ive had several times of tingling in my arms and goes away. I try breathing exercise ti help.    Allergies Allergies  Allergen Reactions   Cozaar [Losartan Potassium] Swelling    Had knee swelling   Crestor [Rosuvastatin] Swelling    Leg swelling    Level of Care/Admitting Diagnosis ED Disposition     ED Disposition  Admit   Condition  --   Comment  Hospital Area: [facility] [[id number]]  Level of Care: Telemetry Cardiac [[id number]]  May admit patient to [facility] or [facility] if equivalent level of care is available:: No  Covid Evaluation: Asymptomatic - no recent exposure (last 10 days) testing not required  Diagnosis: NSTEMI (non-ST elevated myocardial infarction) [facility]) [[id number]]  Admitting Physician: Runell Gess [[id number]]  Attending Physician: Runell Gess [phone]  Certification:: I certify this patient will need inpatient services for at least 2 midnights  Estimated Length of Stay: 2          B Medical/Surgery History Past Medical History:  Diagnosis Date   Uterine cancer (HCC)    Past Surgical History:  Procedure Laterality Date   ABDOMINAL HYSTERECTOMY     CHOLECYSTECTOMY     SPINE SURGERY       A IV Location/Drains/Wounds Patient Lines/Drains/Airways Status     Active Line/Drains/Airways     Name Placement  date Placement time Site Days   Peripheral IV 03/21/23 20 G Right Antecubital 03/21/23  1207  Antecubital  less than 1            Intake/Output Last 24 hours No intake or output data in the 24 hours ending 03/21/23 2020  Labs/Imaging Results for orders placed or performed during the hospital encounter of 03/21/23 (from the past 48 hour(s))  CBC     Status: Abnormal   Collection Time: 03/21/23  9:20 AM  Result Value Ref Range   WBC 8.4 4.0 - 10.5 K/uL   RBC 4.99 3.87 - 5.11 MIL/uL   Hemoglobin 15.3 (H) 12.0 - 15.0 g/dL   HCT 91.4 78.2 - 95.6 %   MCV 91.6 80.0 - 100.0 fL   MCH 30.7 26.0 - 34.0 pg   MCHC 33.5 30.0 - 36.0 g/dL   RDW 21.3 08.6 - 57.8 %   Platelets 219 150 - 400 K/uL   nRBC 0.0 0.0 - 0.2 %    Comment: Performed at [facility], [address], [place], [place] [address]  Troponin I (High Sensitivity)     Status: Abnormal   Collection Time: 03/21/23  9:20 AM  Result Value Ref Range   Troponin I (High Sensitivity) 38 (H) <18 ng/L    Comment: (NOTE) Elevated high sensitivity troponin I (hsTnI) values and significant  changes across serial measurements may suggest ACS but many other  chronic and acute conditions are known to elevate hsTnI results.  Refer to the "Links" section for chest pain algorithms and additional  guidance. Performed at [facility], [address] [place], [place] [address]   Lipase, blood     Status: Abnormal   Collection Time: 03/21/23  9:20 AM  Result Value Ref Range   Lipase 56 (H) 11 - 51 U/L    Comment: Performed at [facility], [address], [place], [place] [address]  Brain natriuretic peptide     Status: None   Collection Time: 03/21/23  9:20 AM  Result Value Ref Range   B Natriuretic Peptide 20.2 0.0 - 100.0 pg/mL    Comment: Performed at [facility], [address], [place], [place] [address]  Basic metabolic panel     Status: Abnormal   Collection Time: 03/21/23  9:20 AM  Result Value Ref Range    Sodium 137 135 - 145 mmol/L   Potassium 3.8 3.5 - 5.1 mmol/L   Chloride 100 98 - 111 mmol/L   CO2 29 22 - 32 mmol/L   Glucose, Bld 213 (H) 70 - 99 mg/dL    Comment: Glucose reference range applies only to samples taken after fasting for at least 8 hours.   BUN 12 8 - 23 mg/dL   Creatinine, Ser 9.14 0.44 - 1.00 mg/dL   Calcium 9.5 8.9 - 78.2 mg/dL   GFR, Estimated >95 >62 mL/min    Comment: (NOTE) Calculated using the CKD-EPI Creatinine Equation (2021)    Anion gap 8 5 - 15    Comment: Performed at [facility], [address], [place], [place] [address]  Hepatic function panel     Status: None   Collection Time: 03/21/23  9:20 AM  Result Value Ref Range   Total Protein 6.6 6.5 - 8.1 g/dL   Albumin 4.0 3.5 - 5.0 g/dL   AST 22 15 - 41 U/L   ALT 18 0 - 44 U/L   Alkaline Phosphatase 38 38 - 126 U/L   Total Bilirubin 0.4 0.3 - 1.2 mg/dL   Bilirubin, Direct <5.7 0.0 - 0.2 mg/dL   Indirect Bilirubin NOT CALCULATED 0.3 - 0.9 mg/dL    Comment: Performed at [facility], [address] [place], [place] [address]  D-dimer, quantitative     Status: None   Collection Time: 03/21/23 10:20 AM  Result Value Ref Range   D-Dimer, Quant 0.34 0.00 - 0.50 ug/mL-FEU    Comment: (NOTE) At the manufacturer cut-off value of 0.5 g/mL FEU, this assay has a negative predictive value of 95-100%.This assay is intended for use in conjunction with a clinical pretest probability (PTP) assessment model to exclude pulmonary embolism (PE) and deep venous thrombosis (DVT) in outpatients suspected of PE or DVT. Results should be correlated with clinical presentation. Performed at [facility], [address] [place], [place] [address]   Troponin I (High Sensitivity)     Status: Abnormal   Collection Time: 03/21/23 11:05 AM  Result Value Ref Range   Troponin I (High Sensitivity) 174 (HH) <18 ng/L    Comment: CRITICAL RESULT CALLED TO, READ BACK BY AND VERIFIED WITH Reine Just, RN [phone]  03/21/23 L. KLAR (NOTE) Elevated high sensitivity troponin I (hsTnI) values and significant  changes across serial measurements may suggest ACS but many other  chronic and acute conditions are known to elevate hsTnI results.  Refer to the "Links" section  for chest pain algorithms and additional  guidance. Performed at [facility], [address], [place], [place] [address]   TSH     Status: None   Collection Time: 03/21/23  1:45 PM  Result Value Ref Range   TSH 2.567 0.350 - 4.500 uIU/mL    Comment: Performed by a 3rd Generation assay with a functional sensitivity of <=0.01 uIU/mL. Performed at [facility], [address] [place], [place] [address]   Protime-INR     Status: None   Collection Time: 03/21/23  1:45 PM  Result Value Ref Range   Prothrombin Time 13.7 11.4 - 15.2 seconds   INR 1.0 0.8 - 1.2    Comment: (NOTE) INR goal varies based on device and disease states. Performed at [facility], [address], [place], [place] [address]   CBG monitoring, ED     Status: None   Collection Time: 03/21/23  5:43 PM  Result Value Ref Range   Glucose-Capillary 91 70 - 99 mg/dL    Comment: Glucose reference range applies only to samples taken after fasting for at least 8 hours.   ECHOCARDIOGRAM COMPLETE  Result Date: 03/21/2023    ECHOCARDIOGRAM REPORT   Patient Name:   Suzanne Sutton Date of Exam: 03/21/2023 Medical Rec #:  [id number]     Height:       66.0 in Accession #:    [id number]    Weight:       172.0 lb Date of Birth:  1946/06/04    BSA:          1.876 m Patient Age:    76 years      BP:           166/82 mmHg Patient Gender: F             HR:           67 bpm. Exam Location:  Inpatient Procedure: 2D Echo, Cardiac Doppler and Color Doppler Indications:    NSTEMI  History:        Patient has no prior history of Echocardiogram examinations.                 Risk Factors:Diabetes and Hypertension.  Sonographer:    Melissa Morford RDCS (AE, PE) Referring Phys:  [id number] Cyndi Bender IMPRESSIONS  1. Left ventricular ejection fraction, by estimation, is 55 to 60%. The left ventricle has normal function. The left ventricle has no regional wall motion abnormalities. Left ventricular diastolic parameters are consistent with Grade I diastolic dysfunction (impaired relaxation).  2. Right ventricular systolic function is normal. The right ventricular size is normal.  3. The mitral valve is normal in structure. No evidence of mitral valve regurgitation. No evidence of mitral stenosis.  4. The aortic valve is normal in structure. Aortic valve regurgitation is not visualized. No aortic stenosis is present.  5. The inferior vena cava is normal in size with greater than 50% respiratory variability, suggesting right atrial pressure of 3 mmHg. FINDINGS  Left Ventricle: Left ventricular ejection fraction, by estimation, is 55 to 60%. The left ventricle has normal function. The left ventricle has no regional wall motion abnormalities. The left ventricular internal cavity size was normal in size. There is  no left ventricular hypertrophy. Left ventricular diastolic parameters are consistent with Grade I diastolic dysfunction (impaired relaxation). Right Ventricle: The right ventricular size is normal. No increase in right ventricular wall thickness. Right ventricular systolic function is normal.  Left Atrium: Left atrial size was normal in size. Right Atrium: Right atrial size was normal in size. Pericardium: There is no evidence of pericardial effusion. Mitral Valve: The mitral valve is normal in structure. Mild to moderate mitral annular calcification. No evidence of mitral valve regurgitation. No evidence of mitral valve stenosis. Tricuspid Valve: The tricuspid valve is normal in structure. Tricuspid valve regurgitation is not demonstrated. No evidence of tricuspid stenosis. Aortic Valve: The aortic valve is normal in structure. Aortic valve regurgitation is not visualized. No aortic stenosis  is present. Pulmonic Valve: The pulmonic valve was not well visualized. Pulmonic valve regurgitation is not visualized. No evidence of pulmonic stenosis. Aorta: The aortic root is normal in size and structure. Venous: The inferior vena cava is normal in size with greater than 50% respiratory variability, suggesting right atrial pressure of 3 mmHg. IAS/Shunts: No atrial level shunt detected by color flow Doppler.  LEFT VENTRICLE PLAX 2D LVIDd:         3.80 cm     Diastology LVIDs:         2.90 cm     LV e' medial:    5.11 cm/s LV PW:         1.00 cm     LV E/e' medial:  10.1 LV IVS:        1.10 cm     LV e' lateral:   4.79 cm/s LVOT diam:     2.10 cm     LV E/e' lateral: 10.8 LV SV:         65 LV SV Index:   35 LVOT Area:     3.46 cm  LV Volumes (MOD) LV vol d, MOD A4C: 44.1 ml LV SV MOD A4C:     44.1 ml RIGHT VENTRICLE RV S prime:     9.36 cm/s TAPSE (M-mode): 2.3 cm LEFT ATRIUM             Index        RIGHT ATRIUM           Index LA diam:        3.30 cm 1.76 cm/m   RA Area:     13.40 cm LA Vol (A2C):   27.8 ml 14.82 ml/m  RA Volume:   31.90 ml  17.01 ml/m LA Vol (A4C):   34.0 ml 18.13 ml/m LA Biplane Vol: 33.5 ml 17.86 ml/m  AORTIC VALVE LVOT Vmax:   68.50 cm/s LVOT Vmean:  53.100 cm/s LVOT VTI:    0.189 m  AORTA Ao Root diam: 2.90 cm Ao Asc diam:  3.00 cm MITRAL VALVE MV Area (PHT): 3.56 cm     SHUNTS MV Decel Time: 213 msec     Systemic VTI:  0.19 m MV E velocity: 51.80 cm/s   Systemic Diam: 2.10 cm MV A velocity: 103.00 cm/s MV E/A ratio:  0.50 Arvilla Meres MD Electronically signed by Arvilla Meres MD Signature Date/Time: 03/21/2023/5:35:42 PM    Final    CT CHEST ABDOMEN PELVIS W CONTRAST  Result Date: 03/21/2023 CLINICAL DATA:  Cough and fatigue with abnormal chest x-ray. EXAM: CT CHEST, ABDOMEN, AND PELVIS WITH CONTRAST TECHNIQUE: Multidetector CT imaging of the chest, abdomen and pelvis was performed following the standard protocol during bolus administration of intravenous contrast.  RADIATION DOSE REDUCTION: This exam was performed according to the departmental dose-optimization program which includes automated exposure control, adjustment of the mA and/or kV according to patient size and/or use of iterative reconstruction technique. CONTRAST:  75mL OMNIPAQUE IOHEXOL 350 MG/ML SOLN COMPARISON:  None Available. FINDINGS: CT CHEST FINDINGS Cardiovascular: No acute vascular findings. Normal heart size. No pericardial effusion. Atheromatous calcification of the aorta and coronaries which is extensive. Mediastinum/Nodes: No mass or adenopathy Lungs/Pleura: Mild linear scarring in the right middle lobe and lingula. There are small scattered pulmonary nodules measuring up to 5 mm in left lower lobe on 4:108. Additional dominant nodules highlighted on series 4 including in the left lower lobe on 4:83, right upper lobe on 4:69, and paramediastinal right lower lobe on 4:106. There is no edema, consolidation, effusion, or pneumothorax. Musculoskeletal: Multilevel bridging thoracic osteophytes. Advanced disc degeneration at T11-12. L3-4 and L4-5 high-grade degenerative spinal stenosis. CT ABDOMEN PELVIS FINDINGS Hepatobiliary: No focal liver abnormality.Cholecystectomy. No biliary dilatation. Pancreas: Unremarkable. Spleen: Eggshell calcification around the cystic density in the upper spleen measuring 4 cm. Adrenals/Urinary Tract: Negative adrenals. No hydronephrosis or stone. Unremarkable bladder. Stomach/Bowel: No obstruction. No appendicitis. Moderate stool throughout the colon Vascular/Lymphatic: Diffuse atheromatous calcification of the aorta. No mass or adenopathy. Reproductive:Hysterectomy. Other: No ascites or pneumoperitoneum. Musculoskeletal: No acute finding. Degenerative changes described above. IMPRESSION: 1. No acute finding.  Negative for pneumonia. 2. The left upper quadrant calcification on prior radiograph is a benign splenic pseudocyst. 3. Multiple pulmonary nodules measuring up to 5 mm.  No follow-up needed if patient is low-risk (and has no known or suspected primary neoplasm). Non-contrast chest CT can be considered in 12 months if patient is high-risk. This recommendation follows the consensus statement: Guidelines for Management of Incidental Pulmonary Nodules Detected on CT Images: From the Fleischner Society 2017; Radiology 2017; 284:228-243. 4. Extensive atherosclerosis including the coronary arteries. Electronically Signed   By: Tiburcio Pea M.D.   On: 03/21/2023 13:02   DG Chest 2 View  Result Date: 03/21/2023 CLINICAL DATA:  Provided history: Chest pain. EXAM: CHEST - 2 VIEW COMPARISON:  Prior chest radiographs 10/09/2021 and earlier. FINDINGS: Heart size within normal limits. Aortic atherosclerosis. No appreciable airspace consolidation or pulmonary edema. No evidence of pleural effusion or pneumothorax. No acute osseous abnormality identified. Degenerative changes of the spine. Indeterminate 4.2 cm ovoid, peripherally calcified lesion in the left upper quadrant of the abdomen. IMPRESSION: 1. No evidence of an acute cardiopulmonary abnormality. 2. Aortic Atherosclerosis (ICD10-I70.0). 3. Indeterminate 4.2 cm ovoid, peripherally calcified lesion in the left upper quadrant of the abdomen. A contrast-enhanced abdominal CT is recommended for further evaluation. Electronically Signed   By: Jackey Loge D.O.   On: 03/21/2023 10:26    Pending Labs Unresulted Labs (From admission, onward)     Start     Ordered   03/22/23 0500  Lipid panel  Tomorrow morning,   R        03/21/23 1236   03/22/23 0500  Basic metabolic panel  Tomorrow morning,   R        03/21/23 1348   03/22/23 0500  CBC  Tomorrow morning,   R        03/21/23 1348   03/22/23 0500  Heparin level (unfractionated)  Daily,   R      03/21/23 1454   03/21/23 2300  Heparin level (unfractionated)  Once-Timed,   TIMED        03/21/23 1455   03/21/23 1328  Lipoprotein A (LPA)  Once,   URGENT        03/21/23 1327    03/21/23 1236  Hemoglobin A1c  Once,   URGENT        03/21/23  1236            Vitals/Pain Today's Vitals   03/21/23 1500 03/21/23 1515 03/21/23 1947 03/21/23 1949  BP: (!) 163/67 (!) 144/62 135/68   Pulse: 63 60 64   Resp: 16 18 18    Temp:   98.3 F (36.8 C)   TempSrc:   Oral   SpO2: 100% 99% 94%   Weight:      Height:      PainSc:    0-No pain    Isolation Precautions No active isolations  Medications Medications  aspirin chewable tablet 81 mg (0 mg Oral Hold 03/21/23 1446)  aspirin EC tablet 81 mg (has no administration in time range)  nitroGLYCERIN (NITROSTAT) SL tablet 0.4 mg (has no administration in time range)  acetaminophen (TYLENOL) tablet 650 mg (has no administration in time range)  ondansetron (ZOFRAN) injection 4 mg (has no administration in time range)  pravastatin (PRAVACHOL) tablet 40 mg (has no administration in time range)  levothyroxine (SYNTHROID) tablet 50 mcg (has no administration in time range)  metoprolol tartrate (LOPRESSOR) tablet 12.5 mg (12.5 mg Oral Given 03/21/23 1502)  insulin aspart (novoLOG) injection 0-6 Units ( Subcutaneous Not Given 03/21/23 1746)  heparin ADULT infusion 100 units/mL (25000 units/275mL) (900 Units/hr Intravenous New Bag/Given 03/21/23 1502)  alum & mag hydroxide-simeth (MAALOX/MYLANTA) 200-200-20 MG/5ML suspension 30 mL (30 mLs Oral Given 03/21/23 1024)    And  lidocaine (XYLOCAINE) 2 % viscous mouth solution 15 mL (15 mLs Oral Given 03/21/23 1024)  iohexol (OMNIPAQUE) 350 MG/ML injection 75 mL (75 mLs Intravenous Contrast Given 03/21/23 1251)  aspirin chewable tablet 324 mg (324 mg Oral Given 03/21/23 1406)    Or  aspirin suppository 300 mg ( Rectal See Alternative 03/21/23 1406)  heparin bolus via infusion 4,000 Units (4,000 Units Intravenous Bolus from Bag 03/21/23 1502)    Mobility walks     Focused Assessments See chart   R Recommendations: See Admitting Provider Note  Report given to:   Additional Notes: see  chart

## 2023-03-21 NOTE — ED Notes (Signed)
Patient states the pain is worse at night , states no pain the am when waking up.

## 2023-03-21 NOTE — ED Triage Notes (Signed)
Pt. Stated, Ive had chest pain off and on for 2 weeks and it was probably stress. For the last 2 days its been worse and a lot of pressure in my chest with a lot of indigestion. Ive had a lot of stress that's due to it. Ive had several times of tingling in my arms and goes away. I try breathing exercise ti help.

## 2023-03-21 NOTE — Progress Notes (Signed)
ANTICOAGULATION CONSULT NOTE - Initial Consult  Pharmacy Consult for Heparin Indication: Chest pain/ACS  Allergies  Allergen Reactions   Cozaar [Losartan Potassium] Swelling    Had knee swelling   Crestor [Rosuvastatin] Swelling    Leg swelling    Patient Measurements: Height: 5\' 6"  (167.6 cm) Weight: 78 kg (172 lb) IBW/kg (Calculated) : 59.3 Heparin Dosing Weight: 75.3 kg  Vital Signs: Temp: 98.5 F (36.9 C) (07/08 1210) Temp Source: Oral (07/08 1210) BP: 146/71 (07/08 1451) Pulse Rate: 70 (07/08 1452)  Labs: Recent Labs    03/21/23 0920 03/21/23 1105 03/21/23 1345  HGB 15.3*  --   --   HCT 45.7  --   --   PLT 219  --   --   LABPROT  --   --  13.7  INR  --   --  1.0  CREATININE 0.85  --   --   TROPONINIHS 38* 174*  --     Estimated Creatinine Clearance: 58.5 mL/min (by C-G formula based on SCr of 0.85 mg/dL).   Medical History: Past Medical History:  Diagnosis Date   Uterine cancer (HCC)     Assessment: 32 YOF not anticoagulated PTA. Presented w/ chest pain associated w/ fatigue and SOB. CBC wnl, d-dimer neg, CXR w/ no evidence of acute cardiopulmonary abnormalities. No signs/symptoms of bleeding.  Goal of Therapy:  Heparin level 0.3-0.7 units/ml   Plan:  Give 4000 units bolus x 1 Start heparin infusion at 900 units/hr Continue to monitor H&H and platelets Check heparin level every 8 hours  Laqueta Jean PharmD Candidate 03/21/2023 2:57 PM

## 2023-03-21 NOTE — ED Provider Notes (Signed)
[place] EMERGENCY DEPARTMENT AT [facility] Provider Note   CSN: [id number] Arrival date & time: 03/21/23  0848     History  Chief Complaint  Patient presents with   Chest Pain   Shortness of Breath   Anxiety    Suzanne Sutton is a 77 y.o. female.  The history is provided by the patient and medical records. No language interpreter was used.  Chest Pain Pain location:  Substernal area Pain quality: tightness   Pain radiates to:  Does not radiate Pain severity:  Mild Onset quality:  Gradual Duration:  1 week Timing:  Intermittent Progression:  Waxing and waning Chronicity:  New Worsened by:  Nothing Ineffective treatments:  None tried Associated symptoms: anxiety, cough, fatigue, heartburn, lower extremity edema (recently) and shortness of breath   Associated symptoms: no abdominal pain, no back pain, no claudication, no diaphoresis, no fever, no headache, no nausea, no numbness, no palpitations, no vomiting and no weakness   Shortness of Breath Associated symptoms: chest pain and cough   Associated symptoms: no abdominal pain, no claudication, no diaphoresis, no fever, no headaches, no neck pain, no rash and no vomiting   Anxiety Associated symptoms include chest pain and shortness of breath. Pertinent negatives include no abdominal pain and no headaches.       Home Medications Prior to Admission medications   Medication Sig Start Date End Date Taking? Authorizing Provider  azelastine (ASTELIN) 137 MCG/SPRAY nasal spray Place 1 spray into the nose 2 (two) times daily. Use in each nostril as directed    [provider]  benazepril-hydrochlorthiazide (LOTENSIN HCT) 20-25 MG per tablet Take 1 tablet by mouth daily.    [provider]  calcium carbonate (OSCAL) 1500 (600 CA) MG TABS tablet Take by mouth 2 (two) times daily with a meal.    [provider]  Cholecalciferol (VITAMIN D-3) 1000 UNITS CAPS Take 1,000 Units by mouth.     [provider]  clonazePAM (KLONOPIN) 0.5 MG tablet Take 0.5 mg by mouth as needed for anxiety.    [provider]  gabapentin (NEURONTIN) 100 MG capsule TAKE 1 CAPSULE (100 MG TOTAL) BY MOUTH 3 (THREE) TIMES DAILY. 05/30/15   Reather Littler, MD  glucose blood (FREESTYLE TEST STRIPS) test strip USE AS INSTRUCTED TO CHECK BLOOD SUGAR ONCE DAILY DX CODE E11.9 12/25/14   Reather Littler, MD  levothyroxine (SYNTHROID, LEVOTHROID) 50 MCG tablet Take 1 tablet (50 mcg total) by mouth daily before breakfast. 02/13/14   Reather Littler, MD  levothyroxine (SYNTHROID, LEVOTHROID) 50 MCG tablet TAKE 1 TABLET (50 MCG TOTAL) BY MOUTH DAILY BEFORE BREAKFAST. 07/09/15   Reather Littler, MD  levothyroxine (SYNTHROID, LEVOTHROID) 50 MCG tablet TAKE 1 TABLET BY MOUTH ONCE DAILY BEFORE BREAKFAST 06/16/16   Reather Littler, MD  metFORMIN (GLUCOPHAGE) 500 MG tablet Take 500 mg by mouth 2 (two) times daily with a meal. 1 in am and 2 at night    [provider]  pirbuterol (MAXAIR) 200 MCG/INH inhaler Inhale 2 puffs into the lungs 4 (four) times daily.    [provider]  potassium chloride SA (K-DUR,KLOR-CON) 20 MEQ tablet Take 1 tablet (20 mEq total) by mouth daily. 05/30/15   Reather Littler, MD  pravastatin (PRAVACHOL) 40 MG tablet Take 40 mg by mouth daily.    [provider]  PROAIR HFA 108 (90 BASE) MCG/ACT inhaler INHALE 1 TO 2 PUFFS BY MOUTH EVERY 6 HOURS AS NEEDED 04/11/15   [provider]  Allergies    Cozaar [losartan potassium] and Crestor [rosuvastatin]    Review of Systems   Review of Systems  Constitutional:  Positive for fatigue. Negative for diaphoresis and fever.  HENT:  Negative for congestion.   Eyes:  Negative for visual disturbance.  Respiratory:  Positive for cough, chest tightness and shortness of breath.   Cardiovascular:  Positive for chest pain. Negative for palpitations and claudication.  Gastrointestinal:  Positive for heartburn. Negative for abdominal  pain, constipation, diarrhea, nausea and vomiting.  Genitourinary:  Negative for dysuria.  Musculoskeletal:  Negative for back pain, neck pain and neck stiffness.  Skin:  Negative for rash and wound.  Neurological:  Negative for weakness, light-headedness, numbness and headaches.  Psychiatric/Behavioral:  Negative for agitation and confusion.   All other systems reviewed and are negative.   Physical Exam Updated Vital Signs BP 139/64   Pulse 67   Temp 98.5 F (36.9 C) (Oral)   Resp (!) 22   Ht 5\' 6"  (1.676 m)   Wt 78 kg   SpO2 100%   BMI 27.76 kg/m  Physical Exam Vitals and nursing note reviewed.  Constitutional:      General: She is not in acute distress.    Appearance: She is well-developed. She is not ill-appearing, toxic-appearing or diaphoretic.  HENT:     Head: Normocephalic and atraumatic.  Eyes:     Conjunctiva/sclera: Conjunctivae normal.     Pupils: Pupils are equal, round, and reactive to light.  Cardiovascular:     Rate and Rhythm: Normal rate and regular rhythm.     Heart sounds: Normal heart sounds. No murmur heard. Pulmonary:     Effort: Pulmonary effort is normal. No respiratory distress.     Breath sounds: Normal breath sounds. No wheezing, rhonchi or rales.  Chest:     Chest wall: No tenderness.  Abdominal:     Palpations: Abdomen is soft.     Tenderness: There is no abdominal tenderness.  Musculoskeletal:        General: No swelling.     Cervical back: Neck supple.     Right lower leg: No tenderness. No edema.     Left lower leg: No tenderness. No edema.  Skin:    General: Skin is warm and dry.     Capillary Refill: Capillary refill takes less than 2 seconds.  Neurological:     Mental Status: She is alert.  Psychiatric:        Mood and Affect: Mood normal.     ED Results / Procedures / Treatments   Labs (all labs ordered are listed, but only abnormal results are displayed) Labs Reviewed  CBC - Abnormal; Notable for the following  components:      Result Value   Hemoglobin 15.3 (*)    All other components within normal limits  LIPASE, BLOOD - Abnormal; Notable for the following components:   Lipase 56 (*)    All other components within normal limits  BASIC METABOLIC PANEL - Abnormal; Notable for the following components:   Glucose, Bld 213 (*)    All other components within normal limits  TROPONIN I (HIGH SENSITIVITY) - Abnormal; Notable for the following components:   Troponin I (High Sensitivity) 38 (*)    All other components within normal limits  TROPONIN I (HIGH SENSITIVITY) - Abnormal; Notable for the following components:   Troponin I (High Sensitivity) 174 (*)    All other components within normal limits  BRAIN NATRIURETIC PEPTIDE  HEPATIC  FUNCTION PANEL  D-DIMER, QUANTITATIVE (NOT AT [facility])  TSH  PROTIME-INR  HEMOGLOBIN A1C  LIPOPROTEIN A (LPA)  HEPARIN LEVEL (UNFRACTIONATED)    EKG EKG Interpretation Date/Time:  Monday March 21 2023 09:02:39 EDT Ventricular Rate:  74 PR Interval:  173 QRS Duration:  105 QT Interval:  440 QTC Calculation: 489 R Axis:   -15  Text Interpretation: Sinus rhythm Probable left ventricular hypertrophy Borderline prolonged QT interval when compared top rior, similar appearance. NO STEMI Confirmed by Theda Belfast ([id number]) on 03/21/2023 9:03:39 AM  Radiology CT CHEST ABDOMEN PELVIS W CONTRAST  Result Date: 03/21/2023 CLINICAL DATA:  Cough and fatigue with abnormal chest x-ray. EXAM: CT CHEST, ABDOMEN, AND PELVIS WITH CONTRAST TECHNIQUE: Multidetector CT imaging of the chest, abdomen and pelvis was performed following the standard protocol during bolus administration of intravenous contrast. RADIATION DOSE REDUCTION: This exam was performed according to the departmental dose-optimization program which includes automated exposure control, adjustment of the mA and/or kV according to patient size and/or use of iterative reconstruction technique. CONTRAST:  75mL OMNIPAQUE  IOHEXOL 350 MG/ML SOLN COMPARISON:  None Available. FINDINGS: CT CHEST FINDINGS Cardiovascular: No acute vascular findings. Normal heart size. No pericardial effusion. Atheromatous calcification of the aorta and coronaries which is extensive. Mediastinum/Nodes: No mass or adenopathy Lungs/Pleura: Mild linear scarring in the right middle lobe and lingula. There are small scattered pulmonary nodules measuring up to 5 mm in left lower lobe on 4:108. Additional dominant nodules highlighted on series 4 including in the left lower lobe on 4:83, right upper lobe on 4:69, and paramediastinal right lower lobe on 4:106. There is no edema, consolidation, effusion, or pneumothorax. Musculoskeletal: Multilevel bridging thoracic osteophytes. Advanced disc degeneration at T11-12. L3-4 and L4-5 high-grade degenerative spinal stenosis. CT ABDOMEN PELVIS FINDINGS Hepatobiliary: No focal liver abnormality.Cholecystectomy. No biliary dilatation. Pancreas: Unremarkable. Spleen: Eggshell calcification around the cystic density in the upper spleen measuring 4 cm. Adrenals/Urinary Tract: Negative adrenals. No hydronephrosis or stone. Unremarkable bladder. Stomach/Bowel: No obstruction. No appendicitis. Moderate stool throughout the colon Vascular/Lymphatic: Diffuse atheromatous calcification of the aorta. No mass or adenopathy. Reproductive:Hysterectomy. Other: No ascites or pneumoperitoneum. Musculoskeletal: No acute finding. Degenerative changes described above. IMPRESSION: 1. No acute finding.  Negative for pneumonia. 2. The left upper quadrant calcification on prior radiograph is a benign splenic pseudocyst. 3. Multiple pulmonary nodules measuring up to 5 mm. No follow-up needed if patient is low-risk (and has no known or suspected primary neoplasm). Non-contrast chest CT can be considered in 12 months if patient is high-risk. This recommendation follows the consensus statement: Guidelines for Management of Incidental Pulmonary Nodules  Detected on CT Images: From the Fleischner Society 2017; Radiology 2017; 284:228-243. 4. Extensive atherosclerosis including the coronary arteries. Electronically Signed   By: Tiburcio Pea M.D.   On: 03/21/2023 13:02   DG Chest 2 View  Result Date: 03/21/2023 CLINICAL DATA:  Provided history: Chest pain. EXAM: CHEST - 2 VIEW COMPARISON:  Prior chest radiographs 10/09/2021 and earlier. FINDINGS: Heart size within normal limits. Aortic atherosclerosis. No appreciable airspace consolidation or pulmonary edema. No evidence of pleural effusion or pneumothorax. No acute osseous abnormality identified. Degenerative changes of the spine. Indeterminate 4.2 cm ovoid, peripherally calcified lesion in the left upper quadrant of the abdomen. IMPRESSION: 1. No evidence of an acute cardiopulmonary abnormality. 2. Aortic Atherosclerosis (ICD10-I70.0). 3. Indeterminate 4.2 cm ovoid, peripherally calcified lesion in the left upper quadrant of the abdomen. A contrast-enhanced abdominal CT is recommended for further evaluation. Electronically Signed   By:  Jackey Loge D.O.   On: 03/21/2023 10:26    Procedures Procedures    CRITICAL CARE Performed by: Canary Brim Ayven Pheasant Total critical care time: 30 minutes Critical care time was exclusive of separately billable procedures and treating other patients. Critical care was necessary to treat or prevent imminent or life-threatening deterioration. Critical care was time spent personally by me on the following activities: development of treatment plan with patient and/or surrogate as well as nursing, discussions with consultants, evaluation of patient's response to treatment, examination of patient, obtaining history from patient or surrogate, ordering and performing treatments and interventions, ordering and review of laboratory studies, ordering and review of radiographic studies, pulse oximetry and re-evaluation of patient's condition.  Medications Ordered in  ED Medications  aspirin chewable tablet 81 mg (0 mg Oral Hold 03/21/23 1446)  0.9% sodium chloride infusion (0 mL/kg/hr  78 kg Intravenous Hold 03/21/23 1445)    Followed by  0.9% sodium chloride infusion (0 mL/kg/hr  78 kg Intravenous Hold 03/21/23 1446)  aspirin EC tablet 81 mg (has no administration in time range)  nitroGLYCERIN (NITROSTAT) SL tablet 0.4 mg (has no administration in time range)  acetaminophen (TYLENOL) tablet 650 mg (has no administration in time range)  ondansetron (ZOFRAN) injection 4 mg (has no administration in time range)  pravastatin (PRAVACHOL) tablet 40 mg (has no administration in time range)  levothyroxine (SYNTHROID) tablet 50 mcg (has no administration in time range)  metoprolol tartrate (LOPRESSOR) tablet 12.5 mg (12.5 mg Oral Given 03/21/23 1502)  insulin aspart (novoLOG) injection 0-6 Units (has no administration in time range)  heparin ADULT infusion 100 units/mL (25000 units/262mL) (900 Units/hr Intravenous New Bag/Given 03/21/23 1502)  alum & mag hydroxide-simeth (MAALOX/MYLANTA) 200-200-20 MG/5ML suspension 30 mL (30 mLs Oral Given 03/21/23 1024)    And  lidocaine (XYLOCAINE) 2 % viscous mouth solution 15 mL (15 mLs Oral Given 03/21/23 1024)  iohexol (OMNIPAQUE) 350 MG/ML injection 75 mL (75 mLs Intravenous Contrast Given 03/21/23 1251)  aspirin chewable tablet 324 mg (324 mg Oral Given 03/21/23 1406)    Or  aspirin suppository 300 mg ( Rectal See Alternative 03/21/23 1406)  heparin bolus via infusion 4,000 Units (4,000 Units Intravenous Bolus from Bag 03/21/23 1502)    ED Course/ Medical Decision Making/ A&P                             Medical Decision Making Amount and/or Complexity of Data Reviewed Labs: ordered. Radiology: ordered.  Risk OTC drugs. Prescription drug management. Decision regarding hospitalization.    Suzanne Sutton is a 77 y.o. female with a past medical history significant for hypothyroidism, diabetes, hypertension, and hyperlipidemia  who presents with chest tightness on and off for the last week or so.  She reports that last few days it has been worse and is normally bothering her more in the evenings.  She thinks it is more related to stress but today it was in the morning as well.  She describes as moderate tightness and pressure and also lead to some tingling in the arms.  Patient reports she thinks it is due to stress at her job but she is concerned about the discomfort this morning.  She does report she has had some edema in her legs recently that is new but is bilateral.  No history of DVT or PE.  No pleuritic symptoms, just the shortness of breath and tightness feeling.  She denies any acute  trauma.  She does report she has had cough recently that has been nonproductive.  Denies any fevers, chills, constipation, diarrhea, or urinary changes.  On exam, lungs are clear.  Chest nontender.  Abdomen nontender.  Good pulses in extremities.  Legs are nontender and nonedematous initially.  Patient resting comfortably.  EKG does not show STEMI.  Given the patient's description of chest tightness and shortness of breath, will get workup to look for concerning etiologies such as cardiac or pulmonary sources.  With her new edema she was describing, will get BNP.  Will get a D-dimer as she is not able for [facility] based on her age.  Will get cardiac enzymes.  Patient does state this feels slightly similar to when she had reflux troubles in the past and she did have hotdogs over the weekend.  Will give a GI cocktail while we wait for workup to be completed.  Anticipate reassessment after workup to determine disposition.  12:28 PM Patient's labs began to return.  Her initial troponin is slightly elevated at 38, will trend.  Second opponent returned at 174.  Cardiology was called who will come see patient in consultation.  She is still awaiting a CT scan to determine what this abnormal ovoid calcified structure is on x-ray.  A D-dimer was negative  so we will not do a CT PE study and instead just do a CT chest abdomen pelvis with contrast to further evaluate again in the setting of her chest pain and shortness of breath.  Anticipate admission given this rising troponin and earlier chest discomfort.  CT scan showed a pseudocyst on the spleen as the abnormality.  Anticipate admission after cardiology sees patient and workup is completed.  Cardiology will admit for further management for suspected NSTEMI with rising troponin.        Final Clinical Impression(s) / ED Diagnoses Final diagnoses:  Atypical chest pain  Elevated troponin     Clinical Impression: 1. Atypical chest pain   2. Elevated troponin     Disposition: Admit  This note was prepared with assistance of Dragon voice recognition software. Occasional wrong-word or sound-a-like substitutions may have occurred due to the inherent limitations of voice recognition software.      Tyjai Charbonnet, Canary Brim, MD 03/21/23 [phone]

## 2023-03-22 ENCOUNTER — Other Ambulatory Visit (HOSPITAL_COMMUNITY): Payer: Self-pay

## 2023-03-22 ENCOUNTER — Inpatient Hospital Stay (HOSPITAL_COMMUNITY)
Admission: EM | Disposition: A | Discharge status: Home / Self Care | Payer: Self-pay | Source: Home / Self Care | Attending: Cardiovascular Disease

## 2023-03-22 DIAGNOSIS — I2510 Atherosclerotic heart disease of native coronary artery without angina pectoris: Secondary | ICD-10-CM

## 2023-03-22 DIAGNOSIS — E785 Hyperlipidemia, unspecified: Secondary | ICD-10-CM | POA: Diagnosis not present

## 2023-03-22 DIAGNOSIS — I214 Non-ST elevation (NSTEMI) myocardial infarction: Secondary | ICD-10-CM | POA: Diagnosis not present

## 2023-03-22 DIAGNOSIS — I10 Essential (primary) hypertension: Secondary | ICD-10-CM

## 2023-03-22 DIAGNOSIS — I219 Acute myocardial infarction, unspecified: Secondary | ICD-10-CM

## 2023-03-22 HISTORY — PX: LEFT HEART CATH AND CORONARY ANGIOGRAPHY: CATH118249

## 2023-03-22 HISTORY — PX: CORONARY BALLOON ANGIOPLASTY: CATH118233

## 2023-03-22 HISTORY — PX: CORONARY PRESSURE/FFR STUDY: CATH118243

## 2023-03-22 HISTORY — DX: Acute myocardial infarction, unspecified: I21.9

## 2023-03-22 LAB — BASIC METABOLIC PANEL
Anion gap: 9 (ref 5–15)
BUN: 8 mg/dL (ref 8–23)
CO2: 28 mmol/L (ref 22–32)
Calcium: 9 mg/dL (ref 8.9–10.3)
Chloride: 97 mmol/L — ABNORMAL LOW (ref 98–111)
Creatinine, Ser: 0.76 mg/dL (ref 0.44–1.00)
GFR, Estimated: >60 mL/min (ref >60)
Glucose, Bld: 136 mg/dL — ABNORMAL HIGH (ref 70–99)
Potassium: 3.4 mmol/L — ABNORMAL LOW (ref 3.5–5.1)
Sodium: 134 mmol/L — ABNORMAL LOW (ref 135–145)

## 2023-03-22 LAB — LIPID PANEL
Cholesterol: 169 mg/dL (ref 0–200)
HDL: 59 mg/dL (ref >40)
LDL Cholesterol: 93 mg/dL (ref 0–99)
Total CHOL/HDL Ratio: 2.9 RATIO
Triglycerides: 87 mg/dL (ref <150)
VLDL: 17 mg/dL (ref 0–40)

## 2023-03-22 LAB — POCT ACTIVATED CLOTTING TIME
Activated Clotting Time: 299 seconds
Activated Clotting Time: 385 seconds

## 2023-03-22 LAB — GLUCOSE, CAPILLARY
Glucose-Capillary: 116 mg/dL — ABNORMAL HIGH (ref 70–99)
Glucose-Capillary: 131 mg/dL — ABNORMAL HIGH (ref 70–99)
Glucose-Capillary: 113 mg/dL — ABNORMAL HIGH (ref 70–99)
Glucose-Capillary: 157 mg/dL — ABNORMAL HIGH (ref 70–99)
Glucose-Capillary: 132 mg/dL — ABNORMAL HIGH (ref 70–99)

## 2023-03-22 LAB — CBC
HCT: 43.9 % (ref 36.0–46.0)
Hemoglobin: 14.7 g/dL (ref 12.0–15.0)
MCH: 29.9 pg (ref 26.0–34.0)
MCHC: 33.5 g/dL (ref 30.0–36.0)
MCV: 89.2 fL (ref 80.0–100.0)
Platelets: 219 10*3/uL (ref 150–400)
RBC: 4.92 MIL/uL (ref 3.87–5.11)
RDW: 12.7 % (ref 11.5–15.5)
WBC: 7 10*3/uL (ref 4.0–10.5)
nRBC: 0 % (ref 0.0–0.2)

## 2023-03-22 LAB — HEMOGLOBIN A1C
Hgb A1c MFr Bld: 7 % — ABNORMAL HIGH (ref 4.8–5.6)
Mean Plasma Glucose: 154 mg/dL

## 2023-03-22 LAB — HEPARIN LEVEL (UNFRACTIONATED): Heparin Unfractionated: 0.37 IU/mL (ref 0.30–0.70)

## 2023-03-22 SURGERY — LEFT HEART CATH AND CORONARY ANGIOGRAPHY
Anesthesia: LOCAL

## 2023-03-22 MED ORDER — TICAGRELOR 90 MG PO TABS
90.0000 mg | ORAL_TABLET | Freq: Two times a day (BID) | ORAL | Status: DC
  Administered 2023-03-22 – 2023-03-24 (×4): 90 mg via ORAL
  Filled 2023-03-22 (×4): qty 1

## 2023-03-22 MED ORDER — SODIUM CHLORIDE 0.9 % IV SOLN: 250.0000 mL | INTRAVENOUS | Status: DC | PRN

## 2023-03-22 MED ORDER — SODIUM CHLORIDE 0.9% FLUSH
3.0000 mL | Freq: Two times a day (BID) | INTRAVENOUS | Status: DC
  Administered 2023-03-23: 3 mL via INTRAVENOUS

## 2023-03-22 MED ORDER — SODIUM CHLORIDE 0.9% FLUSH: 3.0000 mL | Freq: Two times a day (BID) | INTRAVENOUS | Status: DC

## 2023-03-22 MED ORDER — TICAGRELOR 90 MG PO TABS
ORAL_TABLET | ORAL | Status: DC | PRN
  Administered 2023-03-22: 180 mg via ORAL

## 2023-03-22 MED ORDER — MIDAZOLAM HCL 2 MG/2ML IJ SOLN
INTRAMUSCULAR | Status: DC | PRN
  Administered 2023-03-22 (×2): 1 mg via INTRAVENOUS

## 2023-03-22 MED ORDER — HYDRALAZINE HCL 20 MG/ML IJ SOLN: 10.0000 mg | INTRAMUSCULAR | Status: AC | PRN

## 2023-03-22 MED ORDER — TICAGRELOR 90 MG PO TABS
ORAL_TABLET | ORAL | Status: AC
  Filled 2023-03-22: qty 1

## 2023-03-22 MED ORDER — VERAPAMIL HCL 2.5 MG/ML IV SOLN
INTRAVENOUS | Status: DC | PRN
  Administered 2023-03-22: 10 mL via INTRA_ARTERIAL

## 2023-03-22 MED ORDER — IOHEXOL 350 MG/ML SOLN
INTRAVENOUS | Status: DC | PRN
  Administered 2023-03-22: 95 mL

## 2023-03-22 MED ORDER — ATORVASTATIN CALCIUM 40 MG PO TABS
40.0000 mg | ORAL_TABLET | Freq: Every day | ORAL | Status: DC
  Administered 2023-03-22 – 2023-03-24 (×3): 40 mg via ORAL
  Filled 2023-03-22 (×2): qty 1

## 2023-03-22 MED ORDER — HEPARIN (PORCINE) 25000 UT/250ML-% IV SOLN
1050.0000 [IU]/h | INTRAVENOUS | Status: DC
  Administered 2023-03-22: 900 [IU]/h via INTRAVENOUS
  Filled 2023-03-22: qty 250

## 2023-03-22 MED ORDER — SODIUM CHLORIDE 0.9 % IV SOLN: INTRAVENOUS | Status: AC

## 2023-03-22 MED ORDER — IRBESARTAN 75 MG PO TABS
37.5000 mg | ORAL_TABLET | Freq: Every day | ORAL | Status: DC
  Administered 2023-03-23: 37.5 mg via ORAL
  Filled 2023-03-22: qty 1

## 2023-03-22 MED ORDER — HEPARIN SODIUM (PORCINE) 1000 UNIT/ML IJ SOLN
INTRAMUSCULAR | Status: DC | PRN
  Administered 2023-03-22: 5000 [IU] via INTRAVENOUS
  Administered 2023-03-22: 4000 [IU] via INTRAVENOUS

## 2023-03-22 MED ORDER — MIDAZOLAM HCL 2 MG/2ML IJ SOLN
INTRAMUSCULAR | Status: AC
  Filled 2023-03-22: qty 2

## 2023-03-22 MED ORDER — CLOPIDOGREL BISULFATE 75 MG PO TABS: 75.0000 mg | ORAL_TABLET | Freq: Every day | ORAL | Status: DC

## 2023-03-22 MED ORDER — SODIUM CHLORIDE 0.9% FLUSH: 3.0000 mL | INTRAVENOUS | Status: DC | PRN

## 2023-03-22 MED ORDER — POTASSIUM CHLORIDE CRYS ER 20 MEQ PO TBCR
40.0000 meq | EXTENDED_RELEASE_TABLET | Freq: Once | ORAL | Status: AC
  Administered 2023-03-22: 40 meq via ORAL
  Filled 2023-03-22: qty 2

## 2023-03-22 MED ORDER — FENTANYL CITRATE (PF) 100 MCG/2ML IJ SOLN
INTRAMUSCULAR | Status: DC | PRN
  Administered 2023-03-22 (×2): 25 ug via INTRAVENOUS

## 2023-03-22 MED ORDER — VERAPAMIL HCL 2.5 MG/ML IV SOLN
INTRAVENOUS | Status: AC
  Filled 2023-03-22: qty 2

## 2023-03-22 MED ORDER — LABETALOL HCL 5 MG/ML IV SOLN: 10.0000 mg | INTRAVENOUS | Status: AC | PRN

## 2023-03-22 MED ORDER — LIDOCAINE HCL (PF) 1 % IJ SOLN
INTRAMUSCULAR | Status: DC | PRN
  Administered 2023-03-22: 2 mL

## 2023-03-22 MED ORDER — FENTANYL CITRATE (PF) 100 MCG/2ML IJ SOLN
INTRAMUSCULAR | Status: AC
  Filled 2023-03-22: qty 2

## 2023-03-22 MED ORDER — LIDOCAINE HCL (PF) 1 % IJ SOLN
INTRAMUSCULAR | Status: AC
  Filled 2023-03-22: qty 30

## 2023-03-22 MED ORDER — HEPARIN SODIUM (PORCINE) 1000 UNIT/ML IJ SOLN
INTRAMUSCULAR | Status: AC
  Filled 2023-03-22: qty 10

## 2023-03-22 MED ORDER — NITROGLYCERIN 1 MG/10 ML FOR IR/CATH LAB
INTRA_ARTERIAL | Status: AC
  Filled 2023-03-22: qty 10

## 2023-03-22 MED ORDER — HEPARIN (PORCINE) IN NACL 1000-0.9 UT/500ML-% IV SOLN
INTRAVENOUS | Status: DC | PRN
  Administered 2023-03-22 (×2): 500 mL

## 2023-03-22 SURGICAL SUPPLY — 24 items
BALL SAPPHIRE NC24 3.0X12 (BALLOONS) ×1
BALLN EMERGE MR 2.0X12 (BALLOONS) ×1
BALLN SAPPHIRE 1.5X10 (BALLOONS) ×1
BALLOON EMERGE MR 2.0X12 (BALLOONS) IMPLANT
BALLOON SAPPHIRE 1.5X10 (BALLOONS) IMPLANT
BALLOON SAPPHIRE NC24 3.0X12 (BALLOONS) IMPLANT
CATH GUIDELINER COAST (CATHETERS) IMPLANT
CATH INFINITI 5 FR JL3.5 (CATHETERS) IMPLANT
CATH INFINITI JR4 5F (CATHETERS) IMPLANT
CATH LAUNCHER 6FR JR4 (CATHETERS) IMPLANT
CATH SHOCKWAVE C2 3.0X12 (CATHETERS) IMPLANT
CATH VISTA GUIDE 6FR XBLAD3.5 (CATHETERS) IMPLANT
DEVICE RAD COMP TR BAND LRG (VASCULAR PRODUCTS) IMPLANT
GLIDESHEATH SLEND SS 6F .021 (SHEATH) IMPLANT
GUIDEWIRE INQWIRE 1.5J.035X260 (WIRE) IMPLANT
GUIDEWIRE PRESSURE X 175 (WIRE) IMPLANT
INQWIRE 1.5J .035X260CM (WIRE) ×1
KIT ESSENTIALS PG (KITS) IMPLANT
KIT HEART LEFT (KITS) ×1 IMPLANT
PACK CARDIAC CATHETERIZATION (CUSTOM PROCEDURE TRAY) ×1 IMPLANT
SHEATH PROBE COVER 6X72 (BAG) IMPLANT
TRANSDUCER W/STOPCOCK (MISCELLANEOUS) ×1 IMPLANT
TUBING CIL FLEX 10 FLL-RA (TUBING) ×1 IMPLANT
WIRE ASAHI PROWATER 180CM (WIRE) IMPLANT

## 2023-03-22 NOTE — Progress Notes (Signed)
ANTICOAGULATION CONSULT NOTE - Follow Up  Pharmacy Consult for Heparin Indication: Chest pain/ACS  Allergies  Allergen Reactions   Cozaar [Losartan Potassium] Swelling    Had knee swelling   Crestor [Rosuvastatin] Swelling    Leg swelling    Patient Measurements: Height: 5\' 6"  (167.6 cm) Weight: 74.6 kg (164 lb 7.4 oz) IBW/kg (Calculated) : 59.3 Heparin Dosing Weight: 75.3 kg  Vital Signs: Temp: 98.2 F (36.8 C) (07/09 0443) Temp Source: Oral (07/09 0443) BP: 163/66 (07/09 1048) Pulse Rate: 64 (07/09 1048)  Labs: Recent Labs    03/21/23 0920 03/21/23 1105 03/21/23 1345 03/21/23 2314 03/22/23 0028  HGB 15.3*  --   --   --  14.7  HCT 45.7  --   --   --  43.9  PLT 219  --   --   --  219  LABPROT  --   --  13.7  --   --   INR  --   --  1.0  --   --   HEPARINUNFRC  --   --   --  0.40 0.37  CREATININE 0.85  --   --   --  0.76  TROPONINIHS 38* 174*  --   --   --      Estimated Creatinine Clearance: 60.8 mL/min (by C-G formula based on SCr of 0.76 mg/dL).   Medical History: Past Medical History:  Diagnosis Date   Uterine cancer (HCC)     Assessment: 42 YOF not anticoagulated PTA. Presented w/ chest pain associated w/ fatigue and SOB. CBC wnl, d-dimer neg, CXR w/ no evidence of acute cardiopulmonary abnormalities.   Pt s/p LHC with plans for staged intervention Thursday. Heparin to resume 6h after sheath removal.    Goal of Therapy:  Heparin level 0.3-0.7 units/ml   Plan:  Resume heparin 900 units/h no bolus at 1530 Recheck heparin level in am  Fredonia Highland, PharmD, [place], [facility] Clinical Pharmacist [phone] Please check AMION for all [facility] Pharmacy numbers 03/22/2023

## 2023-03-22 NOTE — Interval H&P Note (Signed)
History and Physical Interval Note:  03/22/2023 8:03 AM  Suzanne Sutton  has presented today for surgery, with the diagnosis of NSTEMI.  The various methods of treatment have been discussed with the patient and family. After consideration of risks, benefits and other options for treatment, the patient has consented to  Procedure(s): LEFT HEART CATH AND CORONARY ANGIOGRAPHY (N/A) as a surgical intervention.  The patient's history has been reviewed, patient examined, no change in status, stable for surgery.  I have reviewed the patient's chart and labs.  Questions were answered to the patient's satisfaction.    Cath Lab Visit (complete for each Cath Lab visit)  Clinical Evaluation Leading to the Procedure:   ACS: Yes.    Non-ACS:    Anginal Classification: CCS III  Anti-ischemic medical therapy: No Therapy  Non-Invasive Test Results: No non-invasive testing performed  Prior CABG: No previous CABG        Verne Carrow

## 2023-03-22 NOTE — Progress Notes (Signed)
ANTICOAGULATION CONSULT NOTE - Follow Up  Pharmacy Consult for Heparin Indication: Chest pain/ACS  Allergies  Allergen Reactions   Cozaar [Losartan Potassium] Swelling    Had knee swelling   Crestor [Rosuvastatin] Swelling    Leg swelling    Patient Measurements: Height: 5\' 6"  (167.6 cm) Weight: 74.6 kg (164 lb 7.4 oz) IBW/kg (Calculated) : 59.3 Heparin Dosing Weight: 75.3 kg  Vital Signs: Temp: 98.2 F (36.8 C) (07/08 2255) Temp Source: Oral (07/08 2255) BP: 157/67 (07/08 2255) Pulse Rate: 75 (07/08 2255)  Labs: Recent Labs    03/21/23 0920 03/21/23 1105 03/21/23 1345 03/21/23 2314  HGB 15.3*  --   --   --   HCT 45.7  --   --   --   PLT 219  --   --   --   LABPROT  --   --  13.7  --   INR  --   --  1.0  --   HEPARINUNFRC  --   --   --  0.40  CREATININE 0.85  --   --   --   TROPONINIHS 38* 174*  --   --      Estimated Creatinine Clearance: 57.2 mL/min (by C-G formula based on SCr of 0.85 mg/dL).   Medical History: Past Medical History:  Diagnosis Date   Uterine cancer (HCC)     Assessment: 68 YOF not anticoagulated PTA. Presented w/ chest pain associated w/ fatigue and SOB. CBC wnl, d-dimer neg, CXR w/ no evidence of acute cardiopulmonary abnormalities. Per RN, no signs/symptoms of bleeding or issues with the heparin drip running.  Goal of Therapy:  Heparin level 0.3-0.7 units/ml   Plan:  Continue heparin infusion at 900 units/hr Continue to monitor H&H and platelets Check heparin level every 8 hours Monitor for signs/symptoms of bleeding  Arabella Merles, PharmD. Clinical Pharmacist 03/22/2023 12:10 AM

## 2023-03-22 NOTE — Plan of Care (Signed)
Problem: Education: Goal: Understanding of CV disease, CV risk reduction, and recovery process will improve Outcome: Progressing Goal: Individualized Educational Video(s) Outcome: Progressing   Problem: Activity: Goal: Ability to return to baseline activity level will improve Outcome: Progressing   Problem: Cardiovascular: Goal: Ability to achieve and maintain adequate cardiovascular perfusion will improve Outcome: Progressing Goal: Vascular access site(s) Level 0-1 will be maintained Outcome: Progressing   Problem: Health Behavior/Discharge Planning: Goal: Ability to safely manage health-related needs after discharge will improve Outcome: Progressing   Problem: Education: Goal: Understanding of cardiac disease, CV risk reduction, and recovery process will improve Outcome: Progressing Goal: Individualized Educational Video(s) Outcome: Progressing   Problem: Activity: Goal: Ability to tolerate increased activity will improve Outcome: Progressing   Problem: Cardiac: Goal: Ability to achieve and maintain adequate cardiovascular perfusion will improve Outcome: Progressing   Problem: Health Behavior/Discharge Planning: Goal: Ability to safely manage health-related needs after discharge will improve Outcome: Progressing   Problem: Education: Goal: Ability to describe self-care measures that may prevent or decrease complications (Diabetes Survival Skills Education) will improve Outcome: Progressing Goal: Individualized Educational Video(s) Outcome: Progressing   Problem: Coping: Goal: Ability to adjust to condition or change in health will improve Outcome: Progressing   Problem: Fluid Volume: Goal: Ability to maintain a balanced intake and output will improve Outcome: Progressing   Problem: Health Behavior/Discharge Planning: Goal: Ability to identify and utilize available resources and services will improve Outcome: Progressing Goal: Ability to manage health-related  needs will improve Outcome: Progressing   Problem: Metabolic: Goal: Ability to maintain appropriate glucose levels will improve Outcome: Progressing   Problem: Nutritional: Goal: Maintenance of adequate nutrition will improve Outcome: Progressing Goal: Progress toward achieving an optimal weight will improve Outcome: Progressing   Problem: Skin Integrity: Goal: Risk for impaired skin integrity will decrease Outcome: Progressing   Problem: Tissue Perfusion: Goal: Adequacy of tissue perfusion will improve Outcome: Progressing   Problem: Education: Goal: Knowledge of General Education information will improve Description: Including pain rating scale, medication(s)/side effects and non-pharmacologic comfort measures Outcome: Progressing   Problem: Health Behavior/Discharge Planning: Goal: Ability to manage health-related needs will improve Outcome: Progressing   Problem: Clinical Measurements: Goal: Ability to maintain clinical measurements within normal limits will improve Outcome: Progressing Goal: Will remain free from infection Outcome: Progressing Goal: Diagnostic test results will improve Outcome: Progressing Goal: Respiratory complications will improve Outcome: Progressing Goal: Cardiovascular complication will be avoided Outcome: Progressing   Problem: Activity: Goal: Risk for activity intolerance will decrease Outcome: Progressing   Problem: Nutrition: Goal: Adequate nutrition will be maintained Outcome: Progressing   Problem: Coping: Goal: Level of anxiety will decrease Outcome: Progressing   Problem: Elimination: Goal: Will not experience complications related to bowel motility Outcome: Progressing Goal: Will not experience complications related to urinary retention Outcome: Progressing   Problem: Pain Managment: Goal: General experience of comfort will improve Outcome: Progressing   Problem: Safety: Goal: Ability to remain free from injury will  improve Outcome: Progressing   Problem: Skin Integrity: Goal: Risk for impaired skin integrity will decrease Outcome: Progressing   Problem: Education: Goal: Understanding of CV disease, CV risk reduction, and recovery process will improve Outcome: Progressing Goal: Individualized Educational Video(s) Outcome: Progressing   Problem: Activity: Goal: Ability to return to baseline activity level will improve Outcome: Progressing   Problem: Cardiovascular: Goal: Ability to achieve and maintain adequate cardiovascular perfusion will improve Outcome: Progressing Goal: Vascular access site(s) Level 0-1 will be maintained Outcome: Progressing   Problem: Health Behavior/Discharge Planning: Goal:   Ability to safely manage health-related needs after discharge will improve Outcome: Progressing   

## 2023-03-22 NOTE — TOC CM/SW Note (Signed)
Transition of Care [facility]) - Inpatient Brief Assessment   Patient Details  Name: MERRIE EPLER MRN: [id number] Date of Birth: [birth year]  Transition of Care [facility] CM/SW Contact:    Gala Lewandowsky, RN Phone Number: 03/22/2023, 3:30 PM   Clinical Narrative: Patient presented for chest pain-post LHC. Benefits check completed for Brilinta and cost is $47.00. Case Manager will continue to follow for additional transition of care needs as the patient progresses.   Transition of Care Asessment: Insurance and Status: Insurance coverage has been reviewed Patient has primary care physician: Yes Prior/Current Home Services: No current home services Social Determinants of Health Reivew: SDOH reviewed no interventions necessary Readmission risk has been reviewed: Yes Transition of care needs: no transition of care needs at this time

## 2023-03-22 NOTE — Progress Notes (Signed)
Notified by cath lab nurse that K 3.4. KCl ordered.

## 2023-03-22 NOTE — TOC Benefit Eligibility Note (Signed)
Pharmacy Patient Advocate Encounter  Insurance verification completed.    The patient is insured through [employer]/ [employer]   Ran test claim for Brilinta 90 mg and the current 30 day co-pay is $47.00.   This test claim was processed through [facility]- copay amounts may vary at other pharmacies due to pharmacy/plan contracts, or as the patient moves through the different stages of their insurance plan.    Angelize Ryce, CPHT Pharmacy Patient Advocate Specialist [facility] Patient Advocate Team Direct Number: [phone]  Fax: [phone] 

## 2023-03-22 NOTE — Progress Notes (Signed)
ANTICOAGULATION CONSULT NOTE - Follow Up  Pharmacy Consult for Heparin Indication: Chest pain/ACS  Allergies  Allergen Reactions   Cozaar [Losartan Potassium] Swelling    Had knee swelling   Crestor [Rosuvastatin] Swelling    Leg swelling    Patient Measurements: Height: 5\' 6"  (167.6 cm) Weight: 74.6 kg (164 lb 7.4 oz) IBW/kg (Calculated) : 59.3 Heparin Dosing Weight: 75.3 kg  Vital Signs: Temp: 98.2 F (36.8 C) (07/09 0443) Temp Source: Oral (07/09 0443) BP: 132/61 (07/09 0443) Pulse Rate: 60 (07/09 0443)  Labs: Recent Labs    03/21/23 0920 03/21/23 1105 03/21/23 1345 03/21/23 2314 03/22/23 0028  HGB 15.3*  --   --   --  14.7  HCT 45.7  --   --   --  43.9  PLT 219  --   --   --  219  LABPROT  --   --  13.7  --   --   INR  --   --  1.0  --   --   HEPARINUNFRC  --   --   --  0.40 0.37  CREATININE 0.85  --   --   --  0.76  TROPONINIHS 38* 174*  --   --   --      Estimated Creatinine Clearance: 60.8 mL/min (by C-G formula based on SCr of 0.76 mg/dL).   Medical History: Past Medical History:  Diagnosis Date   Uterine cancer (HCC)     Assessment: 17 YOF not anticoagulated PTA. Presented w/ chest pain associated w/ fatigue and SOB. CBC wnl, d-dimer neg, CXR w/ no evidence of acute cardiopulmonary abnormalities.   Plan for cath lab this AM (7/9). Heparin level before cath lab 0.37 therapeutic with heparin infusion at 900 units/h. CBC wnl and no signs or symptoms of bleeding reported.    Will f/u after cath to determine future anticoagulation plan.  Goal of Therapy:  Heparin level 0.3-0.7 units/ml   Plan:  Continue heparin infusion at 900 units/h Continue to monitor H&H and platelets Will f/u post cath for anticoagulation plans Monitor for signs/symptoms of bleeding  Stephenie Acres, PharmD PGY1 Pharmacy Resident 03/22/2023 9:33 AM

## 2023-03-22 NOTE — Progress Notes (Signed)
Rounding Note    Patient Name: Suzanne Sutton Date of Encounter: 03/22/2023  [facility] Cardiologist: Dr. Nanetta Batty  Subjective   Status post coronary angiography and attempted PCI.  Patient denies chest pain.  Inpatient Medications    Scheduled Meds:  [MAR Hold] aspirin EC  81 mg Oral Daily   [START ON 03/23/2023] clopidogrel  75 mg Oral Q breakfast   [MAR Hold] insulin aspart  0-6 Units Subcutaneous TID WC   [MAR Hold] levothyroxine  50 mcg Oral Q0600   [MAR Hold] metoprolol tartrate  12.5 mg Oral BID   nitroGLYCERIN       [MAR Hold] pravastatin  40 mg Oral Daily   sodium chloride flush  3 mL Intravenous Q12H   sodium chloride flush  3 mL Intravenous Q12H   Continuous Infusions:  sodium chloride     sodium chloride     sodium chloride 1 mL/kg/hr (03/22/23 0505)   heparin 900 Units/hr (03/21/23 1502)   PRN Meds: sodium chloride, sodium chloride, [MAR Hold] acetaminophen, fentaNYL, Heparin (Porcine) in NaCl, heparin sodium (porcine), iohexol, lidocaine (PF), midazolam, [MAR Hold] nitroGLYCERIN, nitroGLYCERIN, [MAR Hold] ondansetron (ZOFRAN) IV, Radial Cocktail/Verapamil only, sodium chloride flush, sodium chloride flush, ticagrelor   Vital Signs    Vitals:   03/22/23 0940 03/22/23 1000 03/22/23 1019 03/22/23 1048  BP:  (!) 166/75 (!) 156/63 (!) 163/66  Pulse: (!) 0 63 68 64  Resp:  (!) 22 (!) 27 (!) 21  Temp:      TempSrc:      SpO2:  99% 98% 98%  Weight:      Height:        Intake/Output Summary (Last 24 hours) at 03/22/2023 1050 Last data filed at 03/22/2023 0600 Gross per 24 hour  Intake 705.94 ml  Output 300 ml  Net 405.94 ml      03/22/2023    5:00 AM 03/21/2023   10:55 PM 03/21/2023    9:01 AM  Last 3 Weights  Weight (lbs) 164 lb 7.4 oz 164 lb 7.4 oz 172 lb  Weight (kg) 74.6 kg 74.6 kg 78.019 kg      Telemetry    Sinus rhythm- Personally Reviewed  ECG    Sinus rhythm at 63 with LVH voltage.- Personally Reviewed  Physical Exam    GEN: No acute distress.   Neck: No JVD Cardiac: RRR, no murmurs, rubs, or gallops.  Respiratory: Clear to auscultation bilaterally. GI: Soft, nontender, non-distended  MS: No edema; No deformity. Neuro:  Nonfocal  Psych: Normal affect   Labs    High Sensitivity Troponin:   Recent Labs  Lab 03/21/23 0920 03/21/23 1105  TROPONINIHS 38* 174*     Chemistry Recent Labs  Lab 03/21/23 0920 03/22/23 0028  NA 137 134*  K 3.8 3.4*  CL 100 97*  CO2 29 28  GLUCOSE 213* 136*  BUN 12 8  CREATININE 0.85 0.76  CALCIUM 9.5 9.0  PROT 6.6  --   ALBUMIN 4.0  --   AST 22  --   ALT 18  --   ALKPHOS 38  --   BILITOT 0.4  --   GFRNONAA >60 >60  ANIONGAP 8 9    Lipids  Recent Labs  Lab 03/22/23 0028  CHOL 169  TRIG 87  HDL 59  LDLCALC 93  CHOLHDL 2.9    Hematology Recent Labs  Lab 03/21/23 0920 03/22/23 0028  WBC 8.4 7.0  RBC 4.99 4.92  HGB 15.3* 14.7  HCT 45.7 43.9  MCV 91.6 89.2  MCH 30.7 29.9  MCHC 33.5 33.5  RDW 12.7 12.7  PLT 219 219   Thyroid  Recent Labs  Lab 03/21/23 1345  TSH 2.567    BNP Recent Labs  Lab 03/21/23 0920  BNP 20.2    DDimer  Recent Labs  Lab 03/21/23 1020  DDIMER 0.34     Radiology    CARDIAC CATHETERIZATION  Result Date: 03/22/2023   Prox RCA lesion is 40% stenosed.   Mid RCA lesion is 99% stenosed.   Ost Cx to Prox Cx lesion is 90% stenosed.   Mid LAD lesion is 60% stenosed.   Balloon angioplasty was performed using a BALLN EMERGE MR 2.0X12.   Post intervention, there is a 80% residual stenosis. Large dominant RCA with heavy calcification throughout the proximal, mid and distal vessel. Mild proximal stenosis. Severe, heavily calcified focal mid stenosis. The LAD has moderate mid vessel stenosis (RFR 0.96 suggesting this lesion is not flow limiting) Small non-dominant Circumflex with severe, heavily calcified proximal stenosis. This vessel is not favorable for PCI. Normal LV filling pressure Recommendations: Her LAD does not  have flow limiting disease and the Circumflex is not favorable for PCI (vessel size, ostial stenosis, tortuosity). I attempted to treat her RCA today but due to the heavy calcification, I was unable to deliver a lithotripsy balloon. I did perform balloon angioplasty which should provide improved flow down the RCA while we await the next procedure. She will need to have orbital atherectomy. There is no obvious vessel dissection at the balloon angioplasty site so we may be able to perform atherectomy later this week. I am in the cath lab on Thursday of this week and could do it then if we can get industry support. She has been loaded with Plavix.   ECHOCARDIOGRAM COMPLETE  Result Date: 03/21/2023    ECHOCARDIOGRAM REPORT   Patient Name:   Suzanne Sutton Date of Exam: 03/21/2023 Medical Rec #:  [id number]     Height:       66.0 in Accession #:    [id number]    Weight:       172.0 lb Date of Birth:  June 24, 1946    BSA:          1.876 m Patient Age:    76 years      BP:           166/82 mmHg Patient Gender: F             HR:           67 bpm. Exam Location:  Inpatient Procedure: 2D Echo, Cardiac Doppler and Color Doppler Indications:    NSTEMI  History:        Patient has no prior history of Echocardiogram examinations.                 Risk Factors:Diabetes and Hypertension.  Sonographer:    Melissa Morford RDCS (AE, PE) Referring Phys: [id number] Cyndi Bender IMPRESSIONS  1. Left ventricular ejection fraction, by estimation, is 55 to 60%. The left ventricle has normal function. The left ventricle has no regional wall motion abnormalities. Left ventricular diastolic parameters are consistent with Grade I diastolic dysfunction (impaired relaxation).  2. Right ventricular systolic function is normal. The right ventricular size is normal.  3. The mitral valve is normal in structure. No evidence of mitral valve regurgitation. No evidence of mitral stenosis.  4. The aortic valve is normal in structure.  Aortic valve regurgitation is  not visualized. No aortic stenosis is present.  5. The inferior vena cava is normal in size with greater than 50% respiratory variability, suggesting right atrial pressure of 3 mmHg. FINDINGS  Left Ventricle: Left ventricular ejection fraction, by estimation, is 55 to 60%. The left ventricle has normal function. The left ventricle has no regional wall motion abnormalities. The left ventricular internal cavity size was normal in size. There is  no left ventricular hypertrophy. Left ventricular diastolic parameters are consistent with Grade I diastolic dysfunction (impaired relaxation). Right Ventricle: The right ventricular size is normal. No increase in right ventricular wall thickness. Right ventricular systolic function is normal. Left Atrium: Left atrial size was normal in size. Right Atrium: Right atrial size was normal in size. Pericardium: There is no evidence of pericardial effusion. Mitral Valve: The mitral valve is normal in structure. Mild to moderate mitral annular calcification. No evidence of mitral valve regurgitation. No evidence of mitral valve stenosis. Tricuspid Valve: The tricuspid valve is normal in structure. Tricuspid valve regurgitation is not demonstrated. No evidence of tricuspid stenosis. Aortic Valve: The aortic valve is normal in structure. Aortic valve regurgitation is not visualized. No aortic stenosis is present. Pulmonic Valve: The pulmonic valve was not well visualized. Pulmonic valve regurgitation is not visualized. No evidence of pulmonic stenosis. Aorta: The aortic root is normal in size and structure. Venous: The inferior vena cava is normal in size with greater than 50% respiratory variability, suggesting right atrial pressure of 3 mmHg. IAS/Shunts: No atrial level shunt detected by color flow Doppler.  LEFT VENTRICLE PLAX 2D LVIDd:         3.80 cm     Diastology LVIDs:         2.90 cm     LV e' medial:    5.11 cm/s LV PW:         1.00 cm     LV E/e' medial:  10.1 LV IVS:         1.10 cm     LV e' lateral:   4.79 cm/s LVOT diam:     2.10 cm     LV E/e' lateral: 10.8 LV SV:         65 LV SV Index:   35 LVOT Area:     3.46 cm  LV Volumes (MOD) LV vol d, MOD A4C: 44.1 ml LV SV MOD A4C:     44.1 ml RIGHT VENTRICLE RV S prime:     9.36 cm/s TAPSE (M-mode): 2.3 cm LEFT ATRIUM             Index        RIGHT ATRIUM           Index LA diam:        3.30 cm 1.76 cm/m   RA Area:     13.40 cm LA Vol (A2C):   27.8 ml 14.82 ml/m  RA Volume:   31.90 ml  17.01 ml/m LA Vol (A4C):   34.0 ml 18.13 ml/m LA Biplane Vol: 33.5 ml 17.86 ml/m  AORTIC VALVE LVOT Vmax:   68.50 cm/s LVOT Vmean:  53.100 cm/s LVOT VTI:    0.189 m  AORTA Ao Root diam: 2.90 cm Ao Asc diam:  3.00 cm MITRAL VALVE MV Area (PHT): 3.56 cm     SHUNTS MV Decel Time: 213 msec     Systemic VTI:  0.19 m MV E velocity: 51.80 cm/s   Systemic Diam: 2.10 cm MV  A velocity: 103.00 cm/s MV E/A ratio:  0.50 Arvilla Meres MD Electronically signed by Arvilla Meres MD Signature Date/Time: 03/21/2023/5:35:42 PM    Final    CT CHEST ABDOMEN PELVIS W CONTRAST  Result Date: 03/21/2023 CLINICAL DATA:  Cough and fatigue with abnormal chest x-ray. EXAM: CT CHEST, ABDOMEN, AND PELVIS WITH CONTRAST TECHNIQUE: Multidetector CT imaging of the chest, abdomen and pelvis was performed following the standard protocol during bolus administration of intravenous contrast. RADIATION DOSE REDUCTION: This exam was performed according to the departmental dose-optimization program which includes automated exposure control, adjustment of the mA and/or kV according to patient size and/or use of iterative reconstruction technique. CONTRAST:  75mL OMNIPAQUE IOHEXOL 350 MG/ML SOLN COMPARISON:  None Available. FINDINGS: CT CHEST FINDINGS Cardiovascular: No acute vascular findings. Normal heart size. No pericardial effusion. Atheromatous calcification of the aorta and coronaries which is extensive. Mediastinum/Nodes: No mass or adenopathy Lungs/Pleura: Mild linear scarring in  the right middle lobe and lingula. There are small scattered pulmonary nodules measuring up to 5 mm in left lower lobe on 4:108. Additional dominant nodules highlighted on series 4 including in the left lower lobe on 4:83, right upper lobe on 4:69, and paramediastinal right lower lobe on 4:106. There is no edema, consolidation, effusion, or pneumothorax. Musculoskeletal: Multilevel bridging thoracic osteophytes. Advanced disc degeneration at T11-12. L3-4 and L4-5 high-grade degenerative spinal stenosis. CT ABDOMEN PELVIS FINDINGS Hepatobiliary: No focal liver abnormality.Cholecystectomy. No biliary dilatation. Pancreas: Unremarkable. Spleen: Eggshell calcification around the cystic density in the upper spleen measuring 4 cm. Adrenals/Urinary Tract: Negative adrenals. No hydronephrosis or stone. Unremarkable bladder. Stomach/Bowel: No obstruction. No appendicitis. Moderate stool throughout the colon Vascular/Lymphatic: Diffuse atheromatous calcification of the aorta. No mass or adenopathy. Reproductive:Hysterectomy. Other: No ascites or pneumoperitoneum. Musculoskeletal: No acute finding. Degenerative changes described above. IMPRESSION: 1. No acute finding.  Negative for pneumonia. 2. The left upper quadrant calcification on prior radiograph is a benign splenic pseudocyst. 3. Multiple pulmonary nodules measuring up to 5 mm. No follow-up needed if patient is low-risk (and has no known or suspected primary neoplasm). Non-contrast chest CT can be considered in 12 months if patient is high-risk. This recommendation follows the consensus statement: Guidelines for Management of Incidental Pulmonary Nodules Detected on CT Images: From the Fleischner Society 2017; Radiology 2017; 284:228-243. 4. Extensive atherosclerosis including the coronary arteries. Electronically Signed   By: Tiburcio Pea M.D.   On: 03/21/2023 13:02   DG Chest 2 View  Result Date: 03/21/2023 CLINICAL DATA:  Provided history: Chest pain. EXAM:  CHEST - 2 VIEW COMPARISON:  Prior chest radiographs 10/09/2021 and earlier. FINDINGS: Heart size within normal limits. Aortic atherosclerosis. No appreciable airspace consolidation or pulmonary edema. No evidence of pleural effusion or pneumothorax. No acute osseous abnormality identified. Degenerative changes of the spine. Indeterminate 4.2 cm ovoid, peripherally calcified lesion in the left upper quadrant of the abdomen. IMPRESSION: 1. No evidence of an acute cardiopulmonary abnormality. 2. Aortic Atherosclerosis (ICD10-I70.0). 3. Indeterminate 4.2 cm ovoid, peripherally calcified lesion in the left upper quadrant of the abdomen. A contrast-enhanced abdominal CT is recommended for further evaluation. Electronically Signed   By: Jackey Loge D.O.   On: 03/21/2023 10:26    Cardiac Studies   2D echo (03/21/2023)  IMPRESSIONS     1. Left ventricular ejection fraction, by estimation, is 55 to 60%. The  left ventricle has normal function. The left ventricle has no regional  wall motion abnormalities. Left ventricular diastolic parameters are  consistent with Grade I diastolic  dysfunction (impaired relaxation).   2. Right ventricular systolic function is normal. The right ventricular  size is normal.   3. The mitral valve is normal in structure. No evidence of mitral valve  regurgitation. No evidence of mitral stenosis.   4. The aortic valve is normal in structure. Aortic valve regurgitation is  not visualized. No aortic stenosis is present.   5. The inferior vena cava is normal in size with greater than 50%  respiratory variability, suggesting right atrial pressure of 3 mmHg.   Cardiac catheterization/PCI (03/22/2023)  Conclusion      Prox RCA lesion is 40% stenosed.   Mid RCA lesion is 99% stenosed.   Ost Cx to Prox Cx lesion is 90% stenosed.   Mid LAD lesion is 60% stenosed.   Balloon angioplasty was performed using a BALLN EMERGE MR 2.0X12.   Post intervention, there is a 80% residual  stenosis.   Large dominant RCA with heavy calcification throughout the proximal, mid and distal vessel. Mild proximal stenosis. Severe, heavily calcified focal mid stenosis.  The LAD has moderate mid vessel stenosis (RFR 0.96 suggesting this lesion is not flow limiting) Small non-dominant Circumflex with severe, heavily calcified proximal stenosis. This vessel is not favorable for PCI.  Normal LV filling pressure   Recommendations: Her LAD does not have flow limiting disease and the Circumflex is not favorable for PCI (vessel size, ostial stenosis, tortuosity). I attempted to treat her RCA today but due to the heavy calcification, I was unable to deliver a lithotripsy balloon. I did perform balloon angioplasty which should provide improved flow down the RCA while we await the next procedure. She will need to have orbital atherectomy. There is no obvious vessel dissection at the balloon angioplasty site so we may be able to perform atherectomy later this week. I am in the cath lab on Thursday of this week and could do it then if we can get industry support. She has been loaded with Plavix.   y Diagrams  Diagnostic Dominance: Right  Intervention      Patient Profile     Suzanne Sutton is a 77 y.o. female with a hx of HTN, HLD, hypothyroidism, type 2 DM,  who is being seen 03/21/2023 for the evaluation of chest pain and elevated troponin  at the request of Dr Rush Landmark.   Assessment & Plan    1: Non-STEMI-troponins rose to 170.  Cardiac cath revealed severely calcified coronary arteries.  She had a 99% focal calcified mid to distal dominant RCA stenosis which Dr. Clifton James attempted to intervene unsuccessfully because of the calcific nature of the disease.  Plan is for orbital atherectomy this coming Thursday.  I will place the patient back on IV heparin.  2: Essential hypertension-blood pressure still moderately elevated at 163/66 on low-dose metoprolol.  She was on benazepril/hydrochlorothiazide  at home as well.  Renal function is normal.  Will add a low-dose ARB.  3: Hyperlipidemia-LDL 93 on pravastatin.  Will change to atorvastatin 40 mg.  4: Hypothyroidism-TSH normal at 2.6.  Resume her Synthroid replacement.  For questions or updates, please contact [facility] Please consult [email or web address] for contact info under        Signed, Nanetta Batty, MD  03/22/2023, 10:50 AM

## 2023-03-23 ENCOUNTER — Ambulatory Visit (HOSPITAL_COMMUNITY): Admission: RE | Admit: 2023-03-23 | Payer: PPO | Source: Ambulatory Visit | Admitting: Cardiology

## 2023-03-23 ENCOUNTER — Inpatient Hospital Stay (HOSPITAL_COMMUNITY)
Admission: EM | Disposition: A | Discharge status: Home / Self Care | Payer: Self-pay | Source: Home / Self Care | Attending: Cardiovascular Disease

## 2023-03-23 ENCOUNTER — Encounter (HOSPITAL_COMMUNITY): Payer: Self-pay | Admitting: Cardiovascular Disease

## 2023-03-23 ENCOUNTER — Encounter (HOSPITAL_COMMUNITY)
Admission: EM | Disposition: A | Discharge status: Home / Self Care | Payer: Self-pay | Source: Home / Self Care | Attending: Cardiovascular Disease

## 2023-03-23 ENCOUNTER — Other Ambulatory Visit: Payer: Self-pay

## 2023-03-23 DIAGNOSIS — I10 Essential (primary) hypertension: Secondary | ICD-10-CM | POA: Diagnosis not present

## 2023-03-23 DIAGNOSIS — I2510 Atherosclerotic heart disease of native coronary artery without angina pectoris: Secondary | ICD-10-CM | POA: Diagnosis not present

## 2023-03-23 DIAGNOSIS — E785 Hyperlipidemia, unspecified: Secondary | ICD-10-CM | POA: Diagnosis not present

## 2023-03-23 DIAGNOSIS — I214 Non-ST elevation (NSTEMI) myocardial infarction: Secondary | ICD-10-CM | POA: Diagnosis not present

## 2023-03-23 HISTORY — PX: CORONARY ATHERECTOMY: CATH118238

## 2023-03-23 LAB — CBC
HCT: 42.6 % (ref 36.0–46.0)
HCT: 43.2 % (ref 36.0–46.0)
Hemoglobin: 14.7 g/dL (ref 12.0–15.0)
Hemoglobin: 14.7 g/dL (ref 12.0–15.0)
MCH: 30.9 pg (ref 26.0–34.0)
MCH: 29.9 pg (ref 26.0–34.0)
MCHC: 34.5 g/dL (ref 30.0–36.0)
MCHC: 34 g/dL (ref 30.0–36.0)
MCV: 89.7 fL (ref 80.0–100.0)
MCV: 88 fL (ref 80.0–100.0)
Platelets: 220 10*3/uL (ref 150–400)
Platelets: 226 10*3/uL (ref 150–400)
RBC: 4.75 MIL/uL (ref 3.87–5.11)
RBC: 4.91 MIL/uL (ref 3.87–5.11)
RDW: 12.9 % (ref 11.5–15.5)
RDW: 12.8 % (ref 11.5–15.5)
WBC: 7.8 10*3/uL (ref 4.0–10.5)
WBC: 8.2 10*3/uL (ref 4.0–10.5)
nRBC: 0 % (ref 0.0–0.2)
nRBC: 0 % (ref 0.0–0.2)

## 2023-03-23 LAB — BASIC METABOLIC PANEL WITH GFR
Anion gap: 8 (ref 5–15)
BUN: 11 mg/dL (ref 8–23)
CO2: 24 mmol/L (ref 22–32)
Calcium: 9 mg/dL (ref 8.9–10.3)
Chloride: 104 mmol/L (ref 98–111)
Creatinine, Ser: 0.65 mg/dL (ref 0.44–1.00)
GFR, Estimated: >60 mL/min
Glucose, Bld: 123 mg/dL — ABNORMAL HIGH (ref 70–99)
Potassium: 3.9 mmol/L (ref 3.5–5.1)
Sodium: 136 mmol/L (ref 135–145)

## 2023-03-23 LAB — GLUCOSE, CAPILLARY
Glucose-Capillary: 153 mg/dL — ABNORMAL HIGH (ref 70–99)
Glucose-Capillary: 125 mg/dL — ABNORMAL HIGH (ref 70–99)
Glucose-Capillary: 129 mg/dL — ABNORMAL HIGH (ref 70–99)
Glucose-Capillary: 224 mg/dL — ABNORMAL HIGH (ref 70–99)

## 2023-03-23 LAB — CREATININE, SERUM
Creatinine, Ser: 0.8 mg/dL (ref 0.44–1.00)
GFR, Estimated: >60 mL/min (ref >60)

## 2023-03-23 LAB — POCT ACTIVATED CLOTTING TIME
Activated Clotting Time: 483 seconds
Activated Clotting Time: 311 seconds

## 2023-03-23 LAB — LIPOPROTEIN A (LPA): Lipoprotein (a): 37.7 nmol/L — ABNORMAL HIGH (ref <75.0)

## 2023-03-23 LAB — HEPARIN LEVEL (UNFRACTIONATED): Heparin Unfractionated: 0.22 [IU]/mL — ABNORMAL LOW (ref 0.30–0.70)

## 2023-03-23 SURGERY — CORONARY ATHERECTOMY
Anesthesia: LOCAL

## 2023-03-23 SURGERY — LEFT HEART CATH AND CORONARY ANGIOGRAPHY
Anesthesia: LOCAL

## 2023-03-23 MED ORDER — NITROGLYCERIN 1 MG/10 ML FOR IR/CATH LAB
INTRA_ARTERIAL | Status: DC | PRN
  Administered 2023-03-23 (×2): 200 ug via INTRACORONARY

## 2023-03-23 MED ORDER — HEPARIN (PORCINE) IN NACL 1000-0.9 UT/500ML-% IV SOLN
INTRAVENOUS | Status: DC | PRN
  Administered 2023-03-23 (×2): 500 mL

## 2023-03-23 MED ORDER — VERAPAMIL HCL 2.5 MG/ML IV SOLN
INTRAVENOUS | Status: DC | PRN
  Administered 2023-03-23: 10 mL via INTRA_ARTERIAL

## 2023-03-23 MED ORDER — SODIUM CHLORIDE 0.9 % IV SOLN: 250.0000 mL | INTRAVENOUS | Status: DC | PRN

## 2023-03-23 MED ORDER — FENTANYL CITRATE (PF) 100 MCG/2ML IJ SOLN
INTRAMUSCULAR | Status: DC | PRN
  Administered 2023-03-23: 25 ug via INTRAVENOUS

## 2023-03-23 MED ORDER — SODIUM CHLORIDE 0.9% FLUSH
3.0000 mL | Freq: Two times a day (BID) | INTRAVENOUS | Status: DC
  Administered 2023-03-24: 3 mL via INTRAVENOUS

## 2023-03-23 MED ORDER — MIDAZOLAM HCL 2 MG/2ML IJ SOLN
INTRAMUSCULAR | Status: AC
  Filled 2023-03-23: qty 2

## 2023-03-23 MED ORDER — SODIUM CHLORIDE 0.9 % WEIGHT BASED INFUSION: 3.0000 mL/kg/h | INTRAVENOUS | Status: DC

## 2023-03-23 MED ORDER — SODIUM CHLORIDE 0.9 % IV SOLN
INTRAVENOUS | Status: AC | PRN
  Administered 2023-03-23: 368.5 mg via INTRAVENOUS

## 2023-03-23 MED ORDER — VERAPAMIL HCL 2.5 MG/ML IV SOLN
INTRAVENOUS | Status: AC
  Filled 2023-03-23: qty 2

## 2023-03-23 MED ORDER — LIDOCAINE HCL (PF) 1 % IJ SOLN
INTRAMUSCULAR | Status: AC
  Filled 2023-03-23: qty 30

## 2023-03-23 MED ORDER — SODIUM CHLORIDE 0.9% FLUSH: 3.0000 mL | INTRAVENOUS | Status: DC | PRN

## 2023-03-23 MED ORDER — SODIUM CHLORIDE 0.9 % IV SOLN
5.0000 mg/kg | Freq: Once | INTRAVENOUS | Status: DC
  Filled 2023-03-23: qty 14.74

## 2023-03-23 MED ORDER — HEPARIN SODIUM (PORCINE) 1000 UNIT/ML IJ SOLN
INTRAMUSCULAR | Status: AC
  Filled 2023-03-23: qty 10

## 2023-03-23 MED ORDER — LABETALOL HCL 5 MG/ML IV SOLN
INTRAVENOUS | Status: AC
  Filled 2023-03-23: qty 4

## 2023-03-23 MED ORDER — IOHEXOL 350 MG/ML SOLN
INTRAVENOUS | Status: DC | PRN
  Administered 2023-03-23: 100 mL

## 2023-03-23 MED ORDER — ASPIRIN 81 MG PO CHEW
81.0000 mg | CHEWABLE_TABLET | ORAL | Status: AC
  Administered 2023-03-23: 81 mg via ORAL
  Filled 2023-03-23: qty 1

## 2023-03-23 MED ORDER — FENTANYL CITRATE (PF) 100 MCG/2ML IJ SOLN
INTRAMUSCULAR | Status: AC
  Filled 2023-03-23: qty 2

## 2023-03-23 MED ORDER — SODIUM CHLORIDE 0.9 % WEIGHT BASED INFUSION: 1.0000 mL/kg/h | INTRAVENOUS | Status: AC

## 2023-03-23 MED ORDER — NITROGLYCERIN 1 MG/10 ML FOR IR/CATH LAB
INTRA_ARTERIAL | Status: AC
  Filled 2023-03-23: qty 10

## 2023-03-23 MED ORDER — LABETALOL HCL 5 MG/ML IV SOLN
INTRAVENOUS | Status: DC | PRN
  Administered 2023-03-23: 10 mg via INTRAVENOUS

## 2023-03-23 MED ORDER — IRBESARTAN 75 MG PO TABS
75.0000 mg | ORAL_TABLET | Freq: Every day | ORAL | Status: DC
  Administered 2023-03-24: 75 mg via ORAL
  Filled 2023-03-23: qty 1

## 2023-03-23 MED ORDER — SODIUM CHLORIDE 0.9% FLUSH
3.0000 mL | Freq: Two times a day (BID) | INTRAVENOUS | Status: DC
  Administered 2023-03-23: 3 mL via INTRAVENOUS

## 2023-03-23 MED ORDER — SODIUM CHLORIDE 0.9 % IV SOLN
INTRAVENOUS | Status: DC | PRN
  Administered 2023-03-23: 250 mL via INTRAVENOUS

## 2023-03-23 MED ORDER — SODIUM CHLORIDE 0.9 % WEIGHT BASED INFUSION
1.0000 mL/kg/h | INTRAVENOUS | Status: DC
  Administered 2023-03-23: 1 mL/kg/h via INTRAVENOUS

## 2023-03-23 MED ORDER — IRBESARTAN 75 MG PO TABS
37.5000 mg | ORAL_TABLET | Freq: Once | ORAL | Status: AC
  Administered 2023-03-23: 37.5 mg via ORAL
  Filled 2023-03-23: qty 1

## 2023-03-23 MED ORDER — HEPARIN SODIUM (PORCINE) 1000 UNIT/ML IJ SOLN
INTRAMUSCULAR | Status: DC | PRN
  Administered 2023-03-23: 8000 [IU] via INTRAVENOUS

## 2023-03-23 MED ORDER — MIDAZOLAM HCL 2 MG/2ML IJ SOLN
INTRAMUSCULAR | Status: DC | PRN
  Administered 2023-03-23: 1 mg via INTRAVENOUS

## 2023-03-23 MED ORDER — ENOXAPARIN SODIUM 40 MG/0.4ML IJ SOSY
40.0000 mg | PREFILLED_SYRINGE | INTRAMUSCULAR | Status: DC
  Administered 2023-03-24: 40 mg via SUBCUTANEOUS
  Filled 2023-03-23: qty 0.4

## 2023-03-23 MED FILL — Nitroglycerin IV Soln 100 MCG/ML in D5W: INTRA_ARTERIAL | Qty: 10 | Status: AC

## 2023-03-23 SURGICAL SUPPLY — 19 items
BALL SAPPHIRE NC24 3.25X15 (BALLOONS) ×1
BALLN SCOREFLEX 2.50X15 (BALLOONS) ×1
BALLOON SAPPHIRE NC24 3.25X15 (BALLOONS) IMPLANT
BALLOON SCOREFLEX 2.50X15 (BALLOONS) IMPLANT
CATH LAUNCHER 6FR AL.75 (CATHETERS) IMPLANT
CROWN DIAMONDBACK CLASSIC 1.25 (BURR) IMPLANT
DEVICE RAD COMP TR BAND LRG (VASCULAR PRODUCTS) IMPLANT
GLIDESHEATH SLEND SS 6F .021 (SHEATH) IMPLANT
GUIDEWIRE INQWIRE 1.5J.035X260 (WIRE) IMPLANT
INQWIRE 1.5J .035X260CM (WIRE) ×1
KIT ENCORE 26 ADVANTAGE (KITS) IMPLANT
KIT HEART LEFT (KITS) ×1 IMPLANT
LUBRICANT VIPERSLIDE CORONARY (MISCELLANEOUS) IMPLANT
PACK CARDIAC CATHETERIZATION (CUSTOM PROCEDURE TRAY) ×1 IMPLANT
STENT ONYX FRONTIER 3.0X26 (Permanent Stent) IMPLANT
TRANSDUCER W/STOPCOCK (MISCELLANEOUS) ×1 IMPLANT
TUBING CIL FLEX 10 FLL-RA (TUBING) ×1 IMPLANT
WIRE RUNTHROUGH .014X180CM (WIRE) IMPLANT
WIRE VIPERWIRE COR FLEX .012 (WIRE) IMPLANT

## 2023-03-23 NOTE — Interval H&P Note (Signed)
History and Physical Interval Note:  03/23/2023 3:59 PM  Suzanne Sutton  has presented today for surgery, with the diagnosis of cad.  The various methods of treatment have been discussed with the patient and family. After consideration of risks, benefits and other options for treatment, the patient has consented to  Procedure(s): CORONARY ATHERECTOMY (N/A) as a surgical intervention.  The patient's history has been reviewed, patient examined, no change in status, stable for surgery.  I have reviewed the patient's chart and labs.  Questions were answered to the patient's satisfaction.     Lorine Bears

## 2023-03-23 NOTE — Progress Notes (Signed)
ANTICOAGULATION CONSULT NOTE - Follow Up  Pharmacy Consult for Heparin Indication: Chest pain/ACS  Allergies  Allergen Reactions   Cozaar [Losartan Potassium] Swelling    Had knee swelling   Crestor [Rosuvastatin] Swelling    Leg swelling    Patient Measurements: Height: 5\' 6"  (167.6 cm) Weight: 74.6 kg (164 lb 7.4 oz) IBW/kg (Calculated) : 59.3 Heparin Dosing Weight: 75.3 kg  Vital Signs: Temp: 98.5 F (36.9 C) (07/09 2323) Temp Source: Oral (07/09 2323) BP: 146/66 (07/09 2323) Pulse Rate: 68 (07/09 2323)  Labs: Recent Labs    03/21/23 0920 03/21/23 1105 03/21/23 1345 03/21/23 2314 03/22/23 0028 03/23/23 0108  HGB 15.3*  --   --   --  14.7 14.7  HCT 45.7  --   --   --  43.9 42.6  PLT 219  --   --   --  219 220  LABPROT  --   --  13.7  --   --   --   INR  --   --  1.0  --   --   --   HEPARINUNFRC  --   --   --  0.40 0.37 0.22*  CREATININE 0.85  --   --   --  0.76 0.65  TROPONINIHS 38* 174*  --   --   --   --      Estimated Creatinine Clearance: 60.8 mL/min (by C-G formula based on SCr of 0.65 mg/dL).   Medical History: Past Medical History:  Diagnosis Date   Uterine cancer (HCC)     Assessment: 63 YOF not anticoagulated PTA. Presented w/ chest pain associated w/ fatigue and SOB. CBC wnl, d-dimer neg, CXR w/ no evidence of acute cardiopulmonary abnormalities.   Pt s/p LHC with plans for staged intervention Thursday. Heparin to resume 6h after sheath removal.   Heparin level returned at 0.22-subtherapeutic. Per RN no issues with the heparin drip getting drawn or pauses in the infusion. CBC stable   Goal of Therapy:  Heparin level 0.3-0.7 units/ml   Plan:  Increase heparin 1050 units/h  Recheck heparin level in 8 hours Monitor CBC and heparin level daily Monitor for signs/symptoms of bleeding  Arabella Merles, PharmD. Clinical Pharmacist 03/23/2023 2:12 AM

## 2023-03-23 NOTE — Plan of Care (Signed)
Problem: Education: Goal: Understanding of CV disease, CV risk reduction, and recovery process will improve Outcome: Progressing Goal: Individualized Educational Video(s) Outcome: Progressing   Problem: Activity: Goal: Ability to return to baseline activity level will improve Outcome: Progressing   Problem: Cardiovascular: Goal: Ability to achieve and maintain adequate cardiovascular perfusion will improve Outcome: Progressing Goal: Vascular access site(s) Level 0-1 will be maintained Outcome: Progressing   Problem: Health Behavior/Discharge Planning: Goal: Ability to safely manage health-related needs after discharge will improve Outcome: Progressing   Problem: Education: Goal: Understanding of cardiac disease, CV risk reduction, and recovery process will improve Outcome: Progressing Goal: Individualized Educational Video(s) Outcome: Progressing   Problem: Activity: Goal: Ability to tolerate increased activity will improve Outcome: Progressing   Problem: Cardiac: Goal: Ability to achieve and maintain adequate cardiovascular perfusion will improve Outcome: Progressing   Problem: Health Behavior/Discharge Planning: Goal: Ability to safely manage health-related needs after discharge will improve Outcome: Progressing   Problem: Education: Goal: Ability to describe self-care measures that may prevent or decrease complications (Diabetes Survival Skills Education) will improve Outcome: Progressing Goal: Individualized Educational Video(s) Outcome: Progressing   Problem: Coping: Goal: Ability to adjust to condition or change in health will improve Outcome: Progressing   Problem: Fluid Volume: Goal: Ability to maintain a balanced intake and output will improve Outcome: Progressing   Problem: Health Behavior/Discharge Planning: Goal: Ability to identify and utilize available resources and services will improve Outcome: Progressing Goal: Ability to manage health-related  needs will improve Outcome: Progressing   Problem: Metabolic: Goal: Ability to maintain appropriate glucose levels will improve Outcome: Progressing   Problem: Nutritional: Goal: Maintenance of adequate nutrition will improve Outcome: Progressing Goal: Progress toward achieving an optimal weight will improve Outcome: Progressing   Problem: Skin Integrity: Goal: Risk for impaired skin integrity will decrease Outcome: Progressing   Problem: Tissue Perfusion: Goal: Adequacy of tissue perfusion will improve Outcome: Progressing   Problem: Education: Goal: Knowledge of General Education information will improve Description: Including pain rating scale, medication(s)/side effects and non-pharmacologic comfort measures Outcome: Progressing   Problem: Health Behavior/Discharge Planning: Goal: Ability to manage health-related needs will improve Outcome: Progressing   Problem: Clinical Measurements: Goal: Ability to maintain clinical measurements within normal limits will improve Outcome: Progressing Goal: Will remain free from infection Outcome: Progressing Goal: Diagnostic test results will improve Outcome: Progressing Goal: Respiratory complications will improve Outcome: Progressing Goal: Cardiovascular complication will be avoided Outcome: Progressing   Problem: Activity: Goal: Risk for activity intolerance will decrease Outcome: Progressing   Problem: Nutrition: Goal: Adequate nutrition will be maintained Outcome: Progressing   Problem: Coping: Goal: Level of anxiety will decrease Outcome: Progressing   Problem: Elimination: Goal: Will not experience complications related to bowel motility Outcome: Progressing Goal: Will not experience complications related to urinary retention Outcome: Progressing   Problem: Pain Managment: Goal: General experience of comfort will improve Outcome: Progressing   Problem: Safety: Goal: Ability to remain free from injury will  improve Outcome: Progressing   Problem: Skin Integrity: Goal: Risk for impaired skin integrity will decrease Outcome: Progressing   Problem: Education: Goal: Understanding of CV disease, CV risk reduction, and recovery process will improve Outcome: Progressing Goal: Individualized Educational Video(s) Outcome: Progressing   Problem: Activity: Goal: Ability to return to baseline activity level will improve Outcome: Progressing   Problem: Cardiovascular: Goal: Ability to achieve and maintain adequate cardiovascular perfusion will improve Outcome: Progressing Goal: Vascular access site(s) Level 0-1 will be maintained Outcome: Progressing   Problem: Health Behavior/Discharge Planning: Goal:  Ability to safely manage health-related needs after discharge will improve Outcome: Progressing

## 2023-03-23 NOTE — Plan of Care (Signed)
  Problem: Education: Goal: Understanding of CV disease, CV risk reduction, and recovery process will improve Outcome: Progressing   Problem: Activity: Goal: Ability to return to baseline activity level will improve Outcome: Progressing   Problem: Cardiovascular: Goal: Ability to achieve and maintain adequate cardiovascular perfusion will improve Outcome: Progressing Goal: Vascular access site(s) Level 0-1 will be maintained Outcome: Progressing   Problem: Cardiovascular: Goal: Ability to achieve and maintain adequate cardiovascular perfusion will improve Outcome: Progressing

## 2023-03-23 NOTE — Progress Notes (Signed)
Pt declined ambulation before cath. Discussed with pt and daughter MI, possible stent and antiplatelet, restrictions, diet, exercise, NTG and CRPII. Pt receptive. Will refer to [place] CRPII.  [id number] Suzanne Sutton BS, ACSM-CEP 03/23/2023 11:14 AM

## 2023-03-23 NOTE — H&P (View-Only) (Signed)
Rounding Note    Patient Name: Suzanne Sutton Date of Encounter: 03/23/2023  [facility] Cardiologist: None   Subjective   Feeling ok, somewhat weak. No chest pain.  Inpatient Medications    Scheduled Meds:  aspirin EC  81 mg Oral Daily   atorvastatin  40 mg Oral Daily   insulin aspart  0-6 Units Subcutaneous TID WC   irbesartan  37.5 mg Oral Daily   levothyroxine  50 mcg Oral Q0600   metoprolol tartrate  12.5 mg Oral BID   sodium chloride flush  3 mL Intravenous Q12H   ticagrelor  90 mg Oral BID   Continuous Infusions:  sodium chloride     heparin 1,050 Units/hr (03/23/23 0321)   PRN Meds: sodium chloride, acetaminophen, nitroGLYCERIN, ondansetron (ZOFRAN) IV, sodium chloride flush   Vital Signs    Vitals:   03/22/23 2323 03/23/23 0327 03/23/23 0500 03/23/23 0818  BP: (!) 146/66 (!) 153/67  (!) 145/67  Pulse: 68 61    Resp: 20 18  18   Temp: 98.5 F (36.9 C) 98.4 F (36.9 C)  98.1 F (36.7 C)  TempSrc: Oral Oral  Oral  SpO2: 96% 95%    Weight:   73.7 kg   Height:        Intake/Output Summary (Last 24 hours) at 03/23/2023 0845 Last data filed at 03/23/2023 0325 Gross per 24 hour  Intake 1045.2 ml  Output --  Net 1045.2 ml      03/23/2023    5:00 AM 03/22/2023    5:00 AM 03/21/2023   10:55 PM  Last 3 Weights  Weight (lbs) 162 lb 7.7 oz 164 lb 7.4 oz 164 lb 7.4 oz  Weight (kg) 73.7 kg 74.6 kg 74.6 kg      Telemetry    Sinus Rhythm  - Personally Reviewed  ECG    No new tracing  Physical Exam   GEN: No acute distress.   Neck: No JVD Cardiac: RRR, no murmurs, rubs, or gallops.  Respiratory: Clear to auscultation bilaterally. GI: Soft, nontender, non-distended  MS: No edema; No deformity. Neuro:  Nonfocal  Psych: Normal affect   Labs    High Sensitivity Troponin:   Recent Labs  Lab 03/21/23 0920 03/21/23 1105  TROPONINIHS 38* 174*     Chemistry Recent Labs  Lab 03/21/23 0920 03/22/23 0028 03/23/23 0108  NA 137 134* 136   K 3.8 3.4* 3.9  CL 100 97* 104  CO2 29 28 24   GLUCOSE 213* 136* 123*  BUN 12 8 11   CREATININE 0.85 0.76 0.65  CALCIUM 9.5 9.0 9.0  PROT 6.6  --   --   ALBUMIN 4.0  --   --   AST 22  --   --   ALT 18  --   --   ALKPHOS 38  --   --   BILITOT 0.4  --   --   GFRNONAA >60 >60 >60  ANIONGAP 8 9 8     Lipids  Recent Labs  Lab 03/22/23 0028  CHOL 169  TRIG 87  HDL 59  LDLCALC 93  CHOLHDL 2.9    Hematology Recent Labs  Lab 03/21/23 0920 03/22/23 0028 03/23/23 0108  WBC 8.4 7.0 7.8  RBC 4.99 4.92 4.75  HGB 15.3* 14.7 14.7  HCT 45.7 43.9 42.6  MCV 91.6 89.2 89.7  MCH 30.7 29.9 30.9  MCHC 33.5 33.5 34.5  RDW 12.7 12.7 12.9  PLT 219 219 220   Thyroid  Recent Labs  Lab 03/21/23 1345  TSH 2.567    BNP Recent Labs  Lab 03/21/23 0920  BNP 20.2    DDimer  Recent Labs  Lab 03/21/23 1020  DDIMER 0.34     Radiology    CARDIAC CATHETERIZATION  Result Date: 03/22/2023   Prox RCA lesion is 40% stenosed.   Mid RCA lesion is 99% stenosed.   Ost Cx to Prox Cx lesion is 90% stenosed.   Mid LAD lesion is 60% stenosed.   Balloon angioplasty was performed using a BALLN EMERGE MR 2.0X12.   Post intervention, there is a 80% residual stenosis. Large dominant RCA with heavy calcification throughout the proximal, mid and distal vessel. Mild proximal stenosis. Severe, heavily calcified focal mid stenosis. The LAD has moderate mid vessel stenosis (RFR 0.96 suggesting this lesion is not flow limiting) Small non-dominant Circumflex with severe, heavily calcified proximal stenosis. This vessel is not favorable for PCI. Normal LV filling pressure Recommendations: Her LAD does not have flow limiting disease and the Circumflex is not favorable for PCI (vessel size, ostial stenosis, tortuosity). I attempted to treat her RCA today but due to the heavy calcification, I was unable to deliver a lithotripsy balloon. I did perform balloon angioplasty which should provide improved flow down the RCA  while we await the next procedure. She will need to have orbital atherectomy. There is no obvious vessel dissection at the balloon angioplasty site so we may be able to perform atherectomy later this week. I am in the cath lab on Thursday of this week and could do it then if we can get industry support. She has been loaded with Plavix.   ECHOCARDIOGRAM COMPLETE  Result Date: 03/21/2023    ECHOCARDIOGRAM REPORT   Patient Name:   Suzanne Sutton Date of Exam: 03/21/2023 Medical Rec #:  [id number]     Height:       66.0 in Accession #:    [id number]    Weight:       172.0 lb Date of Birth:  26-Apr-1946    BSA:          1.876 m Patient Age:    76 years      BP:           166/82 mmHg Patient Gender: F             HR:           67 bpm. Exam Location:  Inpatient Procedure: 2D Echo, Cardiac Doppler and Color Doppler Indications:    NSTEMI  History:        Patient has no prior history of Echocardiogram examinations.                 Risk Factors:Diabetes and Hypertension.  Sonographer:    Melissa Morford RDCS (AE, PE) Referring Phys: [id number] Cyndi Bender IMPRESSIONS  1. Left ventricular ejection fraction, by estimation, is 55 to 60%. The left ventricle has normal function. The left ventricle has no regional wall motion abnormalities. Left ventricular diastolic parameters are consistent with Grade I diastolic dysfunction (impaired relaxation).  2. Right ventricular systolic function is normal. The right ventricular size is normal.  3. The mitral valve is normal in structure. No evidence of mitral valve regurgitation. No evidence of mitral stenosis.  4. The aortic valve is normal in structure. Aortic valve regurgitation is not visualized. No aortic stenosis is present.  5. The inferior vena cava is normal in size with greater than 50% respiratory variability,  suggesting right atrial pressure of 3 mmHg. FINDINGS  Left Ventricle: Left ventricular ejection fraction, by estimation, is 55 to 60%. The left ventricle has normal function.  The left ventricle has no regional wall motion abnormalities. The left ventricular internal cavity size was normal in size. There is  no left ventricular hypertrophy. Left ventricular diastolic parameters are consistent with Grade I diastolic dysfunction (impaired relaxation). Right Ventricle: The right ventricular size is normal. No increase in right ventricular wall thickness. Right ventricular systolic function is normal. Left Atrium: Left atrial size was normal in size. Right Atrium: Right atrial size was normal in size. Pericardium: There is no evidence of pericardial effusion. Mitral Valve: The mitral valve is normal in structure. Mild to moderate mitral annular calcification. No evidence of mitral valve regurgitation. No evidence of mitral valve stenosis. Tricuspid Valve: The tricuspid valve is normal in structure. Tricuspid valve regurgitation is not demonstrated. No evidence of tricuspid stenosis. Aortic Valve: The aortic valve is normal in structure. Aortic valve regurgitation is not visualized. No aortic stenosis is present. Pulmonic Valve: The pulmonic valve was not well visualized. Pulmonic valve regurgitation is not visualized. No evidence of pulmonic stenosis. Aorta: The aortic root is normal in size and structure. Venous: The inferior vena cava is normal in size with greater than 50% respiratory variability, suggesting right atrial pressure of 3 mmHg. IAS/Shunts: No atrial level shunt detected by color flow Doppler.  LEFT VENTRICLE PLAX 2D LVIDd:         3.80 cm     Diastology LVIDs:         2.90 cm     LV e' medial:    5.11 cm/s LV PW:         1.00 cm     LV E/e' medial:  10.1 LV IVS:        1.10 cm     LV e' lateral:   4.79 cm/s LVOT diam:     2.10 cm     LV E/e' lateral: 10.8 LV SV:         65 LV SV Index:   35 LVOT Area:     3.46 cm  LV Volumes (MOD) LV vol d, MOD A4C: 44.1 ml LV SV MOD A4C:     44.1 ml RIGHT VENTRICLE RV S prime:     9.36 cm/s TAPSE (M-mode): 2.3 cm LEFT ATRIUM              Index        RIGHT ATRIUM           Index LA diam:        3.30 cm 1.76 cm/m   RA Area:     13.40 cm LA Vol (A2C):   27.8 ml 14.82 ml/m  RA Volume:   31.90 ml  17.01 ml/m LA Vol (A4C):   34.0 ml 18.13 ml/m LA Biplane Vol: 33.5 ml 17.86 ml/m  AORTIC VALVE LVOT Vmax:   68.50 cm/s LVOT Vmean:  53.100 cm/s LVOT VTI:    0.189 m  AORTA Ao Root diam: 2.90 cm Ao Asc diam:  3.00 cm MITRAL VALVE MV Area (PHT): 3.56 cm     SHUNTS MV Decel Time: 213 msec     Systemic VTI:  0.19 m MV E velocity: 51.80 cm/s   Systemic Diam: 2.10 cm MV A velocity: 103.00 cm/s MV E/A ratio:  0.50 Arvilla Meres MD Electronically signed by Arvilla Meres MD Signature Date/Time: 03/21/2023/5:35:42 PM    Final  CT CHEST ABDOMEN PELVIS W CONTRAST  Result Date: 03/21/2023 CLINICAL DATA:  Cough and fatigue with abnormal chest x-ray. EXAM: CT CHEST, ABDOMEN, AND PELVIS WITH CONTRAST TECHNIQUE: Multidetector CT imaging of the chest, abdomen and pelvis was performed following the standard protocol during bolus administration of intravenous contrast. RADIATION DOSE REDUCTION: This exam was performed according to the departmental dose-optimization program which includes automated exposure control, adjustment of the mA and/or kV according to patient size and/or use of iterative reconstruction technique. CONTRAST:  75mL OMNIPAQUE IOHEXOL 350 MG/ML SOLN COMPARISON:  None Available. FINDINGS: CT CHEST FINDINGS Cardiovascular: No acute vascular findings. Normal heart size. No pericardial effusion. Atheromatous calcification of the aorta and coronaries which is extensive. Mediastinum/Nodes: No mass or adenopathy Lungs/Pleura: Mild linear scarring in the right middle lobe and lingula. There are small scattered pulmonary nodules measuring up to 5 mm in left lower lobe on 4:108. Additional dominant nodules highlighted on series 4 including in the left lower lobe on 4:83, right upper lobe on 4:69, and paramediastinal right lower lobe on 4:106. There is no  edema, consolidation, effusion, or pneumothorax. Musculoskeletal: Multilevel bridging thoracic osteophytes. Advanced disc degeneration at T11-12. L3-4 and L4-5 high-grade degenerative spinal stenosis. CT ABDOMEN PELVIS FINDINGS Hepatobiliary: No focal liver abnormality.Cholecystectomy. No biliary dilatation. Pancreas: Unremarkable. Spleen: Eggshell calcification around the cystic density in the upper spleen measuring 4 cm. Adrenals/Urinary Tract: Negative adrenals. No hydronephrosis or stone. Unremarkable bladder. Stomach/Bowel: No obstruction. No appendicitis. Moderate stool throughout the colon Vascular/Lymphatic: Diffuse atheromatous calcification of the aorta. No mass or adenopathy. Reproductive:Hysterectomy. Other: No ascites or pneumoperitoneum. Musculoskeletal: No acute finding. Degenerative changes described above. IMPRESSION: 1. No acute finding.  Negative for pneumonia. 2. The left upper quadrant calcification on prior radiograph is a benign splenic pseudocyst. 3. Multiple pulmonary nodules measuring up to 5 mm. No follow-up needed if patient is low-risk (and has no known or suspected primary neoplasm). Non-contrast chest CT can be considered in 12 months if patient is high-risk. This recommendation follows the consensus statement: Guidelines for Management of Incidental Pulmonary Nodules Detected on CT Images: From the Fleischner Society 2017; Radiology 2017; 284:228-243. 4. Extensive atherosclerosis including the coronary arteries. Electronically Signed   By: Tiburcio Pea M.D.   On: 03/21/2023 13:02   DG Chest 2 View  Result Date: 03/21/2023 CLINICAL DATA:  Provided history: Chest pain. EXAM: CHEST - 2 VIEW COMPARISON:  Prior chest radiographs 10/09/2021 and earlier. FINDINGS: Heart size within normal limits. Aortic atherosclerosis. No appreciable airspace consolidation or pulmonary edema. No evidence of pleural effusion or pneumothorax. No acute osseous abnormality identified. Degenerative changes  of the spine. Indeterminate 4.2 cm ovoid, peripherally calcified lesion in the left upper quadrant of the abdomen. IMPRESSION: 1. No evidence of an acute cardiopulmonary abnormality. 2. Aortic Atherosclerosis (ICD10-I70.0). 3. Indeterminate 4.2 cm ovoid, peripherally calcified lesion in the left upper quadrant of the abdomen. A contrast-enhanced abdominal CT is recommended for further evaluation. Electronically Signed   By: Jackey Loge D.O.   On: 03/21/2023 10:26    Cardiac Studies   Cath: 03/22/2023    Prox RCA lesion is 40% stenosed.   Mid RCA lesion is 99% stenosed.   Ost Cx to Prox Cx lesion is 90% stenosed.   Mid LAD lesion is 60% stenosed.   Balloon angioplasty was performed using a BALLN EMERGE MR 2.0X12.   Post intervention, there is a 80% residual stenosis.   Large dominant RCA with heavy calcification throughout the proximal, mid and distal vessel. Mild proximal  stenosis. Severe, heavily calcified focal mid stenosis.  The LAD has moderate mid vessel stenosis (RFR 0.96 suggesting this lesion is not flow limiting) Small non-dominant Circumflex with severe, heavily calcified proximal stenosis. This vessel is not favorable for PCI.  Normal LV filling pressure   Recommendations: Her LAD does not have flow limiting disease and the Circumflex is not favorable for PCI (vessel size, ostial stenosis, tortuosity). I attempted to treat her RCA today but due to the heavy calcification, I was unable to deliver a lithotripsy balloon. I did perform balloon angioplasty which should provide improved flow down the RCA while we await the next procedure. She will need to have orbital atherectomy. There is no obvious vessel dissection at the balloon angioplasty site so we may be able to perform atherectomy later this week. I am in the cath lab on Thursday of this week and could do it then if we can get industry support. She has been loaded with Plavix.    Diagnostic Dominance:  Right  Intervention    Patient Profile     77 y.o. female with a hx of HTN, HLD, hypothyroidism, type 2 DM, who is being seen 03/21/2023 for the evaluation of chest pain and elevated troponin at the request of Dr Rush Landmark.   Assessment & Plan    NSTEMI -- hsTn peaked at 174, underwent cardiac cath yesterday with large dominant RCA with heavy calcification in the p/m/d vessel with severe, heavily calcified focal mid stenosis in the mLAD (RFR 0.96). Attempted PCI of the RCA but unable to treat with lithotripsy. Initially planned for Friday, but able to accommodate today.  -- continue IV heparin, ASA, Brilinta, statin, metoprolol   HTN -- blood pressures elevated -- continue metoprolol 12.5mg  BID, increase irbesartan to 75mg  daily   HLD -- LDL 93, HDL 59 -- continue atorvastatin 40mg  daily  Hypothyroidism -- continue synthroid 50 mcg daily  DM -- Hgb A1c 7.0 -- SSI  For questions or updates, please contact [facility] Please consult [email or web address] for contact info under    Signed, Laverda Page, NP  03/23/2023, 8:45 AM     Agree with note by Laverda Page NP-C  Patient had coronary angiography yesterday by Dr. Clifton James revealing a 99% calcified mid to distal dominant RCA stenosis as well as a 60 to 70% proximal LAD stenosis.  FFR interrogation of the LAD revealed this to be not physiologically significant.  Dr. Clifton James attempted to perform shockwave angioplasty on the RCA and was able to pass a 1.5 and 2 mm balloon successfully but was unable to pass the shockwave balloon.  The consensus was she would be best served with orbital atherectomy followed by PCI and stenting.  This was originally scheduled for Friday but Dr. Kirke Corin has agreed to perform this later this afternoon.  She has been maintained on IV heparin has been asymptomatic.  She currently is on DAPT.  Runell Gess, M.D., FACP, [facility], [facility] [address]  [address] [place], [place]  [address]  [phone] 03/23/2023 10:18 AM

## 2023-03-23 NOTE — Progress Notes (Addendum)
 Rounding Note    Patient Name: Suzanne Sutton Date of Encounter: 03/23/2023  [facility] Cardiologist: None   Subjective   Feeling ok, somewhat weak. No chest pain.  Inpatient Medications    Scheduled Meds:  aspirin EC  81 mg Oral Daily   atorvastatin  40 mg Oral Daily   insulin aspart  0-6 Units Subcutaneous TID WC   irbesartan  37.5 mg Oral Daily   levothyroxine  50 mcg Oral Q0600   metoprolol tartrate  12.5 mg Oral BID   sodium chloride flush  3 mL Intravenous Q12H   ticagrelor  90 mg Oral BID   Continuous Infusions:  sodium chloride     heparin 1,050 Units/hr (03/23/23 0321)   PRN Meds: sodium chloride, acetaminophen, nitroGLYCERIN, ondansetron (ZOFRAN) IV, sodium chloride flush   Vital Signs    Vitals:   03/22/23 2323 03/23/23 0327 03/23/23 0500 03/23/23 0818  BP: (!) 146/66 (!) 153/67  (!) 145/67  Pulse: 68 61    Resp: 20 18  18  Temp: 98.5 F (36.9 C) 98.4 F (36.9 C)  98.1 F (36.7 C)  TempSrc: Oral Oral  Oral  SpO2: 96% 95%    Weight:   73.7 kg   Height:        Intake/Output Summary (Last 24 hours) at 03/23/2023 0845 Last data filed at 03/23/2023 0325 Gross per 24 hour  Intake 1045.2 ml  Output --  Net 1045.2 ml      03/23/2023    5:00 AM 03/22/2023    5:00 AM 03/21/2023   10:55 PM  Last 3 Weights  Weight (lbs) 162 lb 7.7 oz 164 lb 7.4 oz 164 lb 7.4 oz  Weight (kg) 73.7 kg 74.6 kg 74.6 kg      Telemetry    Sinus Rhythm  - Personally Reviewed  ECG    No new tracing  Physical Exam   GEN: No acute distress.   Neck: No JVD Cardiac: RRR, no murmurs, rubs, or gallops.  Respiratory: Clear to auscultation bilaterally. GI: Soft, nontender, non-distended  MS: No edema; No deformity. Neuro:  Nonfocal  Psych: Normal affect   Labs    High Sensitivity Troponin:   Recent Labs  Lab 03/21/23 0920 03/21/23 1105  TROPONINIHS 38* 174*     Chemistry Recent Labs  Lab 03/21/23 0920 03/22/23 0028 03/23/23 0108  NA 137 134* 136   K 3.8 3.4* 3.9  CL 100 97* 104  CO2 29 28 24  GLUCOSE 213* 136* 123*  BUN 12 8 11  CREATININE 0.85 0.76 0.65  CALCIUM 9.5 9.0 9.0  PROT 6.6  --   --   ALBUMIN 4.0  --   --   AST 22  --   --   ALT 18  --   --   ALKPHOS 38  --   --   BILITOT 0.4  --   --   GFRNONAA >60 >60 >60  ANIONGAP 8 9 8    Lipids  Recent Labs  Lab 03/22/23 0028  CHOL 169  TRIG 87  HDL 59  LDLCALC 93  CHOLHDL 2.9    Hematology Recent Labs  Lab 03/21/23 0920 03/22/23 0028 03/23/23 0108  WBC 8.4 7.0 7.8  RBC 4.99 4.92 4.75  HGB 15.3* 14.7 14.7  HCT 45.7 43.9 42.6  MCV 91.6 89.2 89.7  MCH 30.7 29.9 30.9  MCHC 33.5 33.5 34.5  RDW 12.7 12.7 12.9  PLT 219 219 220   Thyroid    Recent Labs  Lab 03/21/23 1345  TSH 2.567    BNP Recent Labs  Lab 03/21/23 0920  BNP 20.2    DDimer  Recent Labs  Lab 03/21/23 1020  DDIMER 0.34     Radiology    CARDIAC CATHETERIZATION  Result Date: 03/22/2023   Prox RCA lesion is 40% stenosed.   Mid RCA lesion is 99% stenosed.   Ost Cx to Prox Cx lesion is 90% stenosed.   Mid LAD lesion is 60% stenosed.   Balloon angioplasty was performed using a BALLN EMERGE MR 2.0X12.   Post intervention, there is a 80% residual stenosis. Large dominant RCA with heavy calcification throughout the proximal, mid and distal vessel. Mild proximal stenosis. Severe, heavily calcified focal mid stenosis. The LAD has moderate mid vessel stenosis (RFR 0.96 suggesting this lesion is not flow limiting) Small non-dominant Circumflex with severe, heavily calcified proximal stenosis. This vessel is not favorable for PCI. Normal LV filling pressure Recommendations: Her LAD does not have flow limiting disease and the Circumflex is not favorable for PCI (vessel size, ostial stenosis, tortuosity). I attempted to treat her RCA today but due to the heavy calcification, I was unable to deliver a lithotripsy balloon. I did perform balloon angioplasty which should provide improved flow down the RCA  while we await the next procedure. She will need to have orbital atherectomy. There is no obvious vessel dissection at the balloon angioplasty site so we may be able to perform atherectomy later this week. I am in the cath lab on Thursday of this week and could do it then if we can get industry support. She has been loaded with Plavix.   ECHOCARDIOGRAM COMPLETE  Result Date: 03/21/2023    ECHOCARDIOGRAM REPORT   Patient Name:   [name] Date of Exam: 03/21/2023 Medical Rec #:  [id number]     Height:       66.0 in Accession #:    [id number]    Weight:       172.0 lb Date of Birth:  04/28/1946    BSA:          1.876 m Patient Age:    76 years      BP:           166/82 mmHg Patient Gender: F             HR:           67 bpm. Exam Location:  Inpatient Procedure: 2D Echo, Cardiac Doppler and Color Doppler Indications:    NSTEMI  History:        Patient has no prior history of Echocardiogram examinations.                 Risk Factors:Diabetes and Hypertension.  Sonographer:    Melissa Morford RDCS (AE, PE) Referring Phys: [id number] XIKA ZHAO IMPRESSIONS  1. Left ventricular ejection fraction, by estimation, is 55 to 60%. The left ventricle has normal function. The left ventricle has no regional wall motion abnormalities. Left ventricular diastolic parameters are consistent with Grade I diastolic dysfunction (impaired relaxation).  2. Right ventricular systolic function is normal. The right ventricular size is normal.  3. The mitral valve is normal in structure. No evidence of mitral valve regurgitation. No evidence of mitral stenosis.  4. The aortic valve is normal in structure. Aortic valve regurgitation is not visualized. No aortic stenosis is present.  5. The inferior vena cava is normal in size with greater than 50% respiratory variability,   suggesting right atrial pressure of 3 mmHg. FINDINGS  Left Ventricle: Left ventricular ejection fraction, by estimation, is 55 to 60%. The left ventricle has normal function.  The left ventricle has no regional wall motion abnormalities. The left ventricular internal cavity size was normal in size. There is  no left ventricular hypertrophy. Left ventricular diastolic parameters are consistent with Grade I diastolic dysfunction (impaired relaxation). Right Ventricle: The right ventricular size is normal. No increase in right ventricular wall thickness. Right ventricular systolic function is normal. Left Atrium: Left atrial size was normal in size. Right Atrium: Right atrial size was normal in size. Pericardium: There is no evidence of pericardial effusion. Mitral Valve: The mitral valve is normal in structure. Mild to moderate mitral annular calcification. No evidence of mitral valve regurgitation. No evidence of mitral valve stenosis. Tricuspid Valve: The tricuspid valve is normal in structure. Tricuspid valve regurgitation is not demonstrated. No evidence of tricuspid stenosis. Aortic Valve: The aortic valve is normal in structure. Aortic valve regurgitation is not visualized. No aortic stenosis is present. Pulmonic Valve: The pulmonic valve was not well visualized. Pulmonic valve regurgitation is not visualized. No evidence of pulmonic stenosis. Aorta: The aortic root is normal in size and structure. Venous: The inferior vena cava is normal in size with greater than 50% respiratory variability, suggesting right atrial pressure of 3 mmHg. IAS/Shunts: No atrial level shunt detected by color flow Doppler.  LEFT VENTRICLE PLAX 2D LVIDd:         3.80 cm     Diastology LVIDs:         2.90 cm     LV e' medial:    5.11 cm/s LV PW:         1.00 cm     LV E/e' medial:  10.1 LV IVS:        1.10 cm     LV e' lateral:   4.79 cm/s LVOT diam:     2.10 cm     LV E/e' lateral: 10.8 LV SV:         65 LV SV Index:   35 LVOT Area:     3.46 cm  LV Volumes (MOD) LV vol d, MOD A4C: 44.1 ml LV SV MOD A4C:     44.1 ml RIGHT VENTRICLE RV S prime:     9.36 cm/s TAPSE (M-mode): 2.3 cm LEFT ATRIUM              Index        RIGHT ATRIUM           Index LA diam:        3.30 cm 1.76 cm/m   RA Area:     13.40 cm LA Vol (A2C):   27.8 ml 14.82 ml/m  RA Volume:   31.90 ml  17.01 ml/m LA Vol (A4C):   34.0 ml 18.13 ml/m LA Biplane Vol: 33.5 ml 17.86 ml/m  AORTIC VALVE LVOT Vmax:   68.50 cm/s LVOT Vmean:  53.100 cm/s LVOT VTI:    0.189 m  AORTA Ao Root diam: 2.90 cm Ao Asc diam:  3.00 cm MITRAL VALVE MV Area (PHT): 3.56 cm     SHUNTS MV Decel Time: 213 msec     Systemic VTI:  0.19 m MV E velocity: 51.80 cm/s   Systemic Diam: 2.10 cm MV A velocity: 103.00 cm/s MV E/A ratio:  0.50 Daniel Bensimhon MD Electronically signed by Daniel Bensimhon MD Signature Date/Time: 03/21/2023/5:35:42 PM    Final      CT CHEST ABDOMEN PELVIS W CONTRAST  Result Date: 03/21/2023 CLINICAL DATA:  Cough and fatigue with abnormal chest x-ray. EXAM: CT CHEST, ABDOMEN, AND PELVIS WITH CONTRAST TECHNIQUE: Multidetector CT imaging of the chest, abdomen and pelvis was performed following the standard protocol during bolus administration of intravenous contrast. RADIATION DOSE REDUCTION: This exam was performed according to the departmental dose-optimization program which includes automated exposure control, adjustment of the mA and/or kV according to patient size and/or use of iterative reconstruction technique. CONTRAST:  75mL OMNIPAQUE IOHEXOL 350 MG/ML SOLN COMPARISON:  None Available. FINDINGS: CT CHEST FINDINGS Cardiovascular: No acute vascular findings. Normal heart size. No pericardial effusion. Atheromatous calcification of the aorta and coronaries which is extensive. Mediastinum/Nodes: No mass or adenopathy Lungs/Pleura: Mild linear scarring in the right middle lobe and lingula. There are small scattered pulmonary nodules measuring up to 5 mm in left lower lobe on 4:108. Additional dominant nodules highlighted on series 4 including in the left lower lobe on 4:83, right upper lobe on 4:69, and paramediastinal right lower lobe on 4:106. There is no  edema, consolidation, effusion, or pneumothorax. Musculoskeletal: Multilevel bridging thoracic osteophytes. Advanced disc degeneration at T11-12. L3-4 and L4-5 high-grade degenerative spinal stenosis. CT ABDOMEN PELVIS FINDINGS Hepatobiliary: No focal liver abnormality.Cholecystectomy. No biliary dilatation. Pancreas: Unremarkable. Spleen: Eggshell calcification around the cystic density in the upper spleen measuring 4 cm. Adrenals/Urinary Tract: Negative adrenals. No hydronephrosis or stone. Unremarkable bladder. Stomach/Bowel: No obstruction. No appendicitis. Moderate stool throughout the colon Vascular/Lymphatic: Diffuse atheromatous calcification of the aorta. No mass or adenopathy. Reproductive:Hysterectomy. Other: No ascites or pneumoperitoneum. Musculoskeletal: No acute finding. Degenerative changes described above. IMPRESSION: 1. No acute finding.  Negative for pneumonia. 2. The left upper quadrant calcification on prior radiograph is a benign splenic pseudocyst. 3. Multiple pulmonary nodules measuring up to 5 mm. No follow-up needed if patient is low-risk (and has no known or suspected primary neoplasm). Non-contrast chest CT can be considered in 12 months if patient is high-risk. This recommendation follows the consensus statement: Guidelines for Management of Incidental Pulmonary Nodules Detected on CT Images: From the Fleischner Society 2017; Radiology 2017; 284:228-243. 4. Extensive atherosclerosis including the coronary arteries. Electronically Signed   By: Kambre Messner  Watts M.D.   On: 03/21/2023 13:02   DG Chest 2 View  Result Date: 03/21/2023 CLINICAL DATA:  Provided history: Chest pain. EXAM: CHEST - 2 VIEW COMPARISON:  Prior chest radiographs 10/09/2021 and earlier. FINDINGS: Heart size within normal limits. Aortic atherosclerosis. No appreciable airspace consolidation or pulmonary edema. No evidence of pleural effusion or pneumothorax. No acute osseous abnormality identified. Degenerative changes  of the spine. Indeterminate 4.2 cm ovoid, peripherally calcified lesion in the left upper quadrant of the abdomen. IMPRESSION: 1. No evidence of an acute cardiopulmonary abnormality. 2. Aortic Atherosclerosis (ICD10-I70.0). 3. Indeterminate 4.2 cm ovoid, peripherally calcified lesion in the left upper quadrant of the abdomen. A contrast-enhanced abdominal CT is recommended for further evaluation. Electronically Signed   By: Kyle  Golden D.O.   On: 03/21/2023 10:26    Cardiac Studies   Cath: 03/22/2023    Prox RCA lesion is 40% stenosed.   Mid RCA lesion is 99% stenosed.   Ost Cx to Prox Cx lesion is 90% stenosed.   Mid LAD lesion is 60% stenosed.   Balloon angioplasty was performed using a BALLN EMERGE MR 2.0X12.   Post intervention, there is a 80% residual stenosis.   Large dominant RCA with heavy calcification throughout the proximal, mid and distal vessel. Mild proximal   stenosis. Severe, heavily calcified focal mid stenosis.  The LAD has moderate mid vessel stenosis (RFR 0.96 suggesting this lesion is not flow limiting) Small non-dominant Circumflex with severe, heavily calcified proximal stenosis. This vessel is not favorable for PCI.  Normal LV filling pressure   Recommendations: Her LAD does not have flow limiting disease and the Circumflex is not favorable for PCI (vessel size, ostial stenosis, tortuosity). I attempted to treat her RCA today but due to the heavy calcification, I was unable to deliver a lithotripsy balloon. I did perform balloon angioplasty which should provide improved flow down the RCA while we await the next procedure. She will need to have orbital atherectomy. There is no obvious vessel dissection at the balloon angioplasty site so we may be able to perform atherectomy later this week. I am in the cath lab on Thursday of this week and could do it then if we can get industry support. She has been loaded with Plavix.    Diagnostic Dominance:  Right  Intervention    Patient Profile     76 y.o. female with a hx of HTN, HLD, hypothyroidism, type 2 DM, who is being seen 03/21/2023 for the evaluation of chest pain and elevated troponin at the request of Dr Tegeler.   Assessment & Plan    NSTEMI -- hsTn peaked at 174, underwent cardiac cath yesterday with large dominant RCA with heavy calcification in the p/m/d vessel with severe, heavily calcified focal mid stenosis in the mLAD (RFR 0.96). Attempted PCI of the RCA but unable to treat with lithotripsy. Initially planned for Friday, but able to accommodate today.  -- continue IV heparin, ASA, Brilinta, statin, metoprolol   HTN -- blood pressures elevated -- continue metoprolol 12.5mg BID, increase irbesartan to 75mg daily   HLD -- LDL 93, HDL 59 -- continue atorvastatin 40mg daily  Hypothyroidism -- continue synthroid 50 mcg daily  DM -- Hgb A1c 7.0 -- SSI  For questions or updates, please contact [facility] Please consult [email or web address] for contact info under    Signed, Lindsay Roberts, NP  03/23/2023, 8:45 AM     Agree with note by Lindsay Roberts NP-C  Patient had coronary angiography yesterday by Dr. Mcalhany revealing a 99% calcified mid to distal dominant RCA stenosis as well as a 60 to 70% proximal LAD stenosis.  FFR interrogation of the LAD revealed this to be not physiologically significant.  Dr. Mcalhany attempted to perform shockwave angioplasty on the RCA and was able to pass a 1.5 and 2 mm balloon successfully but was unable to pass the shockwave balloon.  The consensus was she would be best served with orbital atherectomy followed by PCI and stenting.  This was originally scheduled for Friday but Dr. Arida has agreed to perform this later this afternoon.  She has been maintained on IV heparin has been asymptomatic.  She currently is on DAPT.  Gladys Deckard J. Ahyan Kreeger, M.D., FACP, FACC, FAHA, FSCAI [facility] [address]  [address] [place], [place]  [address]  [phone] 03/23/2023 10:18 AM  

## 2023-03-24 ENCOUNTER — Telehealth: Payer: Self-pay | Admitting: Cardiology

## 2023-03-24 ENCOUNTER — Encounter (HOSPITAL_COMMUNITY): Payer: Self-pay | Admitting: Cardiovascular Disease

## 2023-03-24 ENCOUNTER — Other Ambulatory Visit (HOSPITAL_COMMUNITY): Payer: Self-pay

## 2023-03-24 DIAGNOSIS — I214 Non-ST elevation (NSTEMI) myocardial infarction: Secondary | ICD-10-CM | POA: Diagnosis not present

## 2023-03-24 DIAGNOSIS — Z006 Encounter for examination for normal comparison and control in clinical research program: Secondary | ICD-10-CM

## 2023-03-24 LAB — CBC
HCT: 42.8 % (ref 36.0–46.0)
Hemoglobin: 14.6 g/dL (ref 12.0–15.0)
MCH: 30.4 pg (ref 26.0–34.0)
MCHC: 34.1 g/dL (ref 30.0–36.0)
MCV: 89.2 fL (ref 80.0–100.0)
Platelets: 243 10*3/uL (ref 150–400)
RBC: 4.8 MIL/uL (ref 3.87–5.11)
RDW: 13 % (ref 11.5–15.5)
WBC: 9.9 10*3/uL (ref 4.0–10.5)
nRBC: 0 % (ref 0.0–0.2)

## 2023-03-24 LAB — BASIC METABOLIC PANEL
Anion gap: 9 (ref 5–15)
BUN: 14 mg/dL (ref 8–23)
CO2: 21 mmol/L — ABNORMAL LOW (ref 22–32)
Calcium: 8.9 mg/dL (ref 8.9–10.3)
Chloride: 105 mmol/L (ref 98–111)
Creatinine, Ser: 0.95 mg/dL (ref 0.44–1.00)
GFR, Estimated: >60 mL/min (ref >60)
Glucose, Bld: 212 mg/dL — ABNORMAL HIGH (ref 70–99)
Potassium: 3.7 mmol/L (ref 3.5–5.1)
Sodium: 135 mmol/L (ref 135–145)

## 2023-03-24 LAB — GLUCOSE, CAPILLARY: Glucose-Capillary: 171 mg/dL — ABNORMAL HIGH (ref 70–99)

## 2023-03-24 MED ORDER — METOPROLOL SUCCINATE ER 25 MG PO TB24
25.0000 mg | ORAL_TABLET | Freq: Every day | ORAL | 3 refills | Status: DC
  Filled 2023-03-24 – 2023-04-20 (×2): qty 30, 30d supply, fill #0
  Filled 2023-05-20: qty 30, 30d supply, fill #1
  Filled 2023-06-17: qty 30, 30d supply, fill #2

## 2023-03-24 MED ORDER — NITROGLYCERIN 0.4 MG SL SUBL
0.4000 mg | SUBLINGUAL_TABLET | SUBLINGUAL | 0 refills | Status: DC | PRN
  Filled 2023-03-24: qty 25, 7d supply, fill #0

## 2023-03-24 MED ORDER — METOPROLOL SUCCINATE ER 25 MG PO TB24
25.0000 mg | ORAL_TABLET | Freq: Every day | ORAL | Status: DC
  Administered 2023-03-24: 25 mg via ORAL
  Filled 2023-03-24: qty 1

## 2023-03-24 MED ORDER — VIPERSLIDE LUBRICANT OPTIME: TOPICAL | Status: DC | PRN

## 2023-03-24 MED ORDER — TICAGRELOR 90 MG PO TABS
90.0000 mg | ORAL_TABLET | Freq: Two times a day (BID) | ORAL | 3 refills | Status: DC
Start: 2023-03-24 — End: 2023-06-29
  Filled 2023-03-24 – 2023-04-20 (×2): qty 60, 30d supply, fill #0
  Filled 2023-05-20: qty 60, 30d supply, fill #1
  Filled 2023-06-17: qty 60, 30d supply, fill #2

## 2023-03-24 MED ORDER — ATORVASTATIN CALCIUM 40 MG PO TABS
40.0000 mg | ORAL_TABLET | Freq: Every day | ORAL | 3 refills | Status: DC
  Filled 2023-03-24 – 2023-04-20 (×2): qty 30, 30d supply, fill #0
  Filled 2023-05-20: qty 30, 30d supply, fill #1
  Filled 2023-06-17: qty 30, 30d supply, fill #2

## 2023-03-24 MED ORDER — ASPIRIN 81 MG PO TBEC
81.0000 mg | DELAYED_RELEASE_TABLET | Freq: Every day | ORAL | 3 refills | Status: AC
  Filled 2023-03-24: qty 120, 120d supply, fill #0

## 2023-03-24 MED FILL — Lidocaine HCl Local Preservative Free (PF) Inj 1%: INTRAMUSCULAR | Qty: 30 | Status: AC

## 2023-03-24 NOTE — Telephone Encounter (Signed)
Patient currently in hospital.  Will need to call back tomorrow.

## 2023-03-24 NOTE — Progress Notes (Signed)
Patient seen just after discharge visit by Dr. Allyson Sabal.  I reviewed with Dr. Allyson Sabal and Kirke Corin if she would be a candidate for [facility].  I then discussed with the patient and her daughter in detail.  Chart and interventional data reviewed in detail.  The patient has DM but no evidence of ocular bleeding.  She had uterine bleeding in 2001, but had hysterectomy for cancer with no recurrence.  No history of GI bleeding.  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  No chest pain.   She has been seen by the care team for discharge.   Alert, oriented female in NAD No JVD or carotid bruits.  Lungs clear to A and P.  Cor regular Radial site (2 procedures with ecchymosis without hematoma but no masses.  Slightly tender.   Abdomen - soft and without hepatosplenomegaly Extremity without edema.   ECG  July 11 NSR LVH.  Delay R wave progression.  No acute changes.   The clinical trial process was explained in detail to the patient, including objectives, possible benefits and risks involved, and nature of randomization.    Arturo Morton. Riley Kill, MD, [facility] Chair, [facility]

## 2023-03-24 NOTE — Discharge Summary (Addendum)
Discharge Summary    Patient ID: Suzanne Sutton MRN: [id number]; DOB: November 02, 1945  Admit date: 03/21/2023 Discharge date: 03/24/2023  PCP:  Moshe Cipro, NP   [facility] Providers Cardiologist:  None        Discharge Diagnoses    Principal Problem:   NSTEMI (non-ST elevated myocardial infarction) [facility])    Diagnostic Studies/Procedures    03/23/23 Coronary Atherectomy    Prox RCA lesion is 40% stenosed.   RPDA lesion is 50% stenosed.   Mid RCA lesion is 90% stenosed.   A drug-eluting stent was successfully placed using a STENT ONYX FRONTIER 3.0X26.   Post intervention, there is a 0% residual stenosis.   In the absence of any other complications or medical issues, we expect the patient to be ready for discharge from an interventional cardiology perspective on 03/24/2023.   Successful orbital atherectomy and drug-eluting stent placement to the mid right coronary artery.   Recommendations: Dual antiplatelet therapy for at least 12 months. Aggressive treatment of risk factors.  Diagnostic Dominance: Right  Intervention     03/22/23 LHC    Prox RCA lesion is 40% stenosed.   Mid RCA lesion is 99% stenosed.   Ost Cx to Prox Cx lesion is 90% stenosed.   Mid LAD lesion is 60% stenosed.   Balloon angioplasty was performed using a BALLN EMERGE MR 2.0X12.   Post intervention, there is a 80% residual stenosis.   Large dominant RCA with heavy calcification throughout the proximal, mid and distal vessel. Mild proximal stenosis. Severe, heavily calcified focal mid stenosis.  The LAD has moderate mid vessel stenosis (RFR 0.96 suggesting this lesion is not flow limiting) Small non-dominant Circumflex with severe, heavily calcified proximal stenosis. This vessel is not favorable for PCI.  Normal LV filling pressure   Recommendations: Her LAD does not have flow limiting disease and the Circumflex is not favorable for PCI (vessel size, ostial stenosis,  tortuosity). I attempted to treat her RCA today but due to the heavy calcification, I was unable to deliver a lithotripsy balloon. I did perform balloon angioplasty which should provide improved flow down the RCA while we await the next procedure. She will need to have orbital atherectomy.   Diagnostic Dominance: Right  Intervention    03/21/23 TTE  IMPRESSIONS     1. Left ventricular ejection fraction, by estimation, is 55 to 60%. The  left ventricle has normal function. The left ventricle has no regional  wall motion abnormalities. Left ventricular diastolic parameters are  consistent with Grade I diastolic  dysfunction (impaired relaxation).   2. Right ventricular systolic function is normal. The right ventricular  size is normal.   3. The mitral valve is normal in structure. No evidence of mitral valve  regurgitation. No evidence of mitral stenosis.   4. The aortic valve is normal in structure. Aortic valve regurgitation is  not visualized. No aortic stenosis is present.   5. The inferior vena cava is normal in size with greater than 50%  respiratory variability, suggesting right atrial pressure of 3 mmHg.   FINDINGS   Left Ventricle: Left ventricular ejection fraction, by estimation, is 55  to 60%. The left ventricle has normal function. The left ventricle has no  regional wall motion abnormalities. The left ventricular internal cavity  size was normal in size. There is   no left ventricular hypertrophy. Left ventricular diastolic parameters  are consistent with Grade I diastolic dysfunction (impaired relaxation).   Right Ventricle: The right  ventricular size is normal. No increase in  right ventricular wall thickness. Right ventricular systolic function is  normal.   Left Atrium: Left atrial size was normal in size.   Right Atrium: Right atrial size was normal in size.   Pericardium: There is no evidence of pericardial effusion.   Mitral Valve: The mitral valve is  normal in structure. Mild to moderate  mitral annular calcification. No evidence of mitral valve regurgitation.  No evidence of mitral valve stenosis.   Tricuspid Valve: The tricuspid valve is normal in structure. Tricuspid  valve regurgitation is not demonstrated. No evidence of tricuspid  stenosis.   Aortic Valve: The aortic valve is normal in structure. Aortic valve  regurgitation is not visualized. No aortic stenosis is present.   Pulmonic Valve: The pulmonic valve was not well visualized. Pulmonic valve  regurgitation is not visualized. No evidence of pulmonic stenosis.   Aorta: The aortic root is normal in size and structure.   Venous: The inferior vena cava is normal in size with greater than 50%  respiratory variability, suggesting right atrial pressure of 3 mmHg.   IAS/Shunts: No atrial level shunt detected by color flow Doppler.   _____________   History of Present Illness     Suzanne Sutton is a 77 y.o. female with hx of HTN, HLD, hypothyroidism, type 2 DM. Patient presented to ER c/o chest pain. She has been having mid-sternum chest tightness for the past 1-2 weeks. It mostly occurring at night before she goes to bed. It's associated with significant daytime fatigue, SOB, bilateral hands and arm numbness. She had been taking aspirin daily for the past few days. She had used her breathing treatment for allergy which seems helping the SOB. She has been experiencing more stress and anxiety at work, and working as a Immunologist. She woke up this morning with chest tightness, around 6 am, pain was persistent and therefore she came to ER. She states her BP has been elevated for the past week, up to 170 systolic. She reports occasional swelling of ankles after a long work day. She recalls rheumatic fever when she was 77 yrs old. She denied any known cardiac disease. She states her father died of MI at age of 67. She denied fever, chills, emesis, abdominal pain, diarrhea. She  reports a chronic cough from allergy. She denied any known drug allergy, denied any hx of major bleeding. She denied smoking, ETOH use, illicit drug use.    Admission diagnostic showed glucose 213, CMP otherwise unremarkable. Lipase 56. Hs trop 38 >174. CBC with Hgb 15.3, grossly unremarkable. D dimer negative. CXR showed No evidence of an acute cardiopulmonary abnormality. Indeterminate 4.2 cm ovoid, peripherally calcified lesion in the left upper quadrant of the abdomen. CT AP is pending. EKG showed sinus rhythm, artifacts, manual QT 400 msec.    Per chart review, she has no known cardiac disease. She has type 2 DM that is managed by metformin, no recent A1C available, 6.5% in 2016 . She takes benazepril-hydrochlorothiazide for HTN and pravastatin for HLD.She has no hx MI, CVA, CKD.   Hospital Course     Consultants: n/a  NSTEMI  Patient s/p balloon angioplasty on 7/9 with Dr. Clifton James followed by atherectomy of RCA on 7/10 with Dr. Maryruth Hancock. Post intervention, there is a 0% residual stenosis. Patient will discharge on DAPT with ASA, Brilinta for at least 12 months. Continue Atorvastatin 40mg , Metoprolol Succinate 25mg .   Hypertension  BP initially elevated, now better controlled with  metoprolol and increased irbesartan 75mg . Plan to resume Benazepril/hydrochlorothiazide 20-25mg  at discharge. Consolidate Metoprolol to succinate 25mg .   Hyperlipidemia  LDL 93, HDL 59. Continue Atorvastatin 40mg .   Hypothyroidism  Continue Synthroid .  DM type II  Resume Metformin 500mg  lunchtime, 1000mg  dinner 48 hours post LHC.       Did the patient have an acute coronary syndrome (MI, NSTEMI, STEMI, etc) this admission?:  Yes                               AHA/ACC Clinical Performance & Quality Measures: Aspirin prescribed? - Yes ADP Receptor Inhibitor (Plavix/Clopidogrel, Brilinta/Ticagrelor or Effient/Prasugrel) prescribed (includes medically managed patients)? - Yes Beta Blocker prescribed?  - Yes High Intensity Statin (Lipitor 40-80mg  or Crestor 20-40mg ) prescribed? - Yes EF assessed during THIS hospitalization? - Yes For EF <40%, was ACEI/ARB prescribed? - Not Applicable (EF >/= 40%) For EF <40%, Aldosterone Antagonist (Spironolactone or Eplerenone) prescribed? - Not Applicable (EF >/= 40%) Cardiac Rehab Phase II ordered (including medically managed patients)? - Yes       The patient will be scheduled for a TOC follow up appointment in 14 days.  A message has been sent to the [facility] and Scheduling Pool at the office where the patient should be seen for follow up.  _____________  Discharge Vitals Blood pressure (!) 119/53, pulse 66, temperature 98.6 F (37 C), temperature source Oral, resp. rate 18, height 5\' 6"  (1.676 m), weight 73.5 kg, SpO2 95%.  Filed Weights   03/23/23 0500 03/23/23 1023 03/24/23 0503  Weight: 73.7 kg 73.7 kg 73.5 kg    Physical Exam Vitals reviewed.  Constitutional:      Appearance: Normal appearance.  HENT:     Head: Normocephalic.  Eyes:     Pupils: Pupils are equal, round, and reactive to light.  Cardiovascular:     Rate and Rhythm: Normal rate and regular rhythm.     Pulses: Normal pulses.     Heart sounds: Normal heart sounds.  Pulmonary:     Effort: Pulmonary effort is normal.     Breath sounds: Normal breath sounds.  Abdominal:     General: Abdomen is flat.     Palpations: Abdomen is soft.  Musculoskeletal:        General: Normal range of motion.     Cervical back: Normal range of motion.  Skin:    General: Skin is warm and dry.     Capillary Refill: Capillary refill takes less than 2 seconds.     Comments: Right radial access site without bleeding, swelling, hematoma.  Neurological:     General: No focal deficit present.     Mental Status: She is oriented to person, place, and time.  Psychiatric:        Mood and Affect: Mood normal.        Behavior: Behavior normal.        Thought Content: Thought content normal.         Judgment: Judgment normal.      Labs & Radiologic Studies    CBC Recent Labs    03/23/23 1955 03/24/23 0124  WBC 8.2 9.9  HGB 14.7 14.6  HCT 43.2 42.8  MCV 88.0 89.2  PLT 226 243   Basic Metabolic Panel Recent Labs    16/10/96 0108 03/23/23 1955 03/24/23 0124  NA 136  --  135  K 3.9  --  3.7  CL 104  --  105  CO2 24  --  21*  GLUCOSE 123*  --  212*  BUN 11  --  14  CREATININE 0.65 0.80 0.95  CALCIUM 9.0  --  8.9   Liver Function Tests No results for input(s): "AST", "ALT", "ALKPHOS", "BILITOT", "PROT", "ALBUMIN" in the last 72 hours.  No results for input(s): "LIPASE", "AMYLASE" in the last 72 hours.  High Sensitivity Troponin:   Recent Labs  Lab 03/21/23 0920 03/21/23 1105  TROPONINIHS 38* 174*    BNP Invalid input(s): "POCBNP" D-Dimer No results for input(s): "DDIMER" in the last 72 hours.  Hemoglobin A1C Recent Labs    03/21/23 1345  HGBA1C 7.0*   Fasting Lipid Panel Recent Labs    03/22/23 0028  CHOL 169  HDL 59  LDLCALC 93  TRIG 87  CHOLHDL 2.9   Thyroid Function Tests Recent Labs    03/21/23 1345  TSH 2.567   _____________  CARDIAC CATHETERIZATION  Result Date: 03/23/2023   Prox RCA lesion is 40% stenosed.   RPDA lesion is 50% stenosed.   Mid RCA lesion is 90% stenosed.   A drug-eluting stent was successfully placed using a STENT ONYX FRONTIER 3.0X26.   Post intervention, there is a 0% residual stenosis.   In the absence of any other complications or medical issues, we expect the patient to be ready for discharge from an interventional cardiology perspective on 03/24/2023. Successful orbital atherectomy and drug-eluting stent placement to the mid right coronary artery. Recommendations: Dual antiplatelet therapy for at least 12 months. Aggressive treatment of risk factors.   CARDIAC CATHETERIZATION  Result Date: 03/22/2023   Prox RCA lesion is 40% stenosed.   Mid RCA lesion is 99% stenosed.   Ost Cx to Prox Cx lesion is 90% stenosed.    Mid LAD lesion is 60% stenosed.   Balloon angioplasty was performed using a BALLN EMERGE MR 2.0X12.   Post intervention, there is a 80% residual stenosis. Large dominant RCA with heavy calcification throughout the proximal, mid and distal vessel. Mild proximal stenosis. Severe, heavily calcified focal mid stenosis. The LAD has moderate mid vessel stenosis (RFR 0.96 suggesting this lesion is not flow limiting) Small non-dominant Circumflex with severe, heavily calcified proximal stenosis. This vessel is not favorable for PCI. Normal LV filling pressure Recommendations: Her LAD does not have flow limiting disease and the Circumflex is not favorable for PCI (vessel size, ostial stenosis, tortuosity). I attempted to treat her RCA today but due to the heavy calcification, I was unable to deliver a lithotripsy balloon. I did perform balloon angioplasty which should provide improved flow down the RCA while we await the next procedure. She will need to have orbital atherectomy. There is no obvious vessel dissection at the balloon angioplasty site so we may be able to perform atherectomy later this week. I am in the cath lab on Thursday of this week and could do it then if we can get industry support. She has been loaded with Plavix.   ECHOCARDIOGRAM COMPLETE  Result Date: 03/21/2023    ECHOCARDIOGRAM REPORT   Patient Name:   Suzanne Sutton Date of Exam: 03/21/2023 Medical Rec #:  [id number]     Height:       66.0 in Accession #:    [id number]    Weight:       172.0 lb Date of Birth:  1945-10-16    BSA:          1.876 m Patient Age:    16  years      BP:           166/82 mmHg Patient Gender: F             HR:           67 bpm. Exam Location:  Inpatient Procedure: 2D Echo, Cardiac Doppler and Color Doppler Indications:    NSTEMI  History:        Patient has no prior history of Echocardiogram examinations.                 Risk Factors:Diabetes and Hypertension.  Sonographer:    Melissa Morford RDCS (AE, PE) Referring Phys:  [id number] Cyndi Bender IMPRESSIONS  1. Left ventricular ejection fraction, by estimation, is 55 to 60%. The left ventricle has normal function. The left ventricle has no regional wall motion abnormalities. Left ventricular diastolic parameters are consistent with Grade I diastolic dysfunction (impaired relaxation).  2. Right ventricular systolic function is normal. The right ventricular size is normal.  3. The mitral valve is normal in structure. No evidence of mitral valve regurgitation. No evidence of mitral stenosis.  4. The aortic valve is normal in structure. Aortic valve regurgitation is not visualized. No aortic stenosis is present.  5. The inferior vena cava is normal in size with greater than 50% respiratory variability, suggesting right atrial pressure of 3 mmHg. FINDINGS  Left Ventricle: Left ventricular ejection fraction, by estimation, is 55 to 60%. The left ventricle has normal function. The left ventricle has no regional wall motion abnormalities. The left ventricular internal cavity size was normal in size. There is  no left ventricular hypertrophy. Left ventricular diastolic parameters are consistent with Grade I diastolic dysfunction (impaired relaxation). Right Ventricle: The right ventricular size is normal. No increase in right ventricular wall thickness. Right ventricular systolic function is normal. Left Atrium: Left atrial size was normal in size. Right Atrium: Right atrial size was normal in size. Pericardium: There is no evidence of pericardial effusion. Mitral Valve: The mitral valve is normal in structure. Mild to moderate mitral annular calcification. No evidence of mitral valve regurgitation. No evidence of mitral valve stenosis. Tricuspid Valve: The tricuspid valve is normal in structure. Tricuspid valve regurgitation is not demonstrated. No evidence of tricuspid stenosis. Aortic Valve: The aortic valve is normal in structure. Aortic valve regurgitation is not visualized. No aortic stenosis  is present. Pulmonic Valve: The pulmonic valve was not well visualized. Pulmonic valve regurgitation is not visualized. No evidence of pulmonic stenosis. Aorta: The aortic root is normal in size and structure. Venous: The inferior vena cava is normal in size with greater than 50% respiratory variability, suggesting right atrial pressure of 3 mmHg. IAS/Shunts: No atrial level shunt detected by color flow Doppler.  LEFT VENTRICLE PLAX 2D LVIDd:         3.80 cm     Diastology LVIDs:         2.90 cm     LV e' medial:    5.11 cm/s LV PW:         1.00 cm     LV E/e' medial:  10.1 LV IVS:        1.10 cm     LV e' lateral:   4.79 cm/s LVOT diam:     2.10 cm     LV E/e' lateral: 10.8 LV SV:         65 LV SV Index:   35 LVOT Area:     3.46 cm  LV Volumes (MOD) LV vol d, MOD A4C: 44.1 ml LV SV MOD A4C:     44.1 ml RIGHT VENTRICLE RV S prime:     9.36 cm/s TAPSE (M-mode): 2.3 cm LEFT ATRIUM             Index        RIGHT ATRIUM           Index LA diam:        3.30 cm 1.76 cm/m   RA Area:     13.40 cm LA Vol (A2C):   27.8 ml 14.82 ml/m  RA Volume:   31.90 ml  17.01 ml/m LA Vol (A4C):   34.0 ml 18.13 ml/m LA Biplane Vol: 33.5 ml 17.86 ml/m  AORTIC VALVE LVOT Vmax:   68.50 cm/s LVOT Vmean:  53.100 cm/s LVOT VTI:    0.189 m  AORTA Ao Root diam: 2.90 cm Ao Asc diam:  3.00 cm MITRAL VALVE MV Area (PHT): 3.56 cm     SHUNTS MV Decel Time: 213 msec     Systemic VTI:  0.19 m MV E velocity: 51.80 cm/s   Systemic Diam: 2.10 cm MV A velocity: 103.00 cm/s MV E/A ratio:  0.50 Arvilla Meres MD Electronically signed by Arvilla Meres MD Signature Date/Time: 03/21/2023/5:35:42 PM    Final    CT CHEST ABDOMEN PELVIS W CONTRAST  Result Date: 03/21/2023 CLINICAL DATA:  Cough and fatigue with abnormal chest x-ray. EXAM: CT CHEST, ABDOMEN, AND PELVIS WITH CONTRAST TECHNIQUE: Multidetector CT imaging of the chest, abdomen and pelvis was performed following the standard protocol during bolus administration of intravenous contrast.  RADIATION DOSE REDUCTION: This exam was performed according to the departmental dose-optimization program which includes automated exposure control, adjustment of the mA and/or kV according to patient size and/or use of iterative reconstruction technique. CONTRAST:  75mL OMNIPAQUE IOHEXOL 350 MG/ML SOLN COMPARISON:  None Available. FINDINGS: CT CHEST FINDINGS Cardiovascular: No acute vascular findings. Normal heart size. No pericardial effusion. Atheromatous calcification of the aorta and coronaries which is extensive. Mediastinum/Nodes: No mass or adenopathy Lungs/Pleura: Mild linear scarring in the right middle lobe and lingula. There are small scattered pulmonary nodules measuring up to 5 mm in left lower lobe on 4:108. Additional dominant nodules highlighted on series 4 including in the left lower lobe on 4:83, right upper lobe on 4:69, and paramediastinal right lower lobe on 4:106. There is no edema, consolidation, effusion, or pneumothorax. Musculoskeletal: Multilevel bridging thoracic osteophytes. Advanced disc degeneration at T11-12. L3-4 and L4-5 high-grade degenerative spinal stenosis. CT ABDOMEN PELVIS FINDINGS Hepatobiliary: No focal liver abnormality.Cholecystectomy. No biliary dilatation. Pancreas: Unremarkable. Spleen: Eggshell calcification around the cystic density in the upper spleen measuring 4 cm. Adrenals/Urinary Tract: Negative adrenals. No hydronephrosis or stone. Unremarkable bladder. Stomach/Bowel: No obstruction. No appendicitis. Moderate stool throughout the colon Vascular/Lymphatic: Diffuse atheromatous calcification of the aorta. No mass or adenopathy. Reproductive:Hysterectomy. Other: No ascites or pneumoperitoneum. Musculoskeletal: No acute finding. Degenerative changes described above. IMPRESSION: 1. No acute finding.  Negative for pneumonia. 2. The left upper quadrant calcification on prior radiograph is a benign splenic pseudocyst. 3. Multiple pulmonary nodules measuring up to 5 mm.  No follow-up needed if patient is low-risk (and has no known or suspected primary neoplasm). Non-contrast chest CT can be considered in 12 months if patient is high-risk. This recommendation follows the consensus statement: Guidelines for Management of Incidental Pulmonary Nodules Detected on CT Images: From the Fleischner Society 2017; Radiology 2017; 284:228-243. 4. Extensive atherosclerosis including the  coronary arteries. Electronically Signed   By: Tiburcio Pea M.D.   On: 03/21/2023 13:02   DG Chest 2 View  Result Date: 03/21/2023 CLINICAL DATA:  Provided history: Chest pain. EXAM: CHEST - 2 VIEW COMPARISON:  Prior chest radiographs 10/09/2021 and earlier. FINDINGS: Heart size within normal limits. Aortic atherosclerosis. No appreciable airspace consolidation or pulmonary edema. No evidence of pleural effusion or pneumothorax. No acute osseous abnormality identified. Degenerative changes of the spine. Indeterminate 4.2 cm ovoid, peripherally calcified lesion in the left upper quadrant of the abdomen. IMPRESSION: 1. No evidence of an acute cardiopulmonary abnormality. 2. Aortic Atherosclerosis (ICD10-I70.0). 3. Indeterminate 4.2 cm ovoid, peripherally calcified lesion in the left upper quadrant of the abdomen. A contrast-enhanced abdominal CT is recommended for further evaluation. Electronically Signed   By: Jackey Loge D.O.   On: 03/21/2023 10:26   Disposition   Pt is being discharged home today in good condition.  Follow-up Plans & Appointments     Discharge Instructions     AMB Referral to Cardiac Rehabilitation - Phase II   Complete by: As directed    Diagnosis:  NSTEMI Coronary Stents     After initial evaluation and assessments completed: Virtual Based Care may be provided alone or in conjunction with Phase 2 Cardiac Rehab based on patient barriers.: Yes   Intensive Cardiac Rehabilitation (ICR) [facility] location only OR Traditional Cardiac Rehabilitation (TCR) *If criteria for ICR are not  met will enroll in TCR [facility] only): Yes   Amb Referral to Cardiac Rehabilitation   Complete by: As directed    Diagnosis:  PTCA NSTEMI     After initial evaluation and assessments completed: Virtual Based Care may be provided alone or in conjunction with Phase 2 Cardiac Rehab based on patient barriers.: Yes   Intensive Cardiac Rehabilitation (ICR) [facility] location only OR Traditional Cardiac Rehabilitation (TCR) *If criteria for ICR are not met will enroll in TCR [facility] only): Yes   Diet - low sodium heart healthy   Complete by: As directed    Discharge instructions   Complete by: As directed    NO HEAVY LIFTING OR SEXUAL ACTIVITY X 7 DAYS. NO DRIVING X 2-3 DAYS. NO SOAKING BATHS, HOT TUBS, POOLS, ETC., X 7 DAYS.   Increase activity slowly   Complete by: As directed         Discharge Medications   Allergies as of 03/24/2023       Reactions   Cozaar [losartan Potassium] Swelling   Had knee swelling   Crestor [rosuvastatin] Swelling   Leg swelling        Medication List     STOP taking these medications    aspirin 325 MG tablet Replaced by: aspirin EC 81 MG tablet   ibuprofen 200 MG tablet Commonly known as: ADVIL   pravastatin 40 MG tablet Commonly known as: PRAVACHOL       TAKE these medications    aspirin EC 81 MG tablet Take 1 tablet (81 mg total) by mouth daily. Swallow whole. Start taking on: March 25, 2023 Replaces: aspirin 325 MG tablet   atorvastatin 40 MG tablet Commonly known as: LIPITOR Take 1 tablet (40 mg total) by mouth daily. Start taking on: March 25, 2023   azelastine 0.1 % nasal spray Commonly known as: ASTELIN Place 1 spray into the nose daily as needed for allergies (post nasal drip). Use in each nostril as directed   benazepril-hydrochlorthiazide 20-25 MG tablet Commonly known as: LOTENSIN HCT Take 1 tablet by  mouth daily.   clonazePAM 0.5 MG tablet Commonly known as: KLONOPIN Take 0.25 mg by mouth at bedtime as needed for anxiety  (sleep).   glucose blood test strip Commonly known as: FREESTYLE TEST STRIPS USE AS INSTRUCTED TO CHECK BLOOD SUGAR ONCE DAILY DX CODE E11.9   levothyroxine 50 MCG tablet Commonly known as: SYNTHROID TAKE 1 TABLET (50 MCG TOTAL) BY MOUTH DAILY BEFORE BREAKFAST.   metFORMIN 500 MG tablet Commonly known as: GLUCOPHAGE Take 500-1,000 mg by mouth See admin instructions. 500 mg at lunch, 1000 mg with dinner.   metoprolol succinate 25 MG 24 hr tablet Commonly known as: TOPROL-XL Take 1 tablet (25 mg total) by mouth daily. Start taking on: March 25, 2023   nitroGLYCERIN 0.4 MG SL tablet Commonly known as: NITROSTAT Place 1 tablet (0.4 mg total) under the tongue every 5 (five) minutes x 3 doses as needed for chest pain.   potassium chloride SA 20 MEQ tablet Commonly known as: KLOR-CON M Take 1 tablet (20 mEq total) by mouth daily. What changed: when to take this   ProAir HFA 108 (90 Base) MCG/ACT inhaler Generic drug: albuterol INHALE 1 TO 2 PUFFS BY MOUTH EVERY 6 HOURS AS NEEDED   ticagrelor 90 MG Tabs tablet Commonly known as: BRILINTA Take 1 tablet (90 mg total) by mouth 2 (two) times daily.   vitamin A 16109 UNIT capsule Take 25,000 Units by mouth daily.           Outstanding Labs/Studies     Duration of Discharge Encounter   Greater than 30 minutes including physician time.  SignedPerlie Gold, PA-C 03/24/2023, 11:06 AM  Agree with note by Perlie Gold PA-C  Pt admitted with NSTEMI. Had unsuccessful attempt at PTA of RCA by Dr. Sanjuana Kava secondary to extreme calcification of the RCA stenosis. Yesterday had successful Orbital Atherectomy/ PCI and DES of RCA by Dr Kirke Corin. Other prob as outlined. Stable for DC home this morning. Will arrange TOC 7 then ROV with me 6-8 weeks. DAPT uninterrupted for 12 months.  Runell Gess, M.D., FACP, [facility], [facility] [address] [place], [place]   [address]  [phone] 03/24/2023 2:41 PM

## 2023-03-24 NOTE — Research (Signed)
[facility] ACS Informed Consent   Subject Name: Suzanne Sutton  Subject met inclusion and exclusion criteria.  The informed consent form, study requirements and expectations were reviewed with the subject and questions and concerns were addressed prior to the signing of the consent form.  The subject verbalized understanding of the trial requirements.  The subject agreed to participate in the [facility] ACS trial and signed the informed consent at 0937 on 11/Jul/2024.  The informed consent was obtained prior to performance of any protocol-specific procedures for the subject.  A copy of the signed informed consent was given to the subject and a copy was placed in the subject's medical record.   Kandis Mannan Travious Vanover     Pt signed and agreed to be in [place] ACS trial. PI, Bonnee Quin MD, saw and examined patient before discharge. Per PI discretion, IP administration will be held until Monday 03/28/23 due to hematoma on right radial site.

## 2023-03-24 NOTE — Plan of Care (Signed)
Problem: Education: Goal: Understanding of CV disease, CV risk reduction, and recovery process will improve Outcome: Adequate for Discharge Goal: Individualized Educational Video(s) Outcome: Adequate for Discharge   Problem: Activity: Goal: Ability to return to baseline activity level will improve Outcome: Adequate for Discharge   Problem: Cardiovascular: Goal: Ability to achieve and maintain adequate cardiovascular perfusion will improve Outcome: Adequate for Discharge Goal: Vascular access site(s) Level 0-1 will be maintained Outcome: Adequate for Discharge   Problem: Health Behavior/Discharge Planning: Goal: Ability to safely manage health-related needs after discharge will improve Outcome: Adequate for Discharge   Problem: Education: Goal: Understanding of cardiac disease, CV risk reduction, and recovery process will improve Outcome: Adequate for Discharge Goal: Individualized Educational Video(s) Outcome: Adequate for Discharge   Problem: Activity: Goal: Ability to tolerate increased activity will improve Outcome: Adequate for Discharge   Problem: Cardiac: Goal: Ability to achieve and maintain adequate cardiovascular perfusion will improve Outcome: Adequate for Discharge   Problem: Health Behavior/Discharge Planning: Goal: Ability to safely manage health-related needs after discharge will improve Outcome: Adequate for Discharge   Problem: Education: Goal: Ability to describe self-care measures that may prevent or decrease complications (Diabetes Survival Skills Education) will improve Outcome: Adequate for Discharge Goal: Individualized Educational Video(s) Outcome: Adequate for Discharge   Problem: Coping: Goal: Ability to adjust to condition or change in health will improve Outcome: Adequate for Discharge   Problem: Fluid Volume: Goal: Ability to maintain a balanced intake and output will improve Outcome: Adequate for Discharge   Problem: Health  Behavior/Discharge Planning: Goal: Ability to identify and utilize available resources and services will improve Outcome: Adequate for Discharge Goal: Ability to manage health-related needs will improve Outcome: Adequate for Discharge   Problem: Metabolic: Goal: Ability to maintain appropriate glucose levels will improve Outcome: Adequate for Discharge   Problem: Nutritional: Goal: Maintenance of adequate nutrition will improve Outcome: Adequate for Discharge Goal: Progress toward achieving an optimal weight will improve Outcome: Adequate for Discharge   Problem: Skin Integrity: Goal: Risk for impaired skin integrity will decrease Outcome: Adequate for Discharge   Problem: Tissue Perfusion: Goal: Adequacy of tissue perfusion will improve Outcome: Adequate for Discharge   Problem: Education: Goal: Knowledge of General Education information will improve Description: Including pain rating scale, medication(s)/side effects and non-pharmacologic comfort measures Outcome: Adequate for Discharge   Problem: Health Behavior/Discharge Planning: Goal: Ability to manage health-related needs will improve Outcome: Adequate for Discharge   Problem: Clinical Measurements: Goal: Ability to maintain clinical measurements within normal limits will improve Outcome: Adequate for Discharge Goal: Will remain free from infection Outcome: Adequate for Discharge Goal: Diagnostic test results will improve Outcome: Adequate for Discharge Goal: Respiratory complications will improve Outcome: Adequate for Discharge Goal: Cardiovascular complication will be avoided Outcome: Adequate for Discharge   Problem: Activity: Goal: Risk for activity intolerance will decrease Outcome: Adequate for Discharge   Problem: Nutrition: Goal: Adequate nutrition will be maintained Outcome: Adequate for Discharge   Problem: Coping: Goal: Level of anxiety will decrease Outcome: Adequate for Discharge   Problem:  Elimination: Goal: Will not experience complications related to bowel motility Outcome: Adequate for Discharge Goal: Will not experience complications related to urinary retention Outcome: Adequate for Discharge   Problem: Pain Managment: Goal: General experience of comfort will improve Outcome: Adequate for Discharge   Problem: Safety: Goal: Ability to remain free from injury will improve Outcome: Adequate for Discharge   Problem: Skin Integrity: Goal: Risk for impaired skin integrity will decrease Outcome: Adequate for Discharge  Problem: Education: Goal: Understanding of CV disease, CV risk reduction, and recovery process will improve Outcome: Adequate for Discharge Goal: Individualized Educational Video(s) Outcome: Adequate for Discharge   Problem: Activity: Goal: Ability to return to baseline activity level will improve Outcome: Adequate for Discharge   Problem: Cardiovascular: Goal: Ability to achieve and maintain adequate cardiovascular perfusion will improve Outcome: Adequate for Discharge Goal: Vascular access site(s) Level 0-1 will be maintained Outcome: Adequate for Discharge   Problem: Health Behavior/Discharge Planning: Goal: Ability to safely manage health-related needs after discharge will improve Outcome: Adequate for Discharge

## 2023-03-24 NOTE — Progress Notes (Signed)
CARDIAC REHAB PHASE I   Attempt to walk pt. Pt in bed with doctors in room having lengthy discussion about anticoagulant medications. Will return as time allows.  Jonna Coup, MS, ACSM-CEP 03/24/2023 9:12 AM

## 2023-03-24 NOTE — Telephone Encounter (Signed)
   Transition of Care Follow-up Phone Call Request    Patient Name: Suzanne Sutton Date of Birth: 01/09/1946 Date of Encounter: 03/24/2023  Primary Care Provider:  Moshe Cipro, NP Primary Cardiologist:  None  Suzanne Sutton has been scheduled for a transition of care follow up appointment with a [facility] provider:  Robet Leu on 7/16@ 10:15 am.  Please reach out to Suzanne Sutton within 48 hours to confirm appointment and review transition of care protocol questionnaire.  Perlie Gold, PA-C  03/24/2023, 11:04 AM

## 2023-03-24 NOTE — Care Management Important Message (Signed)
Important Message  Patient Details  Name: Suzanne Sutton MRN: [id number] Date of Birth: [birth year]   Medicare Important Message Given:  Yes     Renie Ora 03/24/2023, 8:57 AM

## 2023-03-25 NOTE — Telephone Encounter (Signed)
Unable to leave voicemail.

## 2023-03-28 VITALS — BP 118/60 | HR 79 | Ht 66.0 in | Wt 161.5 lb

## 2023-03-28 DIAGNOSIS — Z006 Encounter for examination for normal comparison and control in clinical research program: Secondary | ICD-10-CM

## 2023-03-28 MED ORDER — STUDY - LIBREXIA-ACS - MILVEXIAN 25 MG OR PLACEBO TABLET (PI-STUCKEY)
1.0000 | ORAL_TABLET | Freq: Two times a day (BID) | ORAL | 0 refills | Status: DC
Start: 2023-03-28 — End: 2023-04-25

## 2023-03-28 MED ORDER — STUDY - LIBREXIA-ACS - MILVEXIAN 25 MG OR PLACEBO TABLET (PI-STUCKEY)
1.0000 | ORAL_TABLET | Freq: Once | ORAL | Status: AC
  Administered 2023-03-28: 1 via ORAL
  Filled 2023-03-28: qty 1

## 2023-03-28 NOTE — Telephone Encounter (Signed)
Patient contacted regarding discharge from [facility] on 03/24/23  Patient understands to follow up with provider Robet Leu on 7/16 at 10:15 at [facility] office Patient understands discharge instructions? Yes Patient understands medications and regiment? Yes Patient understands to bring all medications to this visit? Yes  Ask patient:  Are you enrolled in My Chart Yes                  Do you have any questions about your medications?  No questions, will bring medications to appt. All medications (except pain medications) are to be filled by your Cardiologist AFTER your first post op       appointment with them.  Are you taking your pain medication?              How is your pain controlled? Pain level? None                Do you have help at home with ADL's?  None needed

## 2023-03-28 NOTE — Research (Cosign Needed Addendum)
 [facility] ACS Screening/Randomization Visit    Were all eligibility criteria met? [x] Yes [] No   Pregnancy Test Was the sample collected? [] Yes [] No [x] N/A Was the collection date the same as the visit date? [] Yes [] No Specimen Type: [] Serum [] Urine Collection Date: Collection Time: Pregnancy Test Result: [] Positive [] Negative [] Borderline [] Invalid   Randomization What was the date of randomization?: 15/Jul/2024 What was the time of randomization?: 0939 PD sampling Value: no PK Sampling Value: yes   [facility] Hematology/Chemistry drawn: [x] Yes [] No   Study Drug Administration Was dose administered?  [x] Yes [] No Time dose administered: 1045   Medication Kit Accountability Dispensed Status [x] Dispensed [] Dispensed In Error [] Not Dispensed If 'Not Dispensed', provide reason: [] Medical Decision [] Medication Kit not available or damaged [] Study medication discontinued [] Visit skipped or administration skipped [] Medication kit dispensed in error Date Dispensed:15/Jul/2024 Amount Dispensed: [id number] (Milvexian) 25mg  or Placebo - [id number]    Physical Exam Was the physical examination performed? [x] Yes [] No Was the examination date the same as the visit date? [] Yes [x] No   EQ-5D-5L Assessment Completed? [x] Yes [] No Reason not done: [] Subject Forgot [] Subject too ill [] Subject refused [] Technical failure [] Other If Other, Specify   Subject ID card given: [x] Yes [] No   Are there any labs that are clinically significant?  Yes []  OR No[x]               Is the patient eligible to continue enrollment in the study after screening visit?  Yes [x]  OR No[]      Current Outpatient Medications:    aspirin  EC 81 MG tablet, Take 1 tablet (81 mg total) by mouth daily. Swallow whole., Disp: 120 tablet, Rfl: 3   atorvastatin  (LIPITOR) 40 MG tablet, Take 1 tablet (40 mg total) by mouth daily., Disp: 30 tablet, Rfl: 3   azelastine (ASTELIN) 137 MCG/SPRAY nasal spray,  Place 1 spray into the nose daily as needed for allergies (post nasal drip). Use in each nostril as directed, Disp: , Rfl:    benazepril -hydrochlorthiazide (LOTENSIN  HCT) 20-25 MG per tablet, Take 1 tablet by mouth daily., Disp: , Rfl:    Beta Carotene (VITAMIN A) 25000 UNIT capsule, Take 25,000 Units by mouth daily., Disp: , Rfl:    clonazePAM  (KLONOPIN ) 0.5 MG tablet, Take 0.25 mg by mouth at bedtime as needed for anxiety (sleep)., Disp: , Rfl:    glucose blood (FREESTYLE TEST STRIPS) test strip, USE AS INSTRUCTED TO CHECK BLOOD SUGAR ONCE DAILY DX CODE E11.9, Disp: 50 each, Rfl: 0   levothyroxine  (SYNTHROID , LEVOTHROID) 50 MCG tablet, TAKE 1 TABLET (50 MCG TOTAL) BY MOUTH DAILY BEFORE BREAKFAST., Disp: 30 tablet, Rfl: 5   metFORMIN  (GLUCOPHAGE ) 500 MG tablet, Take 500-1,000 mg by mouth See admin instructions. 500 mg at lunch, 1000 mg with dinner., Disp: , Rfl:    metoprolol  succinate (TOPROL -XL) 25 MG 24 hr tablet, Take 1 tablet (25 mg total) by mouth daily., Disp: 30 tablet, Rfl: 3   nitroGLYCERIN  (NITROSTAT ) 0.4 MG SL tablet, Place 1 tablet (0.4 mg total) under the tongue every 5 (five) minutes x 3 doses as needed for chest pain., Disp: 25 tablet, Rfl: 0   potassium chloride  SA (K-DUR,KLOR-CON ) 20 MEQ tablet, Take 1 tablet (20 mEq total) by mouth daily. (Patient taking differently: Take 20 mEq by mouth daily with lunch.), Disp: 30 tablet, Rfl: 3   PROAIR  HFA 108 (90 BASE) MCG/ACT inhaler, INHALE 1 TO 2 PUFFS BY MOUTH EVERY 6 HOURS AS NEEDED, Disp: , Rfl: 1   Study - [facility]-ACS - milvexian 25 mg  or placebo tablet (PI-Stuckey), Take 1 tablet by mouth 2 (two) times daily. Take with or without food. Bring bottle back to every visit. For Investigational Use Only. Please contact [facility] for any questions or concerns regarding this medication., Disp: 70 tablet, Rfl: 0   ticagrelor  (BRILINTA ) 90 MG TABS tablet, Take 1 tablet (90 mg total) by mouth 2 (two) times daily., Disp: 60 tablet,  Rfl: 3  Current Facility-Administered Medications:    Study - LIBREXIA-ACS - milvexian 25 mg or placebo tablet (PI-Stuckey), 1 tablet, Oral, Once,

## 2023-03-28 NOTE — Progress Notes (Unsigned)
Cardiology Office Note:  .   Date:  03/28/2023  ID:  Suzanne Sutton, DOB Sep 05, 1946, MRN [id number] PCP: Suzanne Cipro, NP  [facility] Providers Cardiologist:  Nanetta Batty, MD    History of Present Illness: .   Suzanne Sutton is a 77 y.o. female with past medical history of CAD, hypertension, hyperlipidemia, hypothyroidism, type 2 diabetes.  Patient is followed by Dr. Allyson Sabal and presents today for hospital follow-up appointment.  Per chart review, patient presented to the ED on 7/8 complaining of chest pain.  She had woken up that morning with chest tightness that was persistent.  Found to have high-sensitivity troponin 38, 174.  She underwent echocardiogram on 03/21/2023 that showed EF 55-60% with no regional wall motion abnormalities, grade 1 diastolic dysfunction, normal RV systolic function.  She underwent left heart catheterization on 03/22/2023 that showed 40% stenosis in proximal RCA, 99% stenosis in mid RCA, 90% stenosis in ostial-proximal Cx,, 60% stenosis in mid LAD.  Patient was treated with balloon angioplasty to the RCA.  However, due to the heavy calcification, unable to deliver a lithotripsy balloon.  Her LAD did not have flow-limiting disease in the circumflex was not favorable for PCI.  Patient was taken back to the Cath Lab on 7/10 for intervention on the RCA.  Underwent successful orbital atherectomy and DES placement to the mid RCA.  Recommended patient be on dual antiplatelet therapy for at least 12 months.  Patient was discharged on 7/11 on aspirin, Brilinta, Lipitor, metoprolol succinate.  BP was managed with benazepril/hydrochlorothiazide.  Patient is accompanied by her son today. She reports that on Sunday night, she had an episode of mild chest pressure, and she felt like she had to work harder to breath. She drank some Pepsi, and after burping her symptoms resolved. She has not had any chest pain, pressure, tightness, or discomfort otherwise. Denies chest pain on  exertion. Denies shortness of breath, DOE, orthopnea. She goes have allergies, but her symptoms are well controlled with nasal spray and over the counter allergy medications. She denies dizziness, syncope, near syncope. She does have some bruising on her arms near her right radial cath site and where her Ivs were placed, but the bruises are soft, non tender, and light yellow and green in color. No bleeding since starting DAPT  ROS: Denies chest pain on exertion, shortness of breath, palpitations, dizziness, syncope, near syncope, ankle edema   Studies Reviewed: .   Cardiac Studies & Procedures   CARDIAC CATHETERIZATION  CARDIAC CATHETERIZATION 03/23/2023  Narrative   Prox RCA lesion is 40% stenosed.   RPDA lesion is 50% stenosed.   Mid RCA lesion is 90% stenosed.   A drug-eluting stent was successfully placed using a STENT ONYX FRONTIER 3.0X26.   Post intervention, there is a 0% residual stenosis.   In the absence of any other complications or medical issues, we expect the patient to be ready for discharge from an interventional cardiology perspective on 03/24/2023.  Successful orbital atherectomy and drug-eluting stent placement to the mid right coronary artery.  Recommendations: Dual antiplatelet therapy for at least 12 months. Aggressive treatment of risk factors.  Findings Coronary Findings Diagnostic  Dominance: Right  Right Coronary Artery Vessel is large. Prox RCA lesion is 40% stenosed. Mid RCA lesion is 90% stenosed. The lesion is severely calcified. The lesion was previously treated using angioplasty .  Right Posterior Descending Artery RPDA lesion is 50% stenosed.  Intervention  Mid RCA lesion Atherectomy CATH LAUNCHER 6FR AL.75  guide catheter was inserted. WIRE VIPERWIRE COR FLEX .012 guidewire was used to cross lesion. Orbital atherectomy was performed using a CROWN DIAMONDBACK CLASSIC 1.25. 10 passes taken. Multiple passes on low speed were  performed. Stent Lesion length:  22 mm. Lesion crossed with guidewire using a WIRE RUNTHROUGH .[id number]. Pre-stent angioplasty was performed using a BALLN SCOREFLEX 2.50X15. Maximum pressure:  12 atm. Inflation time:  30 sec. A drug-eluting stent was successfully placed using a STENT ONYX FRONTIER 3.0X26. Maximum pressure: 14 atm. Inflation time: 20 sec. Post-stent angioplasty was performed using a BALL SAPPHIRE NC24 3.25X15. Maximum pressure:  14 atm. Inflation time:  20 sec. Post-Intervention Lesion Assessment The intervention was successful. Pre-interventional TIMI flow is 3. Post-intervention TIMI flow is 3. No complications occurred at this lesion. There is a 0% residual stenosis post intervention.   CARDIAC CATHETERIZATION 03/22/2023  Narrative   Prox RCA lesion is 40% stenosed.   Mid RCA lesion is 99% stenosed.   Ost Cx to Prox Cx lesion is 90% stenosed.   Mid LAD lesion is 60% stenosed.   Balloon angioplasty was performed using a BALLN EMERGE MR 2.0X12.   Post intervention, there is a 80% residual stenosis.  Large dominant RCA with heavy calcification throughout the proximal, mid and distal vessel. Mild proximal stenosis. Severe, heavily calcified focal mid stenosis. The LAD has moderate mid vessel stenosis (RFR 0.96 suggesting this lesion is not flow limiting) Small non-dominant Circumflex with severe, heavily calcified proximal stenosis. This vessel is not favorable for PCI. Normal LV filling pressure  Recommendations: Her LAD does not have flow limiting disease and the Circumflex is not favorable for PCI (vessel size, ostial stenosis, tortuosity). I attempted to treat her RCA today but due to the heavy calcification, I was unable to deliver a lithotripsy balloon. I did perform balloon angioplasty which should provide improved flow down the RCA while we await the next procedure. She will need to have orbital atherectomy. There is no obvious vessel dissection at the balloon angioplasty  site so we may be able to perform atherectomy later this week. I am in the cath lab on Thursday of this week and could do it then if we can get industry support. She has been loaded with Plavix.  Findings Coronary Findings Diagnostic  Dominance: Right  Left Anterior Descending Vessel is large. Mid LAD lesion is 60% stenosed.  Left Circumflex Vessel is small. Ost Cx to Prox Cx lesion is 90% stenosed. The lesion is calcified.  Right Coronary Artery Vessel is large. Prox RCA lesion is 40% stenosed. Mid RCA lesion is 99% stenosed. The lesion is calcified.  Intervention  Mid RCA lesion Angioplasty CATH LAUNCHER 6FR JR4 guide catheter was inserted. WIRE ASAHI PROWATER 180CM guidewire used to cross lesion. Balloon angioplasty was performed using a BALLN EMERGE MR 2.0X12. Post-Intervention Lesion Assessment The intervention was successful. Pre-interventional TIMI flow is 3. Post-intervention TIMI flow is 3. No complications occurred at this lesion. There is a 80% residual stenosis post intervention.     ECHOCARDIOGRAM  ECHOCARDIOGRAM COMPLETE 03/21/2023  Narrative ECHOCARDIOGRAM REPORT    Patient Name:   Suzanne Sutton Date of Exam: 03/21/2023 Medical Rec #:  [id number]     Height:       66.0 in Accession #:    [id number]    Weight:       172.0 lb Date of Birth:  Apr 19, 1946    BSA:          1.876 m Patient Age:  76 years      BP:           166/82 mmHg Patient Gender: F             HR:           67 bpm. Exam Location:  Inpatient  Procedure: 2D Echo, Cardiac Doppler and Color Doppler  Indications:    NSTEMI  History:        Patient has no prior history of Echocardiogram examinations. Risk Factors:Diabetes and Hypertension.  Sonographer:    Melissa Morford RDCS (AE, PE) Referring Phys: [id number] Cyndi Bender  IMPRESSIONS   1. Left ventricular ejection fraction, by estimation, is 55 to 60%. The left ventricle has normal function. The left ventricle has no regional wall motion  abnormalities. Left ventricular diastolic parameters are consistent with Grade I diastolic dysfunction (impaired relaxation). 2. Right ventricular systolic function is normal. The right ventricular size is normal. 3. The mitral valve is normal in structure. No evidence of mitral valve regurgitation. No evidence of mitral stenosis. 4. The aortic valve is normal in structure. Aortic valve regurgitation is not visualized. No aortic stenosis is present. 5. The inferior vena cava is normal in size with greater than 50% respiratory variability, suggesting right atrial pressure of 3 mmHg.  FINDINGS Left Ventricle: Left ventricular ejection fraction, by estimation, is 55 to 60%. The left ventricle has normal function. The left ventricle has no regional wall motion abnormalities. The left ventricular internal cavity size was normal in size. There is no left ventricular hypertrophy. Left ventricular diastolic parameters are consistent with Grade I diastolic dysfunction (impaired relaxation).  Right Ventricle: The right ventricular size is normal. No increase in right ventricular wall thickness. Right ventricular systolic function is normal.  Left Atrium: Left atrial size was normal in size.  Right Atrium: Right atrial size was normal in size.  Pericardium: There is no evidence of pericardial effusion.  Mitral Valve: The mitral valve is normal in structure. Mild to moderate mitral annular calcification. No evidence of mitral valve regurgitation. No evidence of mitral valve stenosis.  Tricuspid Valve: The tricuspid valve is normal in structure. Tricuspid valve regurgitation is not demonstrated. No evidence of tricuspid stenosis.  Aortic Valve: The aortic valve is normal in structure. Aortic valve regurgitation is not visualized. No aortic stenosis is present.  Pulmonic Valve: The pulmonic valve was not well visualized. Pulmonic valve regurgitation is not visualized. No evidence of pulmonic  stenosis.  Aorta: The aortic root is normal in size and structure.  Venous: The inferior vena cava is normal in size with greater than 50% respiratory variability, suggesting right atrial pressure of 3 mmHg.  IAS/Shunts: No atrial level shunt detected by color flow Doppler.   LEFT VENTRICLE PLAX 2D LVIDd:         3.80 cm     Diastology LVIDs:         2.90 cm     LV e' medial:    5.11 cm/s LV PW:         1.00 cm     LV E/e' medial:  10.1 LV IVS:        1.10 cm     LV e' lateral:   4.79 cm/s LVOT diam:     2.10 cm     LV E/e' lateral: 10.8 LV SV:         65 LV SV Index:   35 LVOT Area:     3.46 cm  LV Volumes (MOD) LV  vol d, MOD A4C: 44.1 ml LV SV MOD A4C:     44.1 ml  RIGHT VENTRICLE RV S prime:     9.36 cm/s TAPSE (M-mode): 2.3 cm  LEFT ATRIUM             Index        RIGHT ATRIUM           Index LA diam:        3.30 cm 1.76 cm/m   RA Area:     13.40 cm LA Vol (A2C):   27.8 ml 14.82 ml/m  RA Volume:   31.90 ml  17.01 ml/m LA Vol (A4C):   34.0 ml 18.13 ml/m LA Biplane Vol: 33.5 ml 17.86 ml/m AORTIC VALVE LVOT Vmax:   68.50 cm/s LVOT Vmean:  53.100 cm/s LVOT VTI:    0.189 m  AORTA Ao Root diam: 2.90 cm Ao Asc diam:  3.00 cm  MITRAL VALVE MV Area (PHT): 3.56 cm     SHUNTS MV Decel Time: 213 msec     Systemic VTI:  0.19 m MV E velocity: 51.80 cm/s   Systemic Diam: 2.10 cm MV A velocity: 103.00 cm/s MV E/A ratio:  0.50  Arvilla Meres MD Electronically signed by Arvilla Meres MD Signature Date/Time: 03/21/2023/5:35:42 PM    Final             Risk Assessment/Calculations:             Physical Exam:   VS:  There were no vitals taken for this visit.   Wt Readings from Last 3 Encounters:  03/28/23 161 lb 8 oz (73.3 kg)  03/24/23 162 lb (73.5 kg)  05/30/15 181 lb 12.8 oz (82.5 kg)    GEN: Well nourished, well developed in no acute distress. Sitting comfortably in the chair  NECK: No JVD; No carotid bruits CARDIAC: RRR, no murmurs, rubs,  gallops. Right radial cath site is soft, nontender. No bleeding. There is ecchymoses present near right radial cath site and bilateral Acs, but these are soft and nontender  RESPIRATORY:  Clear to auscultation without rales, wheezing or rhonchi. Normal work of breathing on room air  ABDOMEN: Soft, non-tender, non-distended EXTREMITIES:  No edema in BLE; No deformity   ASSESSMENT AND PLAN: .    CAD -Patient underwent left heart catheterization on 7/9 that showed 40% stenosis in proximal RCA, 99% stenosis mid RCA, 90% stenosis in ostial-proximal circumflex, 60% stenosis in mid LAD.  Disease in the LAD was not flow-limiting and the circumflex is not favorable for PCI.  Patient was treated with balloon angioplasty to the RCA.  Related return to the Cath Lab on 7/10 for intervention on the RCA and underwent successful orbital atherectomy and DES placement to the mid RCA - Patient had one episode of mild chest pressure since PCI, but pain was relieved by burping. No chest pain otherwise. No exertional chest pain or DOE - Right radial cath site stable. She does have ecchymosis at the IV sites and near right radial cath site, but they are soft and nontender. No hematoma noted. Instructed her to call office if bruising worsens, but it appears to be healing normally on exam (bruising is light yellow, green)  - Continue aspirin, Brilinta for at least 12 months- patient denies bleeding  - Patient is enrolled in the librexia ACS study  - Continue Lipitor 40 mg daily - Continue metoprolol succinate 50 mg daily - Checking CBP today   HLD  - Lipid panel  from 03/2023 showed LDL 93, HDL 59, triglycerides 87, total cholesterol 169  - Continue lipitor 40 mg daily  - Ordered lipid panel and LFTs to be drawn in 8 weeks   HTN  - BP well controlled on current medications - Checking BMP on benazepril-hydrochlorothiazide as patient had low K during her admission   Hypothyroidism  - Continue synthroid 50 mcg  -  Follow up with PCP   Type 2 DM - A1c 7.0 on 03/21/23  - Continue metformin  - Follow up with PCP   Dispo: Follow up in 3 months   Signed, Jonita Albee, PA-C

## 2023-03-28 NOTE — Progress Notes (Signed)
Patient returns today for her randomization visit.  Overall doing well.  She has had a cough which she ascribes to allergies, a frequent occurrence for her.  I mentioned to keep an eye on this as it could be related to Brilinta.  She has done well, but noted a little chest discomfort last evening that resolved after a bit of coca cola and burp.  But she said she could not distinguish it from what brought her to the hospital.  She feels great today and cardiology follow up is scheduled for tomorrow.  The ecchymosis at the IV sites and radial site has spread, but there is no hematoma associated with it and it is probably from the original blood draws and radial puncture site.    There is no evidence of hematoma at her radial site, but a large area of ecchymosis.  We reviewed again the objectives of the Librexia ACS study, and she meets the inclusion and exclusion criteria for enrollment.  I explained to her that we would be following her closely.  Randomization was signed.    Arturo Morton. Matilyn Fehrman MD Medical Director, [facility]

## 2023-03-29 ENCOUNTER — Ambulatory Visit: Payer: PPO | Attending: Cardiology | Admitting: Cardiology

## 2023-03-29 ENCOUNTER — Encounter: Payer: Self-pay | Admitting: Cardiology

## 2023-03-29 VITALS — BP 110/78 | HR 95 | Ht 66.0 in | Wt 161.4 lb

## 2023-03-29 DIAGNOSIS — I10 Essential (primary) hypertension: Secondary | ICD-10-CM | POA: Diagnosis not present

## 2023-03-29 DIAGNOSIS — E782 Mixed hyperlipidemia: Secondary | ICD-10-CM

## 2023-03-29 DIAGNOSIS — E038 Other specified hypothyroidism: Secondary | ICD-10-CM | POA: Diagnosis not present

## 2023-03-29 DIAGNOSIS — I2510 Atherosclerotic heart disease of native coronary artery without angina pectoris: Secondary | ICD-10-CM

## 2023-03-29 NOTE — Patient Instructions (Signed)
Medication Instructions:  The current medical regimen is effective;  continue present plan and medications as directed. Please refer to the Current Medication list given to you today.  *If you need a refill on your cardiac medications before your next appointment, please call your pharmacy*  Lab Work: BMET AND CBC TODAY AND  FASTING LIPID AND LFT IN 8 WEEKS (9-10) If you have labs (blood work) drawn today and your tests are completely normal, you will receive your results only by:  MyChart Message (if you have MyChart) OR  A paper copy in the mail If you have any lab test that is abnormal or we need to change your treatment, we will call you to review the results.  Testing/Procedures: NONE  Follow-Up: At [facility], you and your health needs are our priority.  As part of our continuing mission to provide you with exceptional heart care, we have created designated Provider Care Teams.  These Care Teams include your primary Cardiologist (physician) and Advanced Practice Providers (APPs -  Physician Assistants and Nurse Practitioners) who all work together to provide you with the care you need, when you need it.  Your next appointment:   3 month(s)  Provider:   Robet Leu, PA-C or or ANY APP     Other Instructions

## 2023-03-30 LAB — CBC
Hematocrit: 47.4 % — ABNORMAL HIGH (ref 34.0–46.6)
Hemoglobin: 16 g/dL — ABNORMAL HIGH (ref 11.1–15.9)
MCH: 30.5 pg (ref 26.6–33.0)
MCHC: 33.8 g/dL (ref 31.5–35.7)
MCV: 90 fL (ref 79–97)
Platelets: 302 10*3/uL (ref 150–450)
RBC: 5.25 x10E6/uL (ref 3.77–5.28)
RDW: 12.5 % (ref 11.7–15.4)
WBC: 10 10*3/uL (ref 3.4–10.8)

## 2023-03-30 LAB — BASIC METABOLIC PANEL
BUN/Creatinine Ratio: 19 (ref 12–28)
BUN: 16 mg/dL (ref 8–27)
CO2: 24 mmol/L (ref 20–29)
Calcium: 10.1 mg/dL (ref 8.7–10.3)
Chloride: 97 mmol/L (ref 96–106)
Creatinine, Ser: 0.83 mg/dL (ref 0.57–1.00)
Glucose: 170 mg/dL — ABNORMAL HIGH (ref 70–99)
Potassium: 4.1 mmol/L (ref 3.5–5.2)
Sodium: 137 mmol/L (ref 134–144)
eGFR: 73 mL/min/{1.73_m2} (ref >59)

## 2023-04-01 ENCOUNTER — Encounter (HOSPITAL_COMMUNITY): Payer: Self-pay

## 2023-04-06 ENCOUNTER — Telehealth (HOSPITAL_COMMUNITY): Payer: Self-pay

## 2023-04-07 ENCOUNTER — Encounter (HOSPITAL_COMMUNITY)
Admission: RE | Admit: 2023-04-07 | Discharge: 2023-04-07 | Disposition: A | Discharge status: Home / Self Care | Payer: PPO | Source: Ambulatory Visit | Attending: Cardiovascular Disease | Admitting: Cardiovascular Disease

## 2023-04-07 VITALS — BP 108/68 | HR 96 | Ht 66.0 in | Wt 163.6 lb

## 2023-04-07 DIAGNOSIS — I214 Non-ST elevation (NSTEMI) myocardial infarction: Secondary | ICD-10-CM | POA: Insufficient documentation

## 2023-04-07 DIAGNOSIS — Z955 Presence of coronary angioplasty implant and graft: Secondary | ICD-10-CM | POA: Diagnosis present

## 2023-04-07 NOTE — Progress Notes (Signed)
Cardiac Individual Treatment Plan  Patient Details  Name: Suzanne Sutton MRN: [id number] Date of Birth: 01-Jul-1946 Referring Provider:   Flowsheet Row INTENSIVE CARDIAC REHAB ORIENT from 04/07/2023 in [facility]  Referring Provider Nanetta Batty, MD       Initial Encounter Date:  Flowsheet Row INTENSIVE CARDIAC REHAB ORIENT from 04/07/2023 in [facility]  Date 04/07/23       Visit Diagnosis: NSTEMI (non-ST elevated myocardial infarction) (HCC)  S/P drug eluting coronary stent placement  Patient's Home Medications on Admission:  Current Outpatient Medications:    aspirin EC 81 MG tablet, Take 1 tablet (81 mg total) by mouth daily. Swallow whole., Disp: 120 tablet, Rfl: 3   atorvastatin (LIPITOR) 40 MG tablet, Take 1 tablet (40 mg total) by mouth daily., Disp: 30 tablet, Rfl: 3   azelastine (ASTELIN) 137 MCG/SPRAY nasal spray, Place 1 spray into the nose daily as needed for allergies (post nasal drip). Use in each nostril as directed, Disp: , Rfl:    benazepril-hydrochlorthiazide (LOTENSIN HCT) 20-25 MG per tablet, Take 1 tablet by mouth daily., Disp: , Rfl:    Beta Carotene (VITAMIN A) 25000 UNIT capsule, Take 25,000 Units by mouth daily., Disp: , Rfl:    clonazePAM (KLONOPIN) 0.5 MG tablet, Take 0.25 mg by mouth at bedtime as needed for anxiety (sleep)., Disp: , Rfl:    glucose blood (FREESTYLE TEST STRIPS) test strip, USE AS INSTRUCTED TO CHECK BLOOD SUGAR ONCE DAILY DX CODE E11.9, Disp: 50 each, Rfl: 0   levothyroxine (SYNTHROID, LEVOTHROID) 50 MCG tablet, TAKE 1 TABLET (50 MCG TOTAL) BY MOUTH DAILY BEFORE BREAKFAST., Disp: 30 tablet, Rfl: 5   metFORMIN (GLUCOPHAGE) 500 MG tablet, Take 500-1,000 mg by mouth See admin instructions. 500 mg at lunch, 1000 mg with dinner., Disp: , Rfl:    metoprolol succinate (TOPROL-XL) 25 MG 24 hr tablet, Take 1 tablet (25 mg total)  by mouth daily., Disp: 30 tablet, Rfl: 3   nitroGLYCERIN (NITROSTAT) 0.4 MG SL tablet, Place 1 tablet (0.4 mg total) under the tongue every 5 (five) minutes x 3 doses as needed for chest pain., Disp: 25 tablet, Rfl: 0   potassium chloride SA (K-DUR,KLOR-CON) 20 MEQ tablet, Take 1 tablet (20 mEq total) by mouth daily. (Patient taking differently: Take 20 mEq by mouth daily with lunch.), Disp: 30 tablet, Rfl: 3   PROAIR HFA 108 (90 BASE) MCG/ACT inhaler, INHALE 1 TO 2 PUFFS BY MOUTH EVERY 6 HOURS AS NEEDED, Disp: , Rfl: 1   Study - LIBREXIA-ACS - milvexian 25 mg or placebo tablet (PI-Stuckey), Take 1 tablet by mouth 2 (two) times daily. Take with or without food. Bring bottle back to every visit. For Investigational Use Only. Please contact [facility] for any questions or concerns regarding this medication., Disp: 70 tablet, Rfl: 0   ticagrelor (BRILINTA) 90 MG TABS tablet, Take 1 tablet (90 mg total) by mouth 2 (two) times daily., Disp: 60 tablet, Rfl: 3  Past Medical History: Past Medical History:  Diagnosis Date   Anxiety 11/16/2013   Hyperlipidemia 05/09/2013   Hypertension 01/27/2011   Hypokalemia 11/16/2013   Hypothyroidism 05/11/2013   Seasonal allergies 01/27/2011   Uterine cancer (HCC)     Tobacco Use: Social History   Tobacco Use  Smoking Status Never  Smokeless Tobacco Never    Labs: Review Flowsheet  More data exists  Latest Ref Rng & Units 05/15/2014 11/08/2014 05/28/2015 03/21/2023 03/22/2023  Labs for ITP Cardiac and Pulmonary Rehab  Cholestrol 0 - 200 mg/dL 161  - - - 096   LDL (calc) 0 - 99 mg/dL 84  - - - 93   HDL-C >04 mg/dL 54.09  - - - 59   Trlycerides <150 mg/dL 811.9  - - - 87   Hemoglobin A1c 4.8 - 5.6 % 6.6  6.6  6.5  7.0  -    Details            Capillary Blood Glucose: Lab Results  Component Value Date   GLUCAP 171 (H) 03/24/2023   GLUCAP 224 (H) 03/23/2023   GLUCAP 129 (H) 03/23/2023   GLUCAP 125 (H) 03/23/2023   GLUCAP 153  (H) 03/23/2023     Exercise Target Goals: Exercise Program Goal: Individual exercise prescription set using results from initial 6 min walk test and THRR while considering  patient's activity barriers and safety.   Exercise Prescription Goal: Initial exercise prescription builds to 30-45 minutes a day of aerobic activity, 2-3 days per week.  Home exercise guidelines will be given to patient during program as part of exercise prescription that the participant will acknowledge.  Activity Barriers & Risk Stratification:  Activity Barriers & Cardiac Risk Stratification - 04/07/23 1519       Activity Barriers & Cardiac Risk Stratification   Activity Barriers Deconditioning;Shortness of Breath;Neck/Spine Problems;Assistive Device;Other (comment)    Comments dizziness    Cardiac Risk Stratification High   <5 METs on            6 Minute Walk:  6 Minute Walk     Row Name 04/07/23 1517         6 Minute Walk   Phase Initial     Distance 480 feet     Walk Time 6 minutes     # of Rest Breaks 1  2:30-4:00 3/10 lightheadedness, resolved and pt continued. see note in ITP for details     MPH 0.9     METS 1.21     RPE 13     Perceived Dyspnea  1     VO2 Peak 4.23     Symptoms Yes (comment)     Comments 3/10 lightheadedness. VSS, resolved with seated rest break. RPD = 1, pt has allergy cough and resolved with rest     Resting HR 96 bpm     Resting BP 108/68     Resting Oxygen Saturation  100 %     Exercise Oxygen Saturation  during 6 min walk 98 %     Max Ex. HR 112 bpm     Max Ex. BP 126/66     2 Minute Post BP 114/68              Oxygen Initial Assessment:   Oxygen Re-Evaluation:   Oxygen Discharge (Final Oxygen Re-Evaluation):   Initial Exercise Prescription:  Initial Exercise Prescription - 04/07/23 1500       Date of Initial Exercise RX and Referring Provider   Date 04/07/23    Referring Provider Nanetta Batty, MD    Expected Discharge Date 06/29/23       T5 Nustep   Level 1    SPM 50    Minutes 30    METs 1.5      Prescription Details   Frequency (times per week) 3    Duration Progress to 30 minutes of continuous aerobic  without signs/symptoms of physical distress      Intensity   THRR 40-80% of Max Heartrate 57-114    Ratings of Perceived Exertion 11-13    Perceived Dyspnea 0-4      Progression   Progression Continue progressive overload as per policy without signs/symptoms or physical distress.      Resistance Training   Training Prescription Yes    Weight 2    Reps 10-15             Perform Capillary Blood Glucose checks as needed.  Exercise Prescription Changes:   Exercise Comments:   Exercise Goals and Review:   Exercise Goals     Row Name 04/07/23 1342             Exercise Goals   Increase Physical Activity Yes       Intervention Provide advice, education, support and counseling about physical activity/exercise needs.;Develop an individualized exercise prescription for aerobic and resistive training based on initial evaluation findings, risk stratification, comorbidities and participant's personal goals.       Expected Outcomes Short Term: Attend rehab on a regular basis to increase amount of physical activity.;Long Term: Exercising regularly at least 3-5 days a week.;Long Term: Add in home exercise to make exercise part of routine and to increase amount of physical activity.       Increase Strength and Stamina Yes       Intervention Provide advice, education, support and counseling about physical activity/exercise needs.;Develop an individualized exercise prescription for aerobic and resistive training based on initial evaluation findings, risk stratification, comorbidities and participant's personal goals.       Expected Outcomes Short Term: Increase workloads from initial exercise prescription for resistance, speed, and METs.;Short Term: Perform resistance training exercises routinely during rehab  and add in resistance training at home;Long Term: Improve cardiorespiratory fitness, muscular endurance and strength as measured by increased METs and functional capacity ( )       Able to understand and use rate of perceived exertion (RPE) scale Yes       Intervention Provide education and explanation on how to use RPE scale       Expected Outcomes Short Term: Able to use RPE daily in rehab to express subjective intensity level;Long Term:  Able to use RPE to guide intensity level when exercising independently       Knowledge and understanding of Target Heart Rate Range (THRR) Yes       Intervention Provide education and explanation of THRR including how the numbers were predicted and where they are located for reference       Expected Outcomes Short Term: Able to state/look up THRR;Short Term: Able to use daily as guideline for intensity in rehab;Long Term: Able to use THRR to govern intensity when exercising independently       Understanding of Exercise Prescription Yes       Intervention Provide education, explanation, and written materials on patient's individual exercise prescription       Expected Outcomes Short Term: Able to explain program exercise prescription;Long Term: Able to explain home exercise prescription to exercise independently                Exercise Goals Re-Evaluation :   Discharge Exercise Prescription (Final Exercise Prescription Changes):   Nutrition:  Target Goals: Understanding of nutrition guidelines, daily intake of sodium 1500mg , cholesterol 200mg , calories 30% from fat and 7% or less from saturated fats, daily to have 5 or more servings of fruits and  vegetables.  Biometrics:  Pre Biometrics - 04/07/23 1328       Pre Biometrics   Waist Circumference 42 inches    Hip Circumference 41 inches    Waist to Hip Ratio 1.02 %    Triceps Skinfold 27 mm    % Body Fat 41.1 %    Grip Strength 18 kg    Flexibility 0 in   not done due to back issues    Single Leg Stand 8.87 seconds              Nutrition Therapy Plan and Nutrition Goals:   Nutrition Assessments:  MEDIFICTS Score Key: ?70 Need to make dietary changes  40-70 Heart Healthy Diet ? 40 Therapeutic Level Cholesterol Diet    Picture Your Plate Scores: <40 Unhealthy dietary pattern with much room for improvement. 41-50 Dietary pattern unlikely to meet recommendations for good health and room for improvement. 51-60 More healthful dietary pattern, with some room for improvement.  >60 Healthy dietary pattern, although there may be some specific behaviors that could be improved.    Nutrition Goals Re-Evaluation:   Nutrition Goals Re-Evaluation:   Nutrition Goals Discharge (Final Nutrition Goals Re-Evaluation):   Psychosocial: Target Goals: Acknowledge presence or absence of significant depression and/or stress, maximize coping skills, provide positive support system. Participant is able to verbalize types and ability to use techniques and skills needed for reducing stress and depression.  Initial Review & Psychosocial Screening:  Initial Psych Review & Screening - 04/07/23 1524       Initial Review   Current issues with Current Stress Concerns    Source of Stress Concerns Unable to perform yard/household activities;Unable to participate in former interests or hobbies;Occupation    Comments Ohanna denies any feelings of anxiety or depression, however Kristain shared that she has not felt comfortable resuming her hobbies, such as Geophysical data processor and housework, since her heart attack. She is eager to return to these activities, but shared that she is stressed because cannot do them or go to work. She is hoping to return to work next week. Carigan also shared that her job can be stressful at times as well. Support offered, additional resources denied.      Family Dynamics   Good Support System? Yes   Evalyn Casco has her son and daughter for support     Barriers   Psychosocial barriers  to participate in program The patient should benefit from training in stress management and relaxation.      Screening Interventions   Interventions Provide feedback about the scores to participant;Encouraged to exercise;To provide support and resources with identified psychosocial needs    Expected Outcomes Long Term Goal: Stressors or current issues are controlled or eliminated.;Short Term goal: Identification and review with participant of any Quality of Life or Depression concerns found by scoring the questionnaire.;Long Term goal: The participant improves quality of Life and PHQ9 Scores as seen by post scores and/or verbalization of changes             Quality of Life Scores:  Quality of Life - 04/07/23 1527       Quality of Life   Select Quality of Life      Quality of Life Scores   Health/Function Pre 21.75 %    Socioeconomic Pre 21.86 %    Psych/Spiritual Pre 21.71 %    Family Pre 27 %    GLOBAL Pre 22.42 %            Scores of  19 and below usually indicate a poorer quality of life in these areas.  A difference of  2-3 points is a clinically meaningful difference.  A difference of 2-3 points in the total score of the Quality of Life Index has been associated with significant improvement in overall quality of life, self-image, physical symptoms, and general health in studies assessing change in quality of life.  PHQ-9: Review Flowsheet       04/07/2023  Depression screen PHQ 2/9  Decreased Interest 0  Down, Depressed, Hopeless 0  PHQ - 2 Score 0  Altered sleeping 2  Tired, decreased energy 2  Change in appetite 1  Feeling bad or failure about yourself  1  Trouble concentrating 0  Moving slowly or fidgety/restless 0  Suicidal thoughts 0  PHQ-9 Score 6  Difficult doing work/chores --    Details           Interpretation of Total Score  Total Score Depression Severity:  1-4 = Minimal depression, 5-9 = Mild depression, 10-14 = Moderate depression, 15-19 =  Moderately severe depression, 20-27 = Severe depression   Psychosocial Evaluation and Intervention:   Psychosocial Re-Evaluation:   Psychosocial Discharge (Final Psychosocial Re-Evaluation):   Vocational Rehabilitation: Provide vocational rehab assistance to qualifying candidates.   Vocational Rehab Evaluation & Intervention:  Vocational Rehab - 04/07/23 1343       Initial Vocational Rehab Evaluation & Intervention   Assessment shows need for Vocational Rehabilitation No   Linnea is working            Education: Education Goals: Education classes will be provided on a weekly basis, covering required topics. Participant will state understanding/return demonstration of topics presented.     Core Videos: Exercise    Move It!  Clinical staff conducted group or individual video education with verbal and written material and guidebook.  Patient learns the recommended Pritikin exercise program. Exercise with the goal of living a long, healthy life. Some of the health benefits of exercise include controlled diabetes, healthier blood pressure levels, improved cholesterol levels, improved heart and lung capacity, improved sleep, and better body composition. Everyone should speak with their doctor before starting or changing an exercise routine.  Biomechanical Limitations Clinical staff conducted group or individual video education with verbal and written material and guidebook.  Patient learns how biomechanical limitations can impact exercise and how we can mitigate and possibly overcome limitations to have an impactful and balanced exercise routine.  Body Composition Clinical staff conducted group or individual video education with verbal and written material and guidebook.  Patient learns that body composition (ratio of muscle mass to fat mass) is a key component to assessing overall fitness, rather than body weight alone. Increased fat mass, especially visceral belly fat, can put  [place] at increased risk for metabolic syndrome, type 2 diabetes, heart disease, and even death. It is recommended to combine diet and exercise (cardiovascular and resistance training) to improve your body composition. Seek guidance from your physician and exercise physiologist before implementing an exercise routine.  Exercise Action Plan Clinical staff conducted group or individual video education with verbal and written material and guidebook.  Patient learns the recommended strategies to achieve and enjoy long-term exercise adherence, including variety, self-motivation, self-efficacy, and positive decision making. Benefits of exercise include fitness, good health, weight management, more energy, better sleep, less stress, and overall well-being.  Medical   Heart Disease Risk Reduction Clinical staff conducted group or individual video education with verbal and written material and  guidebook.  Patient learns our heart is our most vital organ as it circulates oxygen, nutrients, white blood cells, and hormones throughout the entire body, and carries waste away. Data supports a plant-based eating plan like the Pritikin Program for its effectiveness in slowing progression of and reversing heart disease. The video provides a number of recommendations to address heart disease.   Metabolic Syndrome and Belly Fat  Clinical staff conducted group or individual video education with verbal and written material and guidebook.  Patient learns what metabolic syndrome is, how it leads to heart disease, and how one can reverse it and keep it from coming back. You have metabolic syndrome if you have 3 of the following 5 criteria: abdominal obesity, high blood pressure, high triglycerides, low HDL cholesterol, and high blood sugar.  Hypertension and Heart Disease Clinical staff conducted group or individual video education with verbal and written material and guidebook.  Patient learns that high blood pressure, or  hypertension, is very common in the [place]. Hypertension is largely due to excessive salt intake, but other important risk factors include being overweight, physical inactivity, drinking too much alcohol, smoking, and not eating enough potassium from fruits and vegetables. High blood pressure is a leading risk factor for heart attack, stroke, congestive heart failure, dementia, kidney failure, and premature death. Long-term effects of excessive salt intake include stiffening of the arteries and thickening of heart muscle and organ damage. Recommendations include ways to reduce hypertension and the risk of heart disease.  Diseases of Our Time - Focusing on Diabetes Clinical staff conducted group or individual video education with verbal and written material and guidebook.  Patient learns why the best way to stop diseases of our time is prevention, through food and other lifestyle changes. Medicine (such as prescription pills and surgeries) is often only a Band-Aid on the problem, not a long-term solution. Most common diseases of our time include obesity, type 2 diabetes, hypertension, heart disease, and cancer. The Pritikin Program is recommended and has been proven to help reduce, reverse, and/or prevent the damaging effects of metabolic syndrome.  Nutrition   Overview of the Pritikin Eating Plan  Clinical staff conducted group or individual video education with verbal and written material and guidebook.  Patient learns about the Pritikin Eating Plan for disease risk reduction. The Pritikin Eating Plan emphasizes a wide variety of unrefined, minimally-processed carbohydrates, like fruits, vegetables, whole grains, and legumes. Go, Caution, and Stop food choices are explained. Plant-based and lean animal proteins are emphasized. Rationale provided for low sodium intake for blood pressure control, low added sugars for blood sugar stabilization, and low added fats and oils for coronary artery disease  risk reduction and weight management.  Calorie Density  Clinical staff conducted group or individual video education with verbal and written material and guidebook.  Patient learns about calorie density and how it impacts the Pritikin Eating Plan. Knowing the characteristics of the food you choose will help you decide whether those foods will lead to weight gain or weight loss, and whether you want to consume more or less of them. Weight loss is usually a side effect of the Pritikin Eating Plan because of its focus on low calorie-dense foods.  Label Reading  Clinical staff conducted group or individual video education with verbal and written material and guidebook.  Patient learns about the Pritikin recommended label reading guidelines and corresponding recommendations regarding calorie density, added sugars, sodium content, and whole grains.  Dining Out - Part 1  Clinical  staff conducted group or individual video education with verbal and written material and guidebook.  Patient learns that restaurant meals can be sabotaging because they can be so high in calories, fat, sodium, and/or sugar. Patient learns recommended strategies on how to positively address this and avoid unhealthy pitfalls.  Facts on Fats  Clinical staff conducted group or individual video education with verbal and written material and guidebook.  Patient learns that lifestyle modifications can be just as effective, if not more so, as many medications for lowering your risk of heart disease. A Pritikin lifestyle can help to reduce your risk of inflammation and atherosclerosis (cholesterol build-up, or plaque, in the artery walls). Lifestyle interventions such as dietary choices and physical activity address the cause of atherosclerosis. A review of the types of fats and their impact on blood cholesterol levels, along with dietary recommendations to reduce fat intake is also included.  Nutrition Action Plan  Clinical staff  conducted group or individual video education with verbal and written material and guidebook.  Patient learns how to incorporate Pritikin recommendations into their lifestyle. Recommendations include planning and keeping personal health goals in mind as an important part of their success.  Healthy Mind-Set    Healthy Minds, Bodies, Hearts  Clinical staff conducted group or individual video education with verbal and written material and guidebook.  Patient learns how to identify when they are stressed. Video will discuss the impact of that stress, as well as the many benefits of stress management. Patient will also be introduced to stress management techniques. The way we think, act, and feel has an impact on our hearts.  How Our Thoughts Can Heal Our Hearts  Clinical staff conducted group or individual video education with verbal and written material and guidebook.  Patient learns that negative thoughts can cause depression and anxiety. This can result in negative lifestyle behavior and serious health problems. Cognitive behavioral therapy is an effective method to help control our thoughts in order to change and improve our emotional outlook.  Additional Videos:  Exercise    Improving Performance  Clinical staff conducted group or individual video education with verbal and written material and guidebook.  Patient learns to use a non-linear approach by alternating intensity levels and lengths of time spent exercising to help burn more calories and lose more body fat. Cardiovascular exercise helps improve heart health, metabolism, hormonal balance, blood sugar control, and recovery from fatigue. Resistance training improves strength, endurance, balance, coordination, reaction time, metabolism, and muscle mass. Flexibility exercise improves circulation, posture, and balance. Seek guidance from your physician and exercise physiologist before implementing an exercise routine and learn your capabilities  and proper form for all exercise.  Introduction to Yoga  Clinical staff conducted group or individual video education with verbal and written material and guidebook.  Patient learns about yoga, a discipline of the coming together of mind, breath, and body. The benefits of yoga include improved flexibility, improved range of motion, better posture and core strength, increased lung function, weight loss, and positive self-image. Yoga's heart health benefits include lowered blood pressure, healthier heart rate, decreased cholesterol and triglyceride levels, improved immune function, and reduced stress. Seek guidance from your physician and exercise physiologist before implementing an exercise routine and learn your capabilities and proper form for all exercise.  Medical   Aging: Enhancing Your Quality of Life  Clinical staff conducted group or individual video education with verbal and written material and guidebook.  Patient learns key strategies and recommendations to stay in  good physical health and enhance quality of life, such as prevention strategies, having an advocate, securing a Health Care Proxy and Power of Attorney, and keeping a list of medications and system for tracking them. It also discusses how to avoid risk for bone loss.  Biology of Weight Control  Clinical staff conducted group or individual video education with verbal and written material and guidebook.  Patient learns that weight gain occurs because we consume more calories than we burn (eating more, moving less). Even if your body weight is normal, you may have higher ratios of fat compared to muscle mass. Too much body fat puts you at increased risk for cardiovascular disease, heart attack, stroke, type 2 diabetes, and obesity-related cancers. In addition to exercise, following the Pritikin Eating Plan can help reduce your risk.  Decoding Lab Results  Clinical staff conducted group or individual video education with verbal and  written material and guidebook.  Patient learns that lab test reflects one measurement whose values change over time and are influenced by many factors, including medication, stress, sleep, exercise, food, hydration, pre-existing medical conditions, and more. It is recommended to use the knowledge from this video to become more involved with your lab results and evaluate your numbers to speak with your doctor.   Diseases of Our Time - Overview  Clinical staff conducted group or individual video education with verbal and written material and guidebook.  Patient learns that according to the CDC, 50% to 70% of chronic diseases (such as obesity, type 2 diabetes, elevated lipids, hypertension, and heart disease) are avoidable through lifestyle improvements including healthier food choices, listening to satiety cues, and increased physical activity.  Sleep Disorders Clinical staff conducted group or individual video education with verbal and written material and guidebook.  Patient learns how good quality and duration of sleep are important to overall health and well-being. Patient also learns about sleep disorders and how they impact health along with recommendations to address them, including discussing with a physician.  Nutrition  Dining Out - Part 2 Clinical staff conducted group or individual video education with verbal and written material and guidebook.  Patient learns how to plan ahead and communicate in order to maximize their dining experience in a healthy and nutritious manner. Included are recommended food choices based on the type of restaurant the patient is visiting.   Fueling a Banker conducted group or individual video education with verbal and written material and guidebook.  There is a strong connection between our food choices and our health. Diseases like obesity and type 2 diabetes are very prevalent and are in large-part due to lifestyle choices. The  Pritikin Eating Plan provides plenty of food and hunger-curbing satisfaction. It is easy to follow, affordable, and helps reduce health risks.  Menu Workshop  Clinical staff conducted group or individual video education with verbal and written material and guidebook.  Patient learns that restaurant meals can sabotage health goals because they are often packed with calories, fat, sodium, and sugar. Recommendations include strategies to plan ahead and to communicate with the manager, chef, or server to help order a healthier meal.  Planning Your Eating Strategy  Clinical staff conducted group or individual video education with verbal and written material and guidebook.  Patient learns about the Pritikin Eating Plan and its benefit of reducing the risk of disease. The Pritikin Eating Plan does not focus on calories. Instead, it emphasizes high-quality, nutrient-rich foods. By knowing the characteristics of the foods, we  choose, we can determine their calorie density and make informed decisions.  Targeting Your Nutrition Priorities  Clinical staff conducted group or individual video education with verbal and written material and guidebook.  Patient learns that lifestyle habits have a tremendous impact on disease risk and progression. This video provides eating and physical activity recommendations based on your personal health goals, such as reducing LDL cholesterol, losing weight, preventing or controlling type 2 diabetes, and reducing high blood pressure.  Vitamins and Minerals  Clinical staff conducted group or individual video education with verbal and written material and guidebook.  Patient learns different ways to obtain key vitamins and minerals, including through a recommended healthy diet. It is important to discuss all supplements you take with your doctor.   Healthy Mind-Set    Smoking Cessation  Clinical staff conducted group or individual video education with verbal and written  material and guidebook.  Patient learns that cigarette smoking and tobacco addiction pose a serious health risk which affects millions of people. Stopping smoking will significantly reduce the risk of heart disease, lung disease, and many forms of cancer. Recommended strategies for quitting are covered, including working with your doctor to develop a successful plan.  Culinary   Becoming a Set designer conducted group or individual video education with verbal and written material and guidebook.  Patient learns that cooking at home can be healthy, cost-effective, quick, and puts them in control. Keys to cooking healthy recipes will include looking at your recipe, assessing your equipment needs, planning ahead, making it simple, choosing cost-effective seasonal ingredients, and limiting the use of added fats, salts, and sugars.  Cooking - Breakfast and Snacks  Clinical staff conducted group or individual video education with verbal and written material and guidebook.  Patient learns how important breakfast is to satiety and nutrition through the entire day. Recommendations include key foods to eat during breakfast to help stabilize blood sugar levels and to prevent overeating at meals later in the day. Planning ahead is also a key component.  Cooking - Educational psychologist conducted group or individual video education with verbal and written material and guidebook.  Patient learns eating strategies to improve overall health, including an approach to cook more at home. Recommendations include thinking of animal protein as a side on your plate rather than center stage and focusing instead on lower calorie dense options like vegetables, fruits, whole grains, and plant-based proteins, such as beans. Making sauces in large quantities to freeze for later and leaving the skin on your vegetables are also recommended to maximize your experience.  Cooking - Healthy Salads and  Dressing Clinical staff conducted group or individual video education with verbal and written material and guidebook.  Patient learns that vegetables, fruits, whole grains, and legumes are the foundations of the Pritikin Eating Plan. Recommendations include how to incorporate each of these in flavorful and healthy salads, and how to create homemade salad dressings. Proper handling of ingredients is also covered. Cooking - Soups and [employer] - Soups and Desserts Clinical staff conducted group or individual video education with verbal and written material and guidebook.  Patient learns that Pritikin soups and desserts make for easy, nutritious, and delicious snacks and meal components that are low in sodium, fat, sugar, and calorie density, while high in vitamins, minerals, and filling fiber. Recommendations include simple and healthy ideas for soups and desserts.   Overview     The Pritikin Solution Program Overview Clinical staff  conducted group or individual video education with verbal and written material and guidebook.  Patient learns that the results of the Pritikin Program have been documented in more than 100 articles published in peer-reviewed journals, and the benefits include reducing risk factors for (and, in some cases, even reversing) high cholesterol, high blood pressure, type 2 diabetes, obesity, and more! An overview of the three key pillars of the Pritikin Program will be covered: eating well, doing regular exercise, and having a healthy mind-set.  WORKSHOPS  Exercise: Exercise Basics: Building Your Action Plan Clinical staff led group instruction and group discussion with PowerPoint presentation and patient guidebook. To enhance the learning environment the use of posters, models and videos may be added. At the conclusion of this workshop, patients will comprehend the difference between physical activity and exercise, as well as the benefits of incorporating both, into  their routine. Patients will understand the FITT (Frequency, Intensity, Time, and Type) principle and how to use it to build an exercise action plan. In addition, safety concerns and other considerations for exercise and cardiac rehab will be addressed by the presenter. The purpose of this lesson is to promote a comprehensive and effective weekly exercise routine in order to improve patients' overall level of fitness.   Managing Heart Disease: Your Path to a Healthier Heart Clinical staff led group instruction and group discussion with PowerPoint presentation and patient guidebook. To enhance the learning environment the use of posters, models and videos may be added.At the conclusion of this workshop, patients will understand the anatomy and physiology of the heart. Additionally, they will understand how Pritikin's three pillars impact the risk factors, the progression, and the management of heart disease.  The purpose of this lesson is to provide a high-level overview of the heart, heart disease, and how the Pritikin lifestyle positively impacts risk factors.  Exercise Biomechanics Clinical staff led group instruction and group discussion with PowerPoint presentation and patient guidebook. To enhance the learning environment the use of posters, models and videos may be added. Patients will learn how the structural parts of their bodies function and how these functions impact their daily activities, movement, and exercise. Patients will learn how to promote a neutral spine, learn how to manage pain, and identify ways to improve their physical movement in order to promote healthy living. The purpose of this lesson is to expose patients to common physical limitations that impact physical activity. Participants will learn practical ways to adapt and manage aches and pains, and to minimize their effect on regular exercise. Patients will learn how to maintain good posture while sitting, walking, and  lifting.  Balance Training and Fall Prevention  Clinical staff led group instruction and group discussion with PowerPoint presentation and patient guidebook. To enhance the learning environment the use of posters, models and videos may be added. At the conclusion of this workshop, patients will understand the importance of their sensorimotor skills (vision, proprioception, and the vestibular system) in maintaining their ability to balance as they age. Patients will apply a variety of balancing exercises that are appropriate for their current level of function. Patients will understand the common causes for poor balance, possible solutions to these problems, and ways to modify their physical environment in order to minimize their fall risk. The purpose of this lesson is to teach patients about the importance of maintaining balance as they age and ways to minimize their risk of falling.  WORKSHOPS   Nutrition:  Fueling a Ship broker led group  instruction and group discussion with PowerPoint presentation and patient guidebook. To enhance the learning environment the use of posters, models and videos may be added. Patients will review the foundational principles of the Pritikin Eating Plan and understand what constitutes a serving size in each of the food groups. Patients will also learn Pritikin-friendly foods that are better choices when away from home and review make-ahead meal and snack options. Calorie density will be reviewed and applied to three nutrition priorities: weight maintenance, weight loss, and weight gain. The purpose of this lesson is to reinforce (in a group setting) the key concepts around what patients are recommended to eat and how to apply these guidelines when away from home by planning and selecting Pritikin-friendly options. Patients will understand how calorie density may be adjusted for different weight management goals.  Mindful Eating  Clinical staff led  group instruction and group discussion with PowerPoint presentation and patient guidebook. To enhance the learning environment the use of posters, models and videos may be added. Patients will briefly review the concepts of the Pritikin Eating Plan and the importance of low-calorie dense foods. The concept of mindful eating will be introduced as well as the importance of paying attention to internal hunger signals. Triggers for non-hunger eating and techniques for dealing with triggers will be explored. The purpose of this lesson is to provide patients with the opportunity to review the basic principles of the Pritikin Eating Plan, discuss the value of eating mindfully and how to measure internal cues of hunger and fullness using the Hunger Scale. Patients will also discuss reasons for non-hunger eating and learn strategies to use for controlling emotional eating.  Targeting Your Nutrition Priorities Clinical staff led group instruction and group discussion with PowerPoint presentation and patient guidebook. To enhance the learning environment the use of posters, models and videos may be added. Patients will learn how to determine their genetic susceptibility to disease by reviewing their family history. Patients will gain insight into the importance of diet as part of an overall healthy lifestyle in mitigating the impact of genetics and other environmental insults. The purpose of this lesson is to provide patients with the opportunity to assess their personal nutrition priorities by looking at their family history, their own health history and current risk factors. Patients will also be able to discuss ways of prioritizing and modifying the Pritikin Eating Plan for their highest risk areas  Menu  Clinical staff led group instruction and group discussion with PowerPoint presentation and patient guidebook. To enhance the learning environment the use of posters, models and videos may be added. Using menus  brought in from [employer], or printed from [employer], patients will apply the Pritikin dining out guidelines that were presented in the [employer] video. Patients will also be able to practice these guidelines in a variety of provided scenarios. The purpose of this lesson is to provide patients with the opportunity to practice hands-on learning of the Pritikin Dining Out guidelines with actual menus and practice scenarios.  Label Reading Clinical staff led group instruction and group discussion with PowerPoint presentation and patient guidebook. To enhance the learning environment the use of posters, models and videos may be added. Patients will review and discuss the Pritikin label reading guidelines presented in Pritikin's Label Reading Educational series video. Using fool labels brought in from local grocery stores and markets, patients will apply the label reading guidelines and determine if the packaged food meet the Pritikin guidelines. The purpose of  this lesson is to provide patients with the opportunity to review, discuss, and practice hands-on learning of the Pritikin Label Reading guidelines with actual packaged food labels. Cooking School  Pritikin's [employer] are designed to teach patients ways to prepare quick, simple, and affordable recipes at home. The importance of nutrition's role in chronic disease risk reduction is reflected in its emphasis in the overall Pritikin program. By learning how to prepare essential core Pritikin Eating Plan recipes, patients will increase control over what they eat; be able to customize the flavor of foods without the use of added salt, sugar, or fat; and improve the quality of the food they consume. By learning a set of core recipes which are easily assembled, quickly prepared, and affordable, patients are more likely to prepare more healthy foods at home. These workshops focus on convenient breakfasts, simple  entres, side dishes, and desserts which can be prepared with minimal effort and are consistent with nutrition recommendations for cardiovascular risk reduction. Cooking [employer] are taught by a Armed forces logistics/support/administrative officer (RD) who has been trained by the [employer]. The chef or RD has a clear understanding of the importance of minimizing - if not completely eliminating - added fat, sugar, and sodium in recipes. Throughout the series of Cooking School Workshop sessions, patients will learn about healthy ingredients and efficient methods of cooking to build confidence in their capability to prepare    Cooking School weekly topics:  Adding Flavor- Sodium-Free  Fast and Healthy Breakfasts  Powerhouse Plant-Based Proteins  Satisfying Salads and Dressings  Simple Sides and Sauces  International Cuisine-Spotlight on the [employer] Zones  Delicious Desserts  Savory Soups  [employer] - Meals in a Astronomer Appetizers and Snacks  Comforting Weekend Breakfasts  One-Pot Wonders   Fast Evening Meals  Landscape architect Your Pritikin Plate  WORKSHOPS   Healthy Mindset (Psychosocial):  Focused Goals, Sustainable Changes Clinical staff led group instruction and group discussion with PowerPoint presentation and patient guidebook. To enhance the learning environment the use of posters, models and videos may be added. Patients will be able to apply effective goal setting strategies to establish at least one personal goal, and then take consistent, meaningful action toward that goal. They will learn to identify common barriers to achieving personal goals and develop strategies to overcome them. Patients will also gain an understanding of how our mind-set can impact our ability to achieve goals and the importance of cultivating a positive and growth-oriented mind-set. The purpose of this lesson is to provide patients with a deeper understanding of how to set and  achieve personal goals, as well as the tools and strategies needed to overcome common obstacles which may arise along the way.  From Head to Heart: The Power of a Healthy Outlook  Clinical staff led group instruction and group discussion with PowerPoint presentation and patient guidebook. To enhance the learning environment the use of posters, models and videos may be added. Patients will be able to recognize and describe the impact of emotions and mood on physical health. They will discover the importance of self-care and explore self-care practices which may work for them. Patients will also learn how to utilize the 4 C's to cultivate a healthier outlook and better manage stress and challenges. The purpose of this lesson is to demonstrate to patients how a healthy outlook is an essential part of maintaining good health, especially as they continue their cardiac rehab journey.  Healthy Sleep  for a Healthy Heart Clinical staff led group instruction and group discussion with PowerPoint presentation and patient guidebook. To enhance the learning environment the use of posters, models and videos may be added. At the conclusion of this workshop, patients will be able to demonstrate knowledge of the importance of sleep to overall health, well-being, and quality of life. They will understand the symptoms of, and treatments for, common sleep disorders. Patients will also be able to identify daytime and nighttime behaviors which impact sleep, and they will be able to apply these tools to help manage sleep-related challenges. The purpose of this lesson is to provide patients with a general overview of sleep and outline the importance of quality sleep. Patients will learn about a few of the most common sleep disorders. Patients will also be introduced to the concept of "sleep hygiene," and discover ways to self-manage certain sleeping problems through simple daily behavior changes. Finally, the workshop will motivate  patients by clarifying the links between quality sleep and their goals of heart-healthy living.   Recognizing and Reducing Stress Clinical staff led group instruction and group discussion with PowerPoint presentation and patient guidebook. To enhance the learning environment the use of posters, models and videos may be added. At the conclusion of this workshop, patients will be able to understand the types of stress reactions, differentiate between acute and chronic stress, and recognize the impact that chronic stress has on their health. They will also be able to apply different coping mechanisms, such as reframing negative self-talk. Patients will have the opportunity to practice a variety of stress management techniques, such as deep abdominal breathing, progressive muscle relaxation, and/or guided imagery.  The purpose of this lesson is to educate patients on the role of stress in their lives and to provide healthy techniques for coping with it.  Learning Barriers/Preferences:  Learning Barriers/Preferences - 04/07/23 1354       Learning Barriers/Preferences   Learning Barriers Sight   wears glasses   Learning Preferences Audio;Group Instruction;Individual Instruction;Pictoral;Skilled Demonstration;Verbal Instruction;Video;Written Material             Education Topics:  Knowledge Questionnaire Score:  Knowledge Questionnaire Score - 04/07/23 1352       Knowledge Questionnaire Score   Pre Score 18/24             Core Components/Risk Factors/Patient Goals at Admission:  Personal Goals and Risk Factors at Admission - 04/07/23 1352       Core Components/Risk Factors/Patient Goals on Admission    Weight Management Yes;Weight Loss    Intervention Weight Management: Develop a combined nutrition and exercise program designed to reach desired caloric intake, while maintaining appropriate intake of nutrient and fiber, sodium and fats, and appropriate energy expenditure required for  the weight goal.;Weight Management: Provide education and appropriate resources to help participant work on and attain dietary goals.    Expected Outcomes Short Term: Continue to assess and modify interventions until short term weight is achieved;Long Term: Adherence to nutrition and physical activity/exercise program aimed toward attainment of established weight goal;Weight Loss: Understanding of general recommendations for a balanced deficit meal plan, which promotes 1-2 lb weight loss per week and includes a negative energy balance of [phone] kcal/d;Understanding recommendations for meals to include 15-35% energy as protein, 25-35% energy from fat, 35-60% energy from carbohydrates, less than 200mg  of dietary cholesterol, 20-35 gm of total fiber daily;Understanding of distribution of calorie intake throughout the day with the consumption of 4-5 meals/snacks    Diabetes Yes  Intervention Provide education about signs/symptoms and action to take for hypo/hyperglycemia.;Provide education about proper nutrition, including hydration, and aerobic/resistive exercise prescription along with prescribed medications to achieve blood glucose in normal ranges: Fasting glucose 65-99 mg/dL    Expected Outcomes Short Term: Participant verbalizes understanding of the signs/symptoms and immediate care of hyper/hypoglycemia, proper foot care and importance of medication, aerobic/resistive exercise and nutrition plan for blood glucose control.;Long Term: Attainment of HbA1C < 7%.    Hypertension Yes    Intervention Provide education on lifestyle modifcations including regular physical activity/exercise, weight management, moderate sodium restriction and increased consumption of fresh fruit, vegetables, and low fat dairy, alcohol moderation, and smoking cessation.;Monitor prescription use compliance.    Expected Outcomes Short Term: Continued assessment and intervention until BP is < 140/2mm HG in hypertensive participants.  < 130/44mm HG in hypertensive participants with diabetes, heart failure or chronic kidney disease.;Long Term: Maintenance of blood pressure at goal levels.    Lipids Yes    Intervention Provide education and support for participant on nutrition & aerobic/resistive exercise along with prescribed medications to achieve LDL 70mg , HDL >40mg .    Expected Outcomes Short Term: Participant states understanding of desired cholesterol values and is compliant with medications prescribed. Participant is following exercise prescription and nutrition guidelines.;Long Term: Cholesterol controlled with medications as prescribed, with individualized exercise RX and with personalized nutrition plan. Value goals: LDL < 70mg , HDL > 40 mg.    Stress Yes    Intervention Offer individual and/or small group education and counseling on adjustment to heart disease, stress management and health-related lifestyle change. Teach and support self-help strategies.;Refer participants experiencing significant psychosocial distress to appropriate mental health specialists for further evaluation and treatment. When possible, include family members and significant others in education/counseling sessions.    Expected Outcomes Short Term: Participant demonstrates changes in health-related behavior, relaxation and other stress management skills, ability to obtain effective social support, and compliance with psychotropic medications if prescribed.;Long Term: Emotional wellbeing is indicated by absence of clinically significant psychosocial distress or social isolation.             Core Components/Risk Factors/Patient Goals Review:    Core Components/Risk Factors/Patient Goals at Discharge (Final Review):    ITP Comments:  ITP Comments     Row Name 04/07/23 1327           ITP Comments Rondell Reams, MD: Medical Director. Introduction to the [facility] / Intensive Cardiac Rehab.  Initial orientation packet  reviewed with the patient today.                Comments: Participant attended orientation for the cardiac rehabilitation program on  04/07/2023  to perform initial intake and exercise walk test. Patient introduced to the [facility] education and orientation packet was reviewed. Completed 6-minute walk test, measurements, initial ITP, and exercise prescription. Vital signs stable. Telemetry-normal sinus rhythm, mildly symptomatic. During minute 2:30 of pt stated she was lightheaded, pt sat in chair without issue. BP 132/72, SpO2 97%, Telemetry 110 NSR, RPD = 1. RN Mary notifed, CBG checked = 132. Pt given H2O seated until minute 4:00, lightheadedness and SOB resolved, pt resumed walking. No other c/o during orientation.    Service time was from 1315 to 1505.  Jonna Coup, MS, ACSM-CEP 04/07/2023 3:29 PM

## 2023-04-07 NOTE — Progress Notes (Signed)
Cardiac Rehab Medication Review   Does the patient  feel that his/her medications are working for him/her?  YES   Has the patient been experiencing any side effects to the medications prescribed?  NO  Does the patient measure his/her own blood pressure or blood glucose at home?  YES   Does the patient have any problems obtaining medications due to transportation or finances?   NO  Understanding of regimen: excellent Understanding of indications: excellent Potential of compliance: excellent    Comments: Joye understands her medications and regime. She checks her CBGs daily in the morning and BP's throughout the day. Leathia had issues getting her medications at [facility] so she just recently switched pharmacy's.     Jonna Coup, MS, ACSM-CEP 04/07/2023 1:43 PM

## 2023-04-11 ENCOUNTER — Encounter (HOSPITAL_COMMUNITY): Payer: PPO

## 2023-04-13 ENCOUNTER — Encounter (HOSPITAL_COMMUNITY)
Admission: RE | Admit: 2023-04-13 | Discharge: 2023-04-13 | Disposition: A | Discharge status: Home / Self Care | Payer: PPO | Source: Ambulatory Visit | Attending: Cardiovascular Disease | Admitting: Cardiovascular Disease

## 2023-04-13 ENCOUNTER — Encounter (HOSPITAL_COMMUNITY): Payer: PPO

## 2023-04-13 DIAGNOSIS — I214 Non-ST elevation (NSTEMI) myocardial infarction: Secondary | ICD-10-CM | POA: Diagnosis not present

## 2023-04-13 DIAGNOSIS — Z955 Presence of coronary angioplasty implant and graft: Secondary | ICD-10-CM

## 2023-04-13 LAB — GLUCOSE, CAPILLARY
Glucose-Capillary: 209 mg/dL — ABNORMAL HIGH (ref 70–99)
Glucose-Capillary: 169 mg/dL — ABNORMAL HIGH (ref 70–99)

## 2023-04-13 NOTE — Progress Notes (Signed)
Cardiac Individual Treatment Plan  Patient Details  Name: Suzanne Sutton MRN: [id number] Date of Birth: Jan 28, 1946 Referring Provider:   Flowsheet Row INTENSIVE CARDIAC REHAB ORIENT from 04/07/2023 in [facility]  Referring Provider Nanetta Batty, MD       Initial Encounter Date:  Flowsheet Row INTENSIVE CARDIAC REHAB ORIENT from 04/07/2023 in [facility] for Heart, Vascular, [facility] Lung [facility]  Date 04/07/23       Visit Diagnosis: NSTEMI (non-ST elevated myocardial infarction) (HCC)  S/P drug eluting coronary stent placement  Patient's Home Medications on Admission:  Current Outpatient Medications:    aspirin EC 81 MG tablet, Take 1 tablet (81 mg total) by mouth daily. Swallow whole., Disp: 120 tablet, Rfl: 3   atorvastatin (LIPITOR) 40 MG tablet, Take 1 tablet (40 mg total) by mouth daily., Disp: 30 tablet, Rfl: 3   azelastine (ASTELIN) 137 MCG/SPRAY nasal spray, Place 1 spray into the nose daily as needed for allergies (post nasal drip). Use in each nostril as directed, Disp: , Rfl:    benazepril-hydrochlorthiazide (LOTENSIN HCT) 20-25 MG per tablet, Take 1 tablet by mouth daily., Disp: , Rfl:    Beta Carotene (VITAMIN A) 25000 UNIT capsule, Take 25,000 Units by mouth daily., Disp: , Rfl:    clonazePAM (KLONOPIN) 0.5 MG tablet, Take 0.25 mg by mouth at bedtime as needed for anxiety (sleep)., Disp: , Rfl:    glucose blood (FREESTYLE TEST STRIPS) test strip, USE AS INSTRUCTED TO CHECK BLOOD SUGAR ONCE DAILY DX CODE E11.9, Disp: 50 each, Rfl: 0   levothyroxine (SYNTHROID, LEVOTHROID) 50 MCG tablet, TAKE 1 TABLET (50 MCG TOTAL) BY MOUTH DAILY BEFORE BREAKFAST., Disp: 30 tablet, Rfl: 5   metFORMIN (GLUCOPHAGE) 500 MG tablet, Take 500-1,000 mg by mouth See admin instructions. 500 mg at lunch, 1000 mg with dinner., Disp: , Rfl:    metoprolol succinate (TOPROL-XL) 25 MG 24 hr tablet, Take 1 tablet (25 mg total)  by mouth daily., Disp: 30 tablet, Rfl: 3   nitroGLYCERIN (NITROSTAT) 0.4 MG SL tablet, Place 1 tablet (0.4 mg total) under the tongue every 5 (five) minutes x 3 doses as needed for chest pain., Disp: 25 tablet, Rfl: 0   potassium chloride SA (K-DUR,KLOR-CON) 20 MEQ tablet, Take 1 tablet (20 mEq total) by mouth daily. (Patient taking differently: Take 20 mEq by mouth daily with lunch.), Disp: 30 tablet, Rfl: 3   PROAIR HFA 108 (90 BASE) MCG/ACT inhaler, INHALE 1 TO 2 PUFFS BY MOUTH EVERY 6 HOURS AS NEEDED, Disp: , Rfl: 1   Study - LIBREXIA-ACS - milvexian 25 mg or placebo tablet (PI-Stuckey), Take 1 tablet by mouth 2 (two) times daily. Take with or without food. Bring bottle back to every visit. For Investigational Use Only. Please contact [facility] for any questions or concerns regarding this medication., Disp: 70 tablet, Rfl: 0   ticagrelor (BRILINTA) 90 MG TABS tablet, Take 1 tablet (90 mg total) by mouth 2 (two) times daily., Disp: 60 tablet, Rfl: 3  Past Medical History: Past Medical History:  Diagnosis Date   Anxiety 11/16/2013   Hyperlipidemia 05/09/2013   Hypertension 01/27/2011   Hypokalemia 11/16/2013   Hypothyroidism 05/11/2013   Seasonal allergies 01/27/2011   Uterine cancer (HCC)     Tobacco Use: Social History   Tobacco Use  Smoking Status Never  Smokeless Tobacco Never    Labs: Review Flowsheet  More data exists  Latest Ref Rng & Units 05/15/2014 11/08/2014 05/28/2015 03/21/2023 03/22/2023  Labs for ITP Cardiac and Pulmonary Rehab  Cholestrol 0 - 200 mg/dL 629  - - - 528   LDL (calc) 0 - 99 mg/dL 84  - - - 93   HDL-C >41 mg/dL 32.44  - - - 59   Trlycerides <150 mg/dL 010.2  - - - 87   Hemoglobin A1c 4.8 - 5.6 % 6.6  6.6  6.5  7.0  -    Details            Capillary Blood Glucose: Lab Results  Component Value Date   GLUCAP 169 (H) 04/13/2023   GLUCAP 209 (H) 04/13/2023   GLUCAP 134 (H) 04/07/2023   GLUCAP 171 (H) 03/24/2023   GLUCAP 224  (H) 03/23/2023     Exercise Target Goals: Exercise Program Goal: Individual exercise prescription set using results from initial 6 min walk test and THRR while considering  patient's activity barriers and safety.   Exercise Prescription Goal: Initial exercise prescription builds to 30-45 minutes a day of aerobic activity, 2-3 days per week.  Home exercise guidelines will be given to patient during program as part of exercise prescription that the participant will acknowledge.  Activity Barriers & Risk Stratification:  Activity Barriers & Cardiac Risk Stratification - 04/07/23 1519       Activity Barriers & Cardiac Risk Stratification   Activity Barriers Deconditioning;Shortness of Breath;Neck/Spine Problems;Assistive Device;Other (comment)    Comments dizziness    Cardiac Risk Stratification High   <5 METs on            6 Minute Walk:  6 Minute Walk     Row Name 04/07/23 1517         6 Minute Walk   Phase Initial     Distance 480 feet     Walk Time 6 minutes     # of Rest Breaks 1  2:30-4:00 3/10 lightheadedness, resolved and pt continued. see note in ITP for details     MPH 0.9     METS 1.21     RPE 13     Perceived Dyspnea  1     VO2 Peak 4.23     Symptoms Yes (comment)     Comments 3/10 lightheadedness. VSS, resolved with seated rest break. RPD = 1, pt has allergy cough and resolved with rest     Resting HR 96 bpm     Resting BP 108/68     Resting Oxygen Saturation  100 %     Exercise Oxygen Saturation  during 6 min walk 98 %     Max Ex. HR 112 bpm     Max Ex. BP 126/66     2 Minute Post BP 114/68              6 Minute Walk     Row Name 04/07/23 1517         6 Minute Walk   Phase Initial     Distance 480 feet     Walk Time 6 minutes     # of Rest Breaks 1  2:30-4:00 3/10 lightheadedness, resolved and pt continued. see note in ITP for details     MPH 0.9     METS 1.21     RPE 13     Perceived Dyspnea  1     VO2 Peak 4.23     Symptoms  Yes (comment)     Comments 3/10  lightheadedness. VSS, resolved with seated rest break. RPD = 1, pt has allergy cough and resolved with rest     Resting HR 96 bpm     Resting BP 108/68     Resting Oxygen Saturation  100 %     Exercise Oxygen Saturation  during 6 min walk 98 %     Max Ex. HR 112 bpm     Max Ex. BP 126/66     2 Minute Post BP 114/68              Oxygen Initial Assessment:   Oxygen Re-Evaluation:   Oxygen Discharge (Final Oxygen Re-Evaluation):   Initial Exercise Prescription:  Initial Exercise Prescription - 04/07/23 1500       Date of Initial Exercise RX and Referring Provider   Date 04/07/23    Referring Provider Nanetta Batty, MD    Expected Discharge Date 06/29/23      T5 Nustep   Level 1    SPM 50    Minutes 30    METs 1.5      Prescription Details   Frequency (times per week) 3    Duration Progress to 30 minutes of continuous aerobic without signs/symptoms of physical distress      Intensity   THRR 40-80% of Max Heartrate 57-114    Ratings of Perceived Exertion 11-13    Perceived Dyspnea 0-4      Progression   Progression Continue progressive overload as per policy without signs/symptoms or physical distress.      Resistance Training   Training Prescription Yes    Weight 2    Reps 10-15             Perform Capillary Blood Glucose checks as needed.  Exercise Prescription Changes:   Exercise Prescription Changes     Row Name 04/13/23 1644             Response to Exercise   Blood Pressure (Admit) 104/58       Blood Pressure (Exercise) 120/60       Blood Pressure (Exit) 102/60       Heart Rate (Admit) 88 bpm       Heart Rate (Exercise) 91 bpm       Heart Rate (Exit) 81 bpm       Rating of Perceived Exertion (Exercise) 9       Perceived Dyspnea (Exercise) 0       Symptoms none       Comments Pt first day in the [facility] program       Duration Progress to 30 minutes of  aerobic without signs/symptoms of physical  distress       Intensity THRR unchanged         Progression   Progression Continue to progress workloads to maintain intensity without signs/symptoms of physical distress.       Average METs 1.3         Resistance Training   Training Prescription No         T5 Nustep   Level 1       SPM 25       Minutes 30       METs 1.3                Exercise Comments:   Exercise Comments     Row Name 04/13/23 1649           Exercise Comments Pt first day in the [employer].  Pt tolerated low intensity exercise well with an average MET level of 1.3. She is learning her THRR, RPE and ExRx. She is off to a good start. Will continue to increase workloads as tolerated without sign or symtpom                Exercise Goals and Review:   Exercise Goals     Row Name 04/07/23 1342             Exercise Goals   Increase Physical Activity Yes       Intervention Provide advice, education, support and counseling about physical activity/exercise needs.;Develop an individualized exercise prescription for aerobic and resistive training based on initial evaluation findings, risk stratification, comorbidities and participant's personal goals.       Expected Outcomes Short Term: Attend rehab on a regular basis to increase amount of physical activity.;Long Term: Exercising regularly at least 3-5 days a week.;Long Term: Add in home exercise to make exercise part of routine and to increase amount of physical activity.       Increase Strength and Stamina Yes       Intervention Provide advice, education, support and counseling about physical activity/exercise needs.;Develop an individualized exercise prescription for aerobic and resistive training based on initial evaluation findings, risk stratification, comorbidities and participant's personal goals.       Expected Outcomes Short Term: Increase workloads from initial exercise prescription for resistance, speed, and METs.;Short Term: Perform  resistance training exercises routinely during rehab and add in resistance training at home;Long Term: Improve cardiorespiratory fitness, muscular endurance and strength as measured by increased METs and functional capacity ( )       Able to understand and use rate of perceived exertion (RPE) scale Yes       Intervention Provide education and explanation on how to use RPE scale       Expected Outcomes Short Term: Able to use RPE daily in rehab to express subjective intensity level;Long Term:  Able to use RPE to guide intensity level when exercising independently       Knowledge and understanding of Target Heart Rate Range (THRR) Yes       Intervention Provide education and explanation of THRR including how the numbers were predicted and where they are located for reference       Expected Outcomes Short Term: Able to state/look up THRR;Short Term: Able to use daily as guideline for intensity in rehab;Long Term: Able to use THRR to govern intensity when exercising independently       Understanding of Exercise Prescription Yes       Intervention Provide education, explanation, and written materials on patient's individual exercise prescription       Expected Outcomes Short Term: Able to explain program exercise prescription;Long Term: Able to explain home exercise prescription to exercise independently                Exercise Goals     Row Name 04/07/23 1342             Exercise Goals   Increase Physical Activity Yes       Intervention Provide advice, education, support and counseling about physical activity/exercise needs.;Develop an individualized exercise prescription for aerobic and resistive training based on initial evaluation findings, risk stratification, comorbidities and participant's personal goals.       Expected Outcomes Short Term: Attend rehab on a regular basis to increase amount of physical activity.;Long Term: Exercising regularly at least 3-5 days a week.;Long Term: Add  in home exercise to make exercise part of routine and to increase amount of physical activity.       Increase Strength and Stamina Yes       Intervention Provide advice, education, support and counseling about physical activity/exercise needs.;Develop an individualized exercise prescription for aerobic and resistive training based on initial evaluation findings, risk stratification, comorbidities and participant's personal goals.       Expected Outcomes Short Term: Increase workloads from initial exercise prescription for resistance, speed, and METs.;Short Term: Perform resistance training exercises routinely during rehab and add in resistance training at home;Long Term: Improve cardiorespiratory fitness, muscular endurance and strength as measured by increased METs and functional capacity ( )       Able to understand and use rate of perceived exertion (RPE) scale Yes       Intervention Provide education and explanation on how to use RPE scale       Expected Outcomes Short Term: Able to use RPE daily in rehab to express subjective intensity level;Long Term:  Able to use RPE to guide intensity level when exercising independently       Knowledge and understanding of Target Heart Rate Range (THRR) Yes       Intervention Provide education and explanation of THRR including how the numbers were predicted and where they are located for reference       Expected Outcomes Short Term: Able to state/look up THRR;Short Term: Able to use daily as guideline for intensity in rehab;Long Term: Able to use THRR to govern intensity when exercising independently       Understanding of Exercise Prescription Yes       Intervention Provide education, explanation, and written materials on patient's individual exercise prescription       Expected Outcomes Short Term: Able to explain program exercise prescription;Long Term: Able to explain home exercise prescription to exercise independently                Exercise  Goals Re-Evaluation :  Exercise Goals Re-Evaluation     Row Name 04/13/23 1646             Exercise Goal Re-Evaluation   Exercise Goals Review Increase Physical Activity;Understanding of Exercise Prescription;Increase Strength and Stamina;Knowledge and understanding of Target Heart Rate Range (THRR);Able to understand and use rate of perceived exertion (RPE) scale       Comments Pt first day in the [facility] program. Pt tolerated low intensity exercise well with an average MET level of 1.3. She is learning her THRR, RPE and ExRx. She is off to a good start.       Expected Outcomes Will continue to increase workloads as tolerated without sign or symtpom                Discharge Exercise Prescription (Final Exercise Prescription Changes):  Exercise Prescription Changes - 04/13/23 1644       Response to Exercise   Blood Pressure (Admit) 104/58    Blood Pressure (Exercise) 120/60    Blood Pressure (Exit) 102/60    Heart Rate (Admit) 88 bpm    Heart Rate (Exercise) 91 bpm    Heart Rate (Exit) 81 bpm    Rating of Perceived Exertion (Exercise) 9    Perceived Dyspnea (Exercise) 0    Symptoms none    Comments Pt first day in the [facility] program    Duration Progress to 30 minutes of  aerobic without signs/symptoms of physical distress    Intensity THRR unchanged  Progression   Progression Continue to progress workloads to maintain intensity without signs/symptoms of physical distress.    Average METs 1.3      Resistance Training   Training Prescription No      T5 Nustep   Level 1    SPM 25    Minutes 30    METs 1.3             Nutrition:  Target Goals: Understanding of nutrition guidelines, daily intake of sodium 1500mg , cholesterol 200mg , calories 30% from fat and 7% or less from saturated fats, daily to have 5 or more servings of fruits and vegetables.  Biometrics:  Pre Biometrics - 04/07/23 1328       Pre Biometrics   Waist Circumference 42  inches    Hip Circumference 41 inches    Waist to Hip Ratio 1.02 %    Triceps Skinfold 27 mm    % Body Fat 41.1 %    Grip Strength 18 kg    Flexibility 0 in   not done due to back issues   Single Leg Stand 8.87 seconds              Nutrition Therapy Plan and Nutrition Goals:   Nutrition Assessments:  MEDIFICTS Score Key: ?70 Need to make dietary changes  40-70 Heart Healthy Diet ? 40 Therapeutic Level Cholesterol Diet    Picture Your Plate Scores: <40 Unhealthy dietary pattern with much room for improvement. 41-50 Dietary pattern unlikely to meet recommendations for good health and room for improvement. 51-60 More healthful dietary pattern, with some room for improvement.  >60 Healthy dietary pattern, although there may be some specific behaviors that could be improved.    Nutrition Goals Re-Evaluation:   Nutrition Goals Re-Evaluation:   Nutrition Goals Discharge (Final Nutrition Goals Re-Evaluation):   Psychosocial: Target Goals: Acknowledge presence or absence of significant depression and/or stress, maximize coping skills, provide positive support system. Participant is able to verbalize types and ability to use techniques and skills needed for reducing stress and depression.  Initial Review & Psychosocial Screening:  Initial Psych Review & Screening - 04/07/23 1524       Initial Review   Current issues with Current Stress Concerns    Source of Stress Concerns Unable to perform yard/household activities;Unable to participate in former interests or hobbies;Occupation    Comments Ahaana denies any feelings of anxiety or depression, however Rane shared that she has not felt comfortable resuming her hobbies, such as Geophysical data processor and housework, since her heart attack. She is eager to return to these activities, but shared that she is stressed because cannot do them or go to work. She is hoping to return to work next week. Tippi also shared that her job can be stressful at  times as well. Support offered, additional resources denied.      Family Dynamics   Good Support System? Yes   Evalyn Casco has her son and daughter for support     Barriers   Psychosocial barriers to participate in program The patient should benefit from training in stress management and relaxation.      Screening Interventions   Interventions Provide feedback about the scores to participant;Encouraged to exercise;To provide support and resources with identified psychosocial needs    Expected Outcomes Long Term Goal: Stressors or current issues are controlled or eliminated.;Short Term goal: Identification and review with participant of any Quality of Life or Depression concerns found by scoring the questionnaire.;Long Term goal: The participant improves quality  of Life and PHQ9 Scores as seen by post scores and/or verbalization of changes             Quality of Life Scores:  Quality of Life - 04/07/23 1527       Quality of Life   Select Quality of Life      Quality of Life Scores   Health/Function Pre 21.75 %    Socioeconomic Pre 21.86 %    Psych/Spiritual Pre 21.71 %    Family Pre 27 %    GLOBAL Pre 22.42 %            Scores of 19 and below usually indicate a poorer quality of life in these areas.  A difference of  2-3 points is a clinically meaningful difference.  A difference of 2-3 points in the total score of the Quality of Life Index has been associated with significant improvement in overall quality of life, self-image, physical symptoms, and general health in studies assessing change in quality of life.  PHQ-9: Review Flowsheet       04/07/2023  Depression screen PHQ 2/9  Decreased Interest 0  Down, Depressed, Hopeless 0  PHQ - 2 Score 0  Altered sleeping 2  Tired, decreased energy 2  Change in appetite 1  Feeling bad or failure about yourself  1  Trouble concentrating 0  Moving slowly or fidgety/restless 0  Suicidal thoughts 0  PHQ-9 Score 6  Difficult doing  work/chores --    Details           Interpretation of Total Score  Total Score Depression Severity:  1-4 = Minimal depression, 5-9 = Mild depression, 10-14 = Moderate depression, 15-19 = Moderately severe depression, 20-27 = Severe depression   Psychosocial Evaluation and Intervention:   Psychosocial Re-Evaluation:  Psychosocial Re-Evaluation     Row Name 04/13/23 1648             Psychosocial Re-Evaluation   Current issues with Current Stress Concerns       Comments Zaiden says she still feel weak due her recent cardiac procedure. Emotional support provided       Expected Outcomes Shantella will have decreased or controlled stress upon completion of cardiac rehab       Interventions Encouraged to attend Cardiac Rehabilitation for the exercise;Stress management education;Relaxation education       Continue Psychosocial Services  Follow up required by staff         Initial Review   Source of Stress Concerns Unable to participate in former interests or hobbies;Unable to perform yard/household activities;Chronic Illness;Occupation       Comments will continue to monitor and offer support as needed.                Psychosocial Discharge (Final Psychosocial Re-Evaluation):  Psychosocial Re-Evaluation - 04/13/23 1648       Psychosocial Re-Evaluation   Current issues with Current Stress Concerns    Comments Katiya says she still feel weak due her recent cardiac procedure. Emotional support provided    Expected Outcomes Maysam will have decreased or controlled stress upon completion of cardiac rehab    Interventions Encouraged to attend Cardiac Rehabilitation for the exercise;Stress management education;Relaxation education    Continue Psychosocial Services  Follow up required by staff      Initial Review   Source of Stress Concerns Unable to participate in former interests or hobbies;Unable to perform yard/household activities;Chronic Illness;Occupation    Comments will continue  to monitor and offer  support as needed.             Vocational Rehabilitation: Provide vocational rehab assistance to qualifying candidates.   Vocational Rehab Evaluation & Intervention:  Vocational Rehab - 04/07/23 1343       Initial Vocational Rehab Evaluation & Intervention   Assessment shows need for Vocational Rehabilitation No   Hensley is working            Education: Education Goals: Education classes will be provided on a weekly basis, covering required topics. Participant will state understanding/return demonstration of topics presented.    Education     Row Name 04/13/23 1600     Education   Cardiac Education Topics [facility]   Customer service manager   Weekly Topic [employer]- Spotlight on the [employer] Zones   Instruction Review Code 1- Verbalizes Understanding   Class Start Time 1400   Class Stop Time 1448   Class Time Calculation (min) 48 min            Education     Row Name 04/13/23 1600     Education   Cardiac Education Topics [facility]   Customer service manager   Weekly Topic [employer]isine- Spotlight on the [employer] Zones   Instruction Review Code 1- Verbalizes Understanding   Class Start Time 1400   Class Stop Time 1448   Class Time Calculation (min) 48 min            Core Videos: Exercise    Move It!  Clinical staff conducted group or individual video education with verbal and written material and guidebook.  Patient learns the recommended [facility] exercise program. Exercise with the goal of living a long, healthy life. Some of the health benefits of exercise include controlled diabetes, healthier blood pressure levels, improved cholesterol levels, improved heart and lung capacity, improved sleep, and better body composition. Everyone should speak with their doctor before starting or changing an exercise routine.  Biomechanical  Limitations Clinical staff conducted group or individual video education with verbal and written material and guidebook.  Patient learns how biomechanical limitations can impact exercise and how we can mitigate and possibly overcome limitations to have an impactful and balanced exercise routine.  Body Composition Clinical staff conducted group or individual video education with verbal and written material and guidebook.  Patient learns that body composition (ratio of muscle mass to fat mass) is a key component to assessing overall fitness, rather than body weight alone. Increased fat mass, especially visceral belly fat, can put [place] at increased risk for metabolic syndrome, type 2 diabetes, heart disease, and even death. It is recommended to combine diet and exercise (cardiovascular and resistance training) to improve your body composition. Seek guidance from your physician and exercise physiologist before implementing an exercise routine.  Exercise Action Plan Clinical staff conducted group or individual video education with verbal and written material and guidebook.  Patient learns the recommended strategies to achieve and enjoy long-term exercise adherence, including variety, self-motivation, self-efficacy, and positive decision making. Benefits of exercise include fitness, good health, weight management, more energy, better sleep, less stress, and overall well-being.  Medical   Heart Disease Risk Reduction Clinical staff conducted group or individual video education with verbal and written material and guidebook.  Patient learns our heart is our most vital organ as it circulates oxygen, nutrients, white blood cells, and hormones throughout the entire  body, and carries waste away. Data supports a plant-based eating plan like the Pritikin Program for its effectiveness in slowing progression of and reversing heart disease. The video provides a number of recommendations to address heart  disease.   Metabolic Syndrome and Belly Fat  Clinical staff conducted group or individual video education with verbal and written material and guidebook.  Patient learns what metabolic syndrome is, how it leads to heart disease, and how one can reverse it and keep it from coming back. You have metabolic syndrome if you have 3 of the following 5 criteria: abdominal obesity, high blood pressure, high triglycerides, low HDL cholesterol, and high blood sugar.  Hypertension and Heart Disease Clinical staff conducted group or individual video education with verbal and written material and guidebook.  Patient learns that high blood pressure, or hypertension, is very common in the [place]. Hypertension is largely due to excessive salt intake, but other important risk factors include being overweight, physical inactivity, drinking too much alcohol, smoking, and not eating enough potassium from fruits and vegetables. High blood pressure is a leading risk factor for heart attack, stroke, congestive heart failure, dementia, kidney failure, and premature death. Long-term effects of excessive salt intake include stiffening of the arteries and thickening of heart muscle and organ damage. Recommendations include ways to reduce hypertension and the risk of heart disease.  Diseases of Our Time - Focusing on Diabetes Clinical staff conducted group or individual video education with verbal and written material and guidebook.  Patient learns why the best way to stop diseases of our time is prevention, through food and other lifestyle changes. Medicine (such as prescription pills and surgeries) is often only a Band-Aid on the problem, not a long-term solution. Most common diseases of our time include obesity, type 2 diabetes, hypertension, heart disease, and cancer. The Pritikin Program is recommended and has been proven to help reduce, reverse, and/or prevent the damaging effects of metabolic syndrome.  Nutrition    Overview of the Pritikin Eating Plan  Clinical staff conducted group or individual video education with verbal and written material and guidebook.  Patient learns about the Pritikin Eating Plan for disease risk reduction. The Pritikin Eating Plan emphasizes a wide variety of unrefined, minimally-processed carbohydrates, like fruits, vegetables, whole grains, and legumes. Go, Caution, and Stop food choices are explained. Plant-based and lean animal proteins are emphasized. Rationale provided for low sodium intake for blood pressure control, low added sugars for blood sugar stabilization, and low added fats and oils for coronary artery disease risk reduction and weight management.  Calorie Density  Clinical staff conducted group or individual video education with verbal and written material and guidebook.  Patient learns about calorie density and how it impacts the Pritikin Eating Plan. Knowing the characteristics of the food you choose will help you decide whether those foods will lead to weight gain or weight loss, and whether you want to consume more or less of them. Weight loss is usually a side effect of the Pritikin Eating Plan because of its focus on low calorie-dense foods.  Label Reading  Clinical staff conducted group or individual video education with verbal and written material and guidebook.  Patient learns about the Pritikin recommended label reading guidelines and corresponding recommendations regarding calorie density, added sugars, sodium content, and whole grains.  Dining Out - Part 1  Clinical staff conducted group or individual video education with verbal and written material and guidebook.  Patient learns that restaurant meals can be sabotaging because  they can be so high in calories, fat, sodium, and/or sugar. Patient learns recommended strategies on how to positively address this and avoid unhealthy pitfalls.  Facts on Fats  Clinical staff conducted group or individual video  education with verbal and written material and guidebook.  Patient learns that lifestyle modifications can be just as effective, if not more so, as many medications for lowering your risk of heart disease. A Pritikin lifestyle can help to reduce your risk of inflammation and atherosclerosis (cholesterol build-up, or plaque, in the artery walls). Lifestyle interventions such as dietary choices and physical activity address the cause of atherosclerosis. A review of the types of fats and their impact on blood cholesterol levels, along with dietary recommendations to reduce fat intake is also included.  Nutrition Action Plan  Clinical staff conducted group or individual video education with verbal and written material and guidebook.  Patient learns how to incorporate Pritikin recommendations into their lifestyle. Recommendations include planning and keeping personal health goals in mind as an important part of their success.  Healthy Mind-Set    Healthy Minds, Bodies, Hearts  Clinical staff conducted group or individual video education with verbal and written material and guidebook.  Patient learns how to identify when they are stressed. Video will discuss the impact of that stress, as well as the many benefits of stress management. Patient will also be introduced to stress management techniques. The way we think, act, and feel has an impact on our hearts.  How Our Thoughts Can Heal Our Hearts  Clinical staff conducted group or individual video education with verbal and written material and guidebook.  Patient learns that negative thoughts can cause depression and anxiety. This can result in negative lifestyle behavior and serious health problems. Cognitive behavioral therapy is an effective method to help control our thoughts in order to change and improve our emotional outlook.  Additional Videos:  Exercise    Improving Performance  Clinical staff conducted group or individual video education with  verbal and written material and guidebook.  Patient learns to use a non-linear approach by alternating intensity levels and lengths of time spent exercising to help burn more calories and lose more body fat. Cardiovascular exercise helps improve heart health, metabolism, hormonal balance, blood sugar control, and recovery from fatigue. Resistance training improves strength, endurance, balance, coordination, reaction time, metabolism, and muscle mass. Flexibility exercise improves circulation, posture, and balance. Seek guidance from your physician and exercise physiologist before implementing an exercise routine and learn your capabilities and proper form for all exercise.  Introduction to Yoga  Clinical staff conducted group or individual video education with verbal and written material and guidebook.  Patient learns about yoga, a discipline of the coming together of mind, breath, and body. The benefits of yoga include improved flexibility, improved range of motion, better posture and core strength, increased lung function, weight loss, and positive self-image. Yoga's heart health benefits include lowered blood pressure, healthier heart rate, decreased cholesterol and triglyceride levels, improved immune function, and reduced stress. Seek guidance from your physician and exercise physiologist before implementing an exercise routine and learn your capabilities and proper form for all exercise.  Medical   Aging: Enhancing Your Quality of Life  Clinical staff conducted group or individual video education with verbal and written material and guidebook.  Patient learns key strategies and recommendations to stay in good physical health and enhance quality of life, such as prevention strategies, having an advocate, securing a Health Care Proxy and Power of [place],  and keeping a list of medications and system for tracking them. It also discusses how to avoid risk for bone loss.  Biology of Weight Control   Clinical staff conducted group or individual video education with verbal and written material and guidebook.  Patient learns that weight gain occurs because we consume more calories than we burn (eating more, moving less). Even if your body weight is normal, you may have higher ratios of fat compared to muscle mass. Too much body fat puts you at increased risk for cardiovascular disease, heart attack, stroke, type 2 diabetes, and obesity-related cancers. In addition to exercise, following the Pritikin Eating Plan can help reduce your risk.  Decoding Lab Results  Clinical staff conducted group or individual video education with verbal and written material and guidebook.  Patient learns that lab test reflects one measurement whose values change over time and are influenced by many factors, including medication, stress, sleep, exercise, food, hydration, pre-existing medical conditions, and more. It is recommended to use the knowledge from this video to become more involved with your lab results and evaluate your numbers to speak with your doctor.   Diseases of Our Time - Overview  Clinical staff conducted group or individual video education with verbal and written material and guidebook.  Patient learns that according to the CDC, 50% to 70% of chronic diseases (such as obesity, type 2 diabetes, elevated lipids, hypertension, and heart disease) are avoidable through lifestyle improvements including healthier food choices, listening to satiety cues, and increased physical activity.  Sleep Disorders Clinical staff conducted group or individual video education with verbal and written material and guidebook.  Patient learns how good quality and duration of sleep are important to overall health and well-being. Patient also learns about sleep disorders and how they impact health along with recommendations to address them, including discussing with a physician.  Nutrition  Dining Out - Part 2 Clinical staff  conducted group or individual video education with verbal and written material and guidebook.  Patient learns how to plan ahead and communicate in order to maximize their dining experience in a healthy and nutritious manner. Included are recommended food choices based on the type of restaurant the patient is visiting.   Fueling a Banker conducted group or individual video education with verbal and written material and guidebook.  There is a strong connection between our food choices and our health. Diseases like obesity and type 2 diabetes are very prevalent and are in large-part due to lifestyle choices. The Pritikin Eating Plan provides plenty of food and hunger-curbing satisfaction. It is easy to follow, affordable, and helps reduce health risks.  Menu Workshop  Clinical staff conducted group or individual video education with verbal and written material and guidebook.  Patient learns that restaurant meals can sabotage health goals because they are often packed with calories, fat, sodium, and sugar. Recommendations include strategies to plan ahead and to communicate with the manager, chef, or server to help order a healthier meal.  Planning Your Eating Strategy  Clinical staff conducted group or individual video education with verbal and written material and guidebook.  Patient learns about the Pritikin Eating Plan and its benefit of reducing the risk of disease. The Pritikin Eating Plan does not focus on calories. Instead, it emphasizes high-quality, nutrient-rich foods. By knowing the characteristics of the foods, we choose, we can determine their calorie density and make informed decisions.  Targeting Your Nutrition Priorities  Clinical staff conducted group or individual video  education with verbal and written material and guidebook.  Patient learns that lifestyle habits have a tremendous impact on disease risk and progression. This video provides eating and physical  activity recommendations based on your personal health goals, such as reducing LDL cholesterol, losing weight, preventing or controlling type 2 diabetes, and reducing high blood pressure.  Vitamins and Minerals  Clinical staff conducted group or individual video education with verbal and written material and guidebook.  Patient learns different ways to obtain key vitamins and minerals, including through a recommended healthy diet. It is important to discuss all supplements you take with your doctor.   Healthy Mind-Set    Smoking Cessation  Clinical staff conducted group or individual video education with verbal and written material and guidebook.  Patient learns that cigarette smoking and tobacco addiction pose a serious health risk which affects millions of people. Stopping smoking will significantly reduce the risk of heart disease, lung disease, and many forms of cancer. Recommended strategies for quitting are covered, including working with your doctor to develop a successful plan.  Culinary   Becoming a Set designer conducted group or individual video education with verbal and written material and guidebook.  Patient learns that cooking at home can be healthy, cost-effective, quick, and puts them in control. Keys to cooking healthy recipes will include looking at your recipe, assessing your equipment needs, planning ahead, making it simple, choosing cost-effective seasonal ingredients, and limiting the use of added fats, salts, and sugars.  Cooking - Breakfast and Snacks  Clinical staff conducted group or individual video education with verbal and written material and guidebook.  Patient learns how important breakfast is to satiety and nutrition through the entire day. Recommendations include key foods to eat during breakfast to help stabilize blood sugar levels and to prevent overeating at meals later in the day. Planning ahead is also a key component.  Cooking - Psychologist, educational conducted group or individual video education with verbal and written material and guidebook.  Patient learns eating strategies to improve overall health, including an approach to cook more at home. Recommendations include thinking of animal protein as a side on your plate rather than center stage and focusing instead on lower calorie dense options like vegetables, fruits, whole grains, and plant-based proteins, such as beans. Making sauces in large quantities to freeze for later and leaving the skin on your vegetables are also recommended to maximize your experience.  Cooking - Healthy Salads and Dressing Clinical staff conducted group or individual video education with verbal and written material and guidebook.  Patient learns that vegetables, fruits, whole grains, and legumes are the foundations of the Pritikin Eating Plan. Recommendations include how to incorporate each of these in flavorful and healthy salads, and how to create homemade salad dressings. Proper handling of ingredients is also covered. Cooking - Soups and [employer] - Soups and Desserts Clinical staff conducted group or individual video education with verbal and written material and guidebook.  Patient learns that Pritikin soups and desserts make for easy, nutritious, and delicious snacks and meal components that are low in sodium, fat, sugar, and calorie density, while high in vitamins, minerals, and filling fiber. Recommendations include simple and healthy ideas for soups and desserts.   Overview     The Pritikin Solution Program Overview Clinical staff conducted group or individual video education with verbal and written material and guidebook.  Patient learns that the results of the Pritikin Program have  been documented in more than 100 articles published in peer-reviewed journals, and the benefits include reducing risk factors for (and, in some cases, even reversing) high cholesterol, high  blood pressure, type 2 diabetes, obesity, and more! An overview of the three key pillars of the Pritikin Program will be covered: eating well, doing regular exercise, and having a healthy mind-set.  WORKSHOPS  Exercise: Exercise Basics: Building Your Action Plan Clinical staff led group instruction and group discussion with PowerPoint presentation and patient guidebook. To enhance the learning environment the use of posters, models and videos may be added. At the conclusion of this workshop, patients will comprehend the difference between physical activity and exercise, as well as the benefits of incorporating both, into their routine. Patients will understand the FITT (Frequency, Intensity, Time, and Type) principle and how to use it to build an exercise action plan. In addition, safety concerns and other considerations for exercise and cardiac rehab will be addressed by the presenter. The purpose of this lesson is to promote a comprehensive and effective weekly exercise routine in order to improve patients' overall level of fitness.   Managing Heart Disease: Your Path to a Healthier Heart Clinical staff led group instruction and group discussion with PowerPoint presentation and patient guidebook. To enhance the learning environment the use of posters, models and videos may be added.At the conclusion of this workshop, patients will understand the anatomy and physiology of the heart. Additionally, they will understand how Pritikin's three pillars impact the risk factors, the progression, and the management of heart disease.  The purpose of this lesson is to provide a high-level overview of the heart, heart disease, and how the Pritikin lifestyle positively impacts risk factors.  Exercise Biomechanics Clinical staff led group instruction and group discussion with PowerPoint presentation and patient guidebook. To enhance the learning environment the use of posters, models and videos may be added.  Patients will learn how the structural parts of their bodies function and how these functions impact their daily activities, movement, and exercise. Patients will learn how to promote a neutral spine, learn how to manage pain, and identify ways to improve their physical movement in order to promote healthy living. The purpose of this lesson is to expose patients to common physical limitations that impact physical activity. Participants will learn practical ways to adapt and manage aches and pains, and to minimize their effect on regular exercise. Patients will learn how to maintain good posture while sitting, walking, and lifting.  Balance Training and Fall Prevention  Clinical staff led group instruction and group discussion with PowerPoint presentation and patient guidebook. To enhance the learning environment the use of posters, models and videos may be added. At the conclusion of this workshop, patients will understand the importance of their sensorimotor skills (vision, proprioception, and the vestibular system) in maintaining their ability to balance as they age. Patients will apply a variety of balancing exercises that are appropriate for their current level of function. Patients will understand the common causes for poor balance, possible solutions to these problems, and ways to modify their physical environment in order to minimize their fall risk. The purpose of this lesson is to teach patients about the importance of maintaining balance as they age and ways to minimize their risk of falling.  WORKSHOPS   Nutrition:  Fueling a Ship broker led group instruction and group discussion with PowerPoint presentation and patient guidebook. To enhance the learning environment the use of posters, models and videos may be  added. Patients will review the foundational principles of the Pritikin Eating Plan and understand what constitutes a serving size in each of the food groups.  Patients will also learn Pritikin-friendly foods that are better choices when away from home and review make-ahead meal and snack options. Calorie density will be reviewed and applied to three nutrition priorities: weight maintenance, weight loss, and weight gain. The purpose of this lesson is to reinforce (in a group setting) the key concepts around what patients are recommended to eat and how to apply these guidelines when away from home by planning and selecting Pritikin-friendly options. Patients will understand how calorie density may be adjusted for different weight management goals.  Mindful Eating  Clinical staff led group instruction and group discussion with PowerPoint presentation and patient guidebook. To enhance the learning environment the use of posters, models and videos may be added. Patients will briefly review the concepts of the Pritikin Eating Plan and the importance of low-calorie dense foods. The concept of mindful eating will be introduced as well as the importance of paying attention to internal hunger signals. Triggers for non-hunger eating and techniques for dealing with triggers will be explored. The purpose of this lesson is to provide patients with the opportunity to review the basic principles of the Pritikin Eating Plan, discuss the value of eating mindfully and how to measure internal cues of hunger and fullness using the Hunger Scale. Patients will also discuss reasons for non-hunger eating and learn strategies to use for controlling emotional eating.  Targeting Your Nutrition Priorities Clinical staff led group instruction and group discussion with PowerPoint presentation and patient guidebook. To enhance the learning environment the use of posters, models and videos may be added. Patients will learn how to determine their genetic susceptibility to disease by reviewing their family history. Patients will gain insight into the importance of diet as part of an overall healthy  lifestyle in mitigating the impact of genetics and other environmental insults. The purpose of this lesson is to provide patients with the opportunity to assess their personal nutrition priorities by looking at their family history, their own health history and current risk factors. Patients will also be able to discuss ways of prioritizing and modifying the Pritikin Eating Plan for their highest risk areas  Menu  Clinical staff led group instruction and group discussion with PowerPoint presentation and patient guidebook. To enhance the learning environment the use of posters, models and videos may be added. Using menus brought in from [employer], or printed from [employer], patients will apply the Pritikin dining out guidelines that were presented in the [employer] video. Patients will also be able to practice these guidelines in a variety of provided scenarios. The purpose of this lesson is to provide patients with the opportunity to practice hands-on learning of the Pritikin Dining Out guidelines with actual menus and practice scenarios.  Label Reading Clinical staff led group instruction and group discussion with PowerPoint presentation and patient guidebook. To enhance the learning environment the use of posters, models and videos may be added. Patients will review and discuss the Pritikin label reading guidelines presented in Pritikin's Label Reading Educational series video. Using fool labels brought in from local grocery stores and markets, patients will apply the label reading guidelines and determine if the packaged food meet the Pritikin guidelines. The purpose of this lesson is to provide patients with the opportunity to review, discuss, and practice hands-on learning of the Pritikin Label Reading guidelines with actual  packaged food labels. Cooking School  Pritikin's [employer] are designed to teach patients ways to prepare quick, simple, and  affordable recipes at home. The importance of nutrition's role in chronic disease risk reduction is reflected in its emphasis in the overall Pritikin program. By learning how to prepare essential core [facility] Eating Plan recipes, patients will increase control over what they eat; be able to customize the flavor of foods without the use of added salt, sugar, or fat; and improve the quality of the food they consume. By learning a set of core recipes which are easily assembled, quickly prepared, and affordable, patients are more likely to prepare more healthy foods at home. These workshops focus on convenient breakfasts, simple entres, side dishes, and desserts which can be prepared with minimal effort and are consistent with nutrition recommendations for cardiovascular risk reduction. Cooking [employer] are taught by a Armed forces logistics/support/administrative officer (RD) who has been trained by the [employer]. The chef or RD has a clear understanding of the importance of minimizing - if not completely eliminating - added fat, sugar, and sodium in recipes. Throughout the series of Cooking School Workshop sessions, patients will learn about healthy ingredients and efficient methods of cooking to build confidence in their capability to prepare    Cooking School weekly topics:  Adding Flavor- Sodium-Free  Fast and Healthy Breakfasts  Powerhouse Plant-Based Proteins  Satisfying Salads and Dressings  Simple Sides and Sauces  International Cuisine-Spotlight on the [employer] Zones  Delicious Desserts  Savory Soups  [employer] - Meals in a Astronomer Appetizers and Snacks  Comforting Weekend Breakfasts  One-Pot Wonders   Fast Evening Meals  Landscape architect Your [facility]in Plate  WORKSHOPS   Healthy Mindset (Psychosocial):  Focused Goals, Sustainable Changes Clinical staff led group instruction and group discussion with PowerPoint presentation and patient guidebook. To enhance  the learning environment the use of posters, models and videos may be added. Patients will be able to apply effective goal setting strategies to establish at least one personal goal, and then take consistent, meaningful action toward that goal. They will learn to identify common barriers to achieving personal goals and develop strategies to overcome them. Patients will also gain an understanding of how our mind-set can impact our ability to achieve goals and the importance of cultivating a positive and growth-oriented mind-set. The purpose of this lesson is to provide patients with a deeper understanding of how to set and achieve personal goals, as well as the tools and strategies needed to overcome common obstacles which may arise along the way.  From Head to Heart: The Power of a Healthy Outlook  Clinical staff led group instruction and group discussion with PowerPoint presentation and patient guidebook. To enhance the learning environment the use of posters, models and videos may be added. Patients will be able to recognize and describe the impact of emotions and mood on physical health. They will discover the importance of self-care and explore self-care practices which may work for them. Patients will also learn how to utilize the 4 C's to cultivate a healthier outlook and better manage stress and challenges. The purpose of this lesson is to demonstrate to patients how a healthy outlook is an essential part of maintaining good health, especially as they continue their cardiac rehab journey.  Healthy Sleep for a Healthy Heart Clinical staff led group instruction and group discussion with PowerPoint presentation and patient guidebook. To enhance the learning environment the  use of posters, models and videos may be added. At the conclusion of this workshop, patients will be able to demonstrate knowledge of the importance of sleep to overall health, well-being, and quality of life. They will understand the  symptoms of, and treatments for, common sleep disorders. Patients will also be able to identify daytime and nighttime behaviors which impact sleep, and they will be able to apply these tools to help manage sleep-related challenges. The purpose of this lesson is to provide patients with a general overview of sleep and outline the importance of quality sleep. Patients will learn about a few of the most common sleep disorders. Patients will also be introduced to the concept of "sleep hygiene," and discover ways to self-manage certain sleeping problems through simple daily behavior changes. Finally, the workshop will motivate patients by clarifying the links between quality sleep and their goals of heart-healthy living.   Recognizing and Reducing Stress Clinical staff led group instruction and group discussion with PowerPoint presentation and patient guidebook. To enhance the learning environment the use of posters, models and videos may be added. At the conclusion of this workshop, patients will be able to understand the types of stress reactions, differentiate between acute and chronic stress, and recognize the impact that chronic stress has on their health. They will also be able to apply different coping mechanisms, such as reframing negative self-talk. Patients will have the opportunity to practice a variety of stress management techniques, such as deep abdominal breathing, progressive muscle relaxation, and/or guided imagery.  The purpose of this lesson is to educate patients on the role of stress in their lives and to provide healthy techniques for coping with it.  Learning Barriers/Preferences:  Learning Barriers/Preferences - 04/07/23 1354       Learning Barriers/Preferences   Learning Barriers Sight   wears glasses   Learning Preferences Audio;Group Instruction;Individual Instruction;Pictoral;Skilled Demonstration;Verbal Instruction;Video;Written Material             Education  Topics:  Knowledge Questionnaire Score:  Knowledge Questionnaire Score - 04/07/23 1352       Knowledge Questionnaire Score   Pre Score 18/24             Core Components/Risk Factors/Patient Goals at Admission:  Personal Goals and Risk Factors at Admission - 04/07/23 1352       Core Components/Risk Factors/Patient Goals on Admission    Weight Management Yes;Weight Loss    Intervention Weight Management: Develop a combined nutrition and exercise program designed to reach desired caloric intake, while maintaining appropriate intake of nutrient and fiber, sodium and fats, and appropriate energy expenditure required for the weight goal.;Weight Management: Provide education and appropriate resources to help participant work on and attain dietary goals.    Expected Outcomes Short Term: Continue to assess and modify interventions until short term weight is achieved;Long Term: Adherence to nutrition and physical activity/exercise program aimed toward attainment of established weight goal;Weight Loss: Understanding of general recommendations for a balanced deficit meal plan, which promotes 1-2 lb weight loss per week and includes a negative energy balance of [phone] kcal/d;Understanding recommendations for meals to include 15-35% energy as protein, 25-35% energy from fat, 35-60% energy from carbohydrates, less than 200mg  of dietary cholesterol, 20-35 gm of total fiber daily;Understanding of distribution of calorie intake throughout the day with the consumption of 4-5 meals/snacks    Diabetes Yes    Intervention Provide education about signs/symptoms and action to take for hypo/hyperglycemia.;Provide education about proper nutrition, including hydration, and aerobic/resistive exercise prescription  along with prescribed medications to achieve blood glucose in normal ranges: Fasting glucose 65-99 mg/dL    Expected Outcomes Short Term: Participant verbalizes understanding of the signs/symptoms and  immediate care of hyper/hypoglycemia, proper foot care and importance of medication, aerobic/resistive exercise and nutrition plan for blood glucose control.;Long Term: Attainment of HbA1C < 7%.    Hypertension Yes    Intervention Provide education on lifestyle modifcations including regular physical activity/exercise, weight management, moderate sodium restriction and increased consumption of fresh fruit, vegetables, and low fat dairy, alcohol moderation, and smoking cessation.;Monitor prescription use compliance.    Expected Outcomes Short Term: Continued assessment and intervention until BP is < 140/30mm HG in hypertensive participants. < 130/100mm HG in hypertensive participants with diabetes, heart failure or chronic kidney disease.;Long Term: Maintenance of blood pressure at goal levels.    Lipids Yes    Intervention Provide education and support for participant on nutrition & aerobic/resistive exercise along with prescribed medications to achieve LDL 70mg , HDL >40mg .    Expected Outcomes Short Term: Participant states understanding of desired cholesterol values and is compliant with medications prescribed. Participant is following exercise prescription and nutrition guidelines.;Long Term: Cholesterol controlled with medications as prescribed, with individualized exercise RX and with personalized nutrition plan. Value goals: LDL < 70mg , HDL > 40 mg.    Stress Yes    Intervention Offer individual and/or small group education and counseling on adjustment to heart disease, stress management and health-related lifestyle change. Teach and support self-help strategies.;Refer participants experiencing significant psychosocial distress to appropriate mental health specialists for further evaluation and treatment. When possible, include family members and significant others in education/counseling sessions.    Expected Outcomes Short Term: Participant demonstrates changes in health-related behavior, relaxation  and other stress management skills, ability to obtain effective social support, and compliance with psychotropic medications if prescribed.;Long Term: Emotional wellbeing is indicated by absence of clinically significant psychosocial distress or social isolation.             Core Components/Risk Factors/Patient Goals Review:   Goals and Risk Factor Review     Row Name 04/13/23 1650             Core Components/Risk Factors/Patient Goals Review   Personal Goals Review Weight Management/Obesity;Stress;Hypertension;Lipids;Diabetes       Review Taronda started cardiac rehab on 04/13/23 and did fair with exercise for her fitness level. Vital signs. Patient reported some genralized weakness deined shortness of breath. Encouraged to increase hydration. Neomi is deconditioned.       Expected Outcomes Toluwani will continue to participate in cardiac rehab for exercise, nutrition and lifestyle modifications                Core Components/Risk Factors/Patient Goals at Discharge (Final Review):   Goals and Risk Factor Review - 04/13/23 1650       Core Components/Risk Factors/Patient Goals Review   Personal Goals Review Weight Management/Obesity;Stress;Hypertension;Lipids;Diabetes    Review Madhumitha started cardiac rehab on 04/13/23 and did fair with exercise for her fitness level. Vital signs. Patient reported some genralized weakness deined shortness of breath. Encouraged to increase hydration. Aziza is deconditioned.    Expected Outcomes Winda will continue to participate in cardiac rehab for exercise, nutrition and lifestyle modifications             ITP Comments:  ITP Comments     Row Name 04/07/23 1327 04/13/23 1646         ITP Comments Rondell Reams, MD: Medical Director. Introduction to the  [facility] / Intensive Cardiac Rehab.  Initial orientation packet reviewed with the patient today. 30 Day ITP comment. Jalise started cardiac rehab on 04/13/23 and did well wiht exercise  for her fitness level               ITP Comments     Row Name 04/07/23 1327 04/13/23 1646         ITP Comments Rondell Reams, MD: Medical Director. Introduction to the [employer] / Intensive Cardiac Rehab.  Initial orientation packet reviewed with the patient today. 30 Day ITP comment. Shirla started cardiac rehab on 04/13/23 and did well wiht exercise for her fitness level               Comments: Pt started cardiac rehab today.  Pt tolerated light exercise without difficulty. VSS, telemetry-Sinus Rhythm, Zykera reports she has felt weak since her cardiac stenting, denies chest pain or dizziness today. Lesleyanne did have a coughing spell at the end of exercise which she said was related to her allergies. Ashleyn says she only had about 12 ounces of water today. Asked the patient to make sure that she drinks 16-32 ounces of water before coming to exercise. Patient states understanding.  Medication list reconciled. Pt denies barriers to medicaiton compliance.  PSYCHOSOCIAL ASSESSMENT:  PHQ-6. Pt exhibits positive coping skills, hopeful outlook with supportive family. No psychosocial needs identified at this time, no psychosocial interventions necessary.    Pt enjoys canning, watching K drama's, sewing, going to the beach and visiting family.   Pt oriented to exercise equipment and routine.    Understanding verbalized. Thayer Headings RN BSN

## 2023-04-15 ENCOUNTER — Encounter (HOSPITAL_COMMUNITY)
Admission: RE | Admit: 2023-04-15 | Discharge: 2023-04-15 | Disposition: A | Discharge status: Home / Self Care | Payer: PPO | Source: Ambulatory Visit | Attending: Cardiovascular Disease | Admitting: Cardiovascular Disease

## 2023-04-15 ENCOUNTER — Encounter (HOSPITAL_COMMUNITY): Payer: PPO

## 2023-04-15 DIAGNOSIS — Z955 Presence of coronary angioplasty implant and graft: Secondary | ICD-10-CM | POA: Diagnosis present

## 2023-04-15 DIAGNOSIS — I10 Essential (primary) hypertension: Secondary | ICD-10-CM | POA: Diagnosis not present

## 2023-04-15 DIAGNOSIS — Z48812 Encounter for surgical aftercare following surgery on the circulatory system: Secondary | ICD-10-CM | POA: Diagnosis not present

## 2023-04-15 DIAGNOSIS — I214 Non-ST elevation (NSTEMI) myocardial infarction: Secondary | ICD-10-CM | POA: Insufficient documentation

## 2023-04-15 LAB — GLUCOSE, CAPILLARY
Glucose-Capillary: 174 mg/dL — ABNORMAL HIGH (ref 70–99)
Glucose-Capillary: 156 mg/dL — ABNORMAL HIGH (ref 70–99)

## 2023-04-18 ENCOUNTER — Encounter (HOSPITAL_COMMUNITY): Payer: PPO

## 2023-04-20 ENCOUNTER — Other Ambulatory Visit (HOSPITAL_COMMUNITY): Payer: Self-pay

## 2023-04-20 ENCOUNTER — Encounter (HOSPITAL_COMMUNITY): Payer: PPO

## 2023-04-20 ENCOUNTER — Encounter (HOSPITAL_COMMUNITY)
Admission: RE | Admit: 2023-04-20 | Discharge: 2023-04-20 | Disposition: A | Discharge status: Home / Self Care | Payer: PPO | Source: Ambulatory Visit | Attending: Cardiovascular Disease | Admitting: Cardiovascular Disease

## 2023-04-20 DIAGNOSIS — Z48812 Encounter for surgical aftercare following surgery on the circulatory system: Secondary | ICD-10-CM | POA: Diagnosis not present

## 2023-04-20 DIAGNOSIS — I214 Non-ST elevation (NSTEMI) myocardial infarction: Secondary | ICD-10-CM

## 2023-04-20 DIAGNOSIS — Z955 Presence of coronary angioplasty implant and graft: Secondary | ICD-10-CM

## 2023-04-22 ENCOUNTER — Encounter (HOSPITAL_COMMUNITY): Payer: PPO

## 2023-04-25 ENCOUNTER — Encounter (HOSPITAL_COMMUNITY): Payer: PPO

## 2023-04-25 DIAGNOSIS — Z006 Encounter for examination for normal comparison and control in clinical research program: Secondary | ICD-10-CM

## 2023-04-25 MED ORDER — STUDY - LIBREXIA-ACS - MILVEXIAN 25 MG OR PLACEBO TABLET (PI-STUCKEY)
1.0000 | ORAL_TABLET | Freq: Two times a day (BID) | ORAL | 0 refills | Status: AC
Start: 2023-04-25 — End: ?

## 2023-04-25 NOTE — Research (Cosign Needed Addendum)
 [facility] ACS 4 WEEK VISIT    [facility] Hematology/Chemistry drawn: [x] Yes [] No   PK/PD Biomarkers drawn: [x] Yes [] No Subject took morning dose of IP prior to PK draw.   EQ-5D-5L Assessment Completed? [x] Yes [] No Reason not done: [] Subject Forgot [] Subject too ill [] Subject refused [] Technical failure [] Other If Other, Specify   Medication Kit Accountability Dispensed Status [x] Dispensed [] Dispensed In Error [] Not Dispensed If 'Not Dispensed', provide reason: [] Medical Decision [] Medication Kit not available or damaged [] Study medication discontinued [] Visit skipped or administration skipped [] Medication kit dispensed in error Date Dispensed: 12/Aug/2024 Amount Dispensed:    [id number] (Milvexian) 25mg  or Placebo -  [phone], [phone], [phone]  Return Status: [] Damaged/Returned by subject [] Missing/Not returned by subject [x] Returned by subject If not returned reasons: [] Forgot [] Lost Date Returned: 12/Aug/2024 Amount Returned: [id number] (Milvexian) 25mg  or Placebo - [id number]  14 tablets    Compliance %: 100  Were any suspected endpoint events or adverse events experienced? [] Yes [x] No   Current Outpatient Medications:    aspirin  EC 81 MG tablet, Take 1 tablet (81 mg total) by mouth daily. Swallow whole., Disp: 120 tablet, Rfl: 3   atorvastatin  (LIPITOR) 40 MG tablet, Take 1 tablet (40 mg total) by mouth daily., Disp: 30 tablet, Rfl: 3   azelastine (ASTELIN) 137 MCG/SPRAY nasal spray, Place 1 spray into the nose daily as needed for allergies (post nasal drip). Use in each nostril as directed, Disp: , Rfl:    benazepril -hydrochlorthiazide (LOTENSIN  HCT) 20-25 MG per tablet, Take 1 tablet by mouth daily., Disp: , Rfl:    Beta Carotene (VITAMIN A) 25000 UNIT capsule, Take 25,000 Units by mouth daily., Disp: , Rfl:    clonazePAM  (KLONOPIN ) 0.5 MG tablet, Take 0.25 mg by mouth at bedtime as needed for anxiety (sleep)., Disp: , Rfl:    levothyroxine  (SYNTHROID ,  LEVOTHROID) 50 MCG tablet, TAKE 1 TABLET (50 MCG TOTAL) BY MOUTH DAILY BEFORE BREAKFAST., Disp: 30 tablet, Rfl: 5   metFORMIN  (GLUCOPHAGE ) 500 MG tablet, Take 500-1,000 mg by mouth See admin instructions. 500 mg at lunch, 1000 mg with dinner., Disp: , Rfl:    metoprolol  succinate (TOPROL -XL) 25 MG 24 hr tablet, Take 1 tablet (25 mg total) by mouth daily., Disp: 30 tablet, Rfl: 3   potassium chloride  SA (K-DUR,KLOR-CON ) 20 MEQ tablet, Take 1 tablet (20 mEq total) by mouth daily. (Patient taking differently: Take 20 mEq by mouth daily with lunch.), Disp: 30 tablet, Rfl: 3   Study - [facility]REXIA-ACS - milvexian 25 mg or placebo tablet (PI-Stuckey), Take 1 tablet by mouth 2 (two) times daily. Take with or without food. Bring bottle back to every visit. For Investigational Use Only. Please contact [facility] for any questions or concerns regarding this medication., Disp: 210 tablet, Rfl: 0   ticagrelor  (BRILINTA ) 90 MG TABS tablet, Take 1 tablet (90 mg total) by mouth 2 (two) times daily., Disp: 60 tablet, Rfl: 3   glucose blood (FREESTYLE TEST STRIPS) test strip, USE AS INSTRUCTED TO CHECK BLOOD SUGAR ONCE DAILY DX CODE E11.9, Disp: 50 each, Rfl: 0   nitroGLYCERIN  (NITROSTAT ) 0.4 MG SL tablet, Place 1 tablet (0.4 mg total) under the tongue every 5 (five) minutes x 3 doses as needed for chest pain., Disp: 25 tablet, Rfl: 0   PROAIR  HFA 108 (90 BASE) MCG/ACT inhaler, INHALE 1 TO 2 PUFFS BY MOUTH EVERY 6 HOURS AS NEEDED, Disp: , Rfl: 1    Are there any labs that are clinically significant?  Yes []  OR No[x]  TDS

## 2023-04-27 ENCOUNTER — Encounter (HOSPITAL_COMMUNITY)
Admission: RE | Admit: 2023-04-27 | Discharge: 2023-04-27 | Disposition: A | Discharge status: Home / Self Care | Payer: PPO | Source: Ambulatory Visit | Attending: Cardiovascular Disease | Admitting: Cardiovascular Disease

## 2023-04-27 ENCOUNTER — Encounter (HOSPITAL_COMMUNITY): Payer: PPO

## 2023-04-27 DIAGNOSIS — Z955 Presence of coronary angioplasty implant and graft: Secondary | ICD-10-CM

## 2023-04-27 DIAGNOSIS — Z48812 Encounter for surgical aftercare following surgery on the circulatory system: Secondary | ICD-10-CM | POA: Diagnosis not present

## 2023-04-27 DIAGNOSIS — I214 Non-ST elevation (NSTEMI) myocardial infarction: Secondary | ICD-10-CM

## 2023-04-29 ENCOUNTER — Encounter (HOSPITAL_COMMUNITY)
Admission: RE | Admit: 2023-04-29 | Discharge: 2023-04-29 | Disposition: A | Discharge status: Home / Self Care | Payer: PPO | Source: Ambulatory Visit | Attending: Cardiovascular Disease | Admitting: Cardiovascular Disease

## 2023-04-29 ENCOUNTER — Other Ambulatory Visit (HOSPITAL_COMMUNITY): Payer: Self-pay

## 2023-04-29 ENCOUNTER — Encounter (HOSPITAL_COMMUNITY): Payer: PPO

## 2023-04-29 DIAGNOSIS — Z955 Presence of coronary angioplasty implant and graft: Secondary | ICD-10-CM

## 2023-04-29 DIAGNOSIS — I214 Non-ST elevation (NSTEMI) myocardial infarction: Secondary | ICD-10-CM

## 2023-04-29 DIAGNOSIS — Z48812 Encounter for surgical aftercare following surgery on the circulatory system: Secondary | ICD-10-CM | POA: Diagnosis not present

## 2023-04-29 MED ORDER — FREESTYLE LIBRE 2 READER DEVI
5 refills | Status: DC
  Filled 2023-04-29: qty 1, 30d supply, fill #0

## 2023-04-29 MED ORDER — POTASSIUM CHLORIDE ER 20 MEQ PO TBCR
20.0000 meq | EXTENDED_RELEASE_TABLET | Freq: Every day | ORAL | 1 refills | Status: DC
  Filled 2023-04-29: qty 90, 90d supply, fill #0
  Filled 2023-05-20: qty 30, 30d supply, fill #0
  Filled 2023-06-17: qty 30, 30d supply, fill #1
  Filled 2023-07-13: qty 30, 30d supply, fill #2
  Filled 2023-09-02: qty 30, 30d supply, fill #3
  Filled 2023-09-30: qty 30, 30d supply, fill #4
  Filled 2023-10-27: qty 30, 30d supply, fill #5

## 2023-04-29 MED ORDER — FREESTYLE LIBRE 2 SENSOR MISC
5 refills | Status: DC
  Filled 2023-04-29: qty 2, 28d supply, fill #0
  Filled 2023-06-08: qty 2, 28d supply, fill #1
  Filled 2023-07-06: qty 2, 28d supply, fill #2
  Filled 2023-08-03: qty 2, 28d supply, fill #3
  Filled 2023-08-31: qty 2, 28d supply, fill #4
  Filled 2023-09-30: qty 2, 28d supply, fill #5

## 2023-04-29 MED ORDER — CLONAZEPAM 0.5 MG PO TABS
0.2500 mg | ORAL_TABLET | ORAL | 0 refills | Status: DC | PRN
Start: 2023-04-29 — End: 2023-09-16
  Filled 2023-04-29: qty 30, 60d supply, fill #0

## 2023-04-29 MED ORDER — ALBUTEROL SULFATE HFA 108 (90 BASE) MCG/ACT IN AERS
1.0000 | INHALATION_SPRAY | RESPIRATORY_TRACT | 1 refills | Status: AC | PRN
  Filled 2023-04-29: qty 6.7, 17d supply, fill #0

## 2023-04-29 NOTE — Progress Notes (Signed)
CARDIAC REHAB PHASE 2  Reviewed home exercise with pt today. Pt is tolerating exercise well. Pt will continue to exercise on her own by walking and using her home stepper (as able) for 15-30 minutes per session 3-4 days a week in addition to the 3 days in CRP2. Advised pt on THRR, RPE scale, hydration and temperature/humidity precautions. Reinforced NTG use, S/S to stop exercise and when to call MD vs 911. Encouraged warm up cool down and stretches with exercise sessions. Pt verbalized understanding, all questions were answered and pt was given a copy to take home.    Harrie Jeans ACSM-CEP 04/29/2023 5:17 PM

## 2023-05-02 ENCOUNTER — Encounter (HOSPITAL_COMMUNITY): Payer: PPO

## 2023-05-02 ENCOUNTER — Other Ambulatory Visit (HOSPITAL_COMMUNITY): Payer: Self-pay

## 2023-05-04 ENCOUNTER — Encounter (HOSPITAL_COMMUNITY): Payer: PPO

## 2023-05-04 ENCOUNTER — Encounter (HOSPITAL_COMMUNITY)
Admission: RE | Admit: 2023-05-04 | Discharge: 2023-05-04 | Disposition: A | Discharge status: Home / Self Care | Payer: PPO | Source: Ambulatory Visit | Attending: Cardiovascular Disease | Admitting: Cardiovascular Disease

## 2023-05-04 DIAGNOSIS — Z955 Presence of coronary angioplasty implant and graft: Secondary | ICD-10-CM

## 2023-05-04 DIAGNOSIS — I214 Non-ST elevation (NSTEMI) myocardial infarction: Secondary | ICD-10-CM

## 2023-05-04 DIAGNOSIS — Z48812 Encounter for surgical aftercare following surgery on the circulatory system: Secondary | ICD-10-CM | POA: Diagnosis not present

## 2023-05-06 ENCOUNTER — Encounter (HOSPITAL_COMMUNITY): Payer: PPO

## 2023-05-06 ENCOUNTER — Encounter (HOSPITAL_COMMUNITY)
Admission: RE | Admit: 2023-05-06 | Discharge: 2023-05-06 | Disposition: A | Discharge status: Home / Self Care | Payer: PPO | Source: Ambulatory Visit | Attending: Cardiovascular Disease | Admitting: Cardiovascular Disease

## 2023-05-06 DIAGNOSIS — Z48812 Encounter for surgical aftercare following surgery on the circulatory system: Secondary | ICD-10-CM | POA: Diagnosis not present

## 2023-05-06 DIAGNOSIS — Z955 Presence of coronary angioplasty implant and graft: Secondary | ICD-10-CM

## 2023-05-06 DIAGNOSIS — I214 Non-ST elevation (NSTEMI) myocardial infarction: Secondary | ICD-10-CM

## 2023-05-09 ENCOUNTER — Encounter (HOSPITAL_COMMUNITY): Payer: PPO

## 2023-05-10 NOTE — Progress Notes (Signed)
Cardiac Individual Treatment Plan  Patient Details  Name: ARLEDA LAHIFF MRN: [id number] Date of Birth: 05/31/1946 Referring Provider:   Flowsheet Row INTENSIVE CARDIAC REHAB ORIENT from 04/07/2023 in [facility]  Referring Provider Nanetta Batty, MD       Initial Encounter Date:  Flowsheet Row INTENSIVE CARDIAC REHAB ORIENT from 04/07/2023 in [facility]  Date 04/07/23       Visit Diagnosis: NSTEMI (non-ST elevated myocardial infarction) (HCC)  S/P drug eluting coronary stent placement  Patient's Home Medications on Admission:  Current Outpatient Medications:    albuterol (VENTOLIN HFA) 108 (90 Base) MCG/ACT inhaler, Inhale 1 puff into the lungs every 4 (four) hours as needed., Disp: 6.7 g, Rfl: 1   aspirin EC 81 MG tablet, Take 1 tablet (81 mg total) by mouth daily. Swallow whole., Disp: 120 tablet, Rfl: 3   atorvastatin (LIPITOR) 40 MG tablet, Take 1 tablet (40 mg total) by mouth daily., Disp: 30 tablet, Rfl: 3   azelastine (ASTELIN) 137 MCG/SPRAY nasal spray, Place 1 spray into the nose daily as needed for allergies (post nasal drip). Use in each nostril as directed, Disp: , Rfl:    benazepril-hydrochlorthiazide (LOTENSIN HCT) 20-25 MG per tablet, Take 1 tablet by mouth daily., Disp: , Rfl:    Beta Carotene (VITAMIN A) 25000 UNIT capsule, Take 25,000 Units by mouth daily., Disp: , Rfl:    clonazePAM (KLONOPIN) 0.5 MG tablet, Take 0.25 mg by mouth at bedtime as needed for anxiety (sleep)., Disp: , Rfl:    clonazePAM (KLONOPIN) 0.5 MG tablet, Take 0.5-1 tablets (0.25-0.5 mg total) by mouth as needed., Disp: 30 tablet, Rfl: 0   Continuous Glucose Receiver (FREESTYLE LIBRE 2 READER) DEVI, Use to monitor blood glucose continouously, Disp: 2 each, Rfl: 5   Continuous Glucose Sensor (FREESTYLE LIBRE 2 SENSOR) MISC, Use as directed, Disp: 2 each, Rfl: 5   glucose blood  (FREESTYLE TEST STRIPS) test strip, USE AS INSTRUCTED TO CHECK BLOOD SUGAR ONCE DAILY DX CODE E11.9, Disp: 50 each, Rfl: 0   levothyroxine (SYNTHROID, LEVOTHROID) 50 MCG tablet, TAKE 1 TABLET (50 MCG TOTAL) BY MOUTH DAILY BEFORE BREAKFAST., Disp: 30 tablet, Rfl: 5   metFORMIN (GLUCOPHAGE) 500 MG tablet, Take 500-1,000 mg by mouth See admin instructions. 500 mg at lunch, 1000 mg with dinner., Disp: , Rfl:    metoprolol succinate (TOPROL-XL) 25 MG 24 hr tablet, Take 1 tablet (25 mg total) by mouth daily., Disp: 30 tablet, Rfl: 3   nitroGLYCERIN (NITROSTAT) 0.4 MG SL tablet, Place 1 tablet (0.4 mg total) under the tongue every 5 (five) minutes x 3 doses as needed for chest pain., Disp: 25 tablet, Rfl: 0   Potassium Chloride ER 20 MEQ TBCR, Take 1 tablet (20 mEq total) by mouth daily., Disp: 90 tablet, Rfl: 1   potassium chloride SA (K-DUR,KLOR-CON) 20 MEQ tablet, Take 1 tablet (20 mEq total) by mouth daily. (Patient taking differently: Take 20 mEq by mouth daily with lunch.), Disp: 30 tablet, Rfl: 3   PROAIR HFA 108 (90 BASE) MCG/ACT inhaler, INHALE 1 TO 2 PUFFS BY MOUTH EVERY 6 HOURS AS NEEDED, Disp: , Rfl: 1   Study - LIBREXIA-ACS - milvexian 25 mg or placebo tablet (PI-Stuckey), Take 1 tablet by mouth 2 (two) times daily. Take with or without food. Bring bottle back to every visit. For Investigational Use Only. Please contact [facility] for any questions  or concerns regarding this medication., Disp: 210 tablet, Rfl: 0   ticagrelor (BRILINTA) 90 MG TABS tablet, Take 1 tablet (90 mg total) by mouth 2 (two) times daily., Disp: 60 tablet, Rfl: 3  Past Medical History: Past Medical History:  Diagnosis Date   Anxiety 11/16/2013   Hyperlipidemia 05/09/2013   Hypertension 01/27/2011   Hypokalemia 11/16/2013   Hypothyroidism 05/11/2013   Seasonal allergies 01/27/2011   Uterine cancer (HCC)     Tobacco Use: Social History   Tobacco Use  Smoking Status Never  Smokeless Tobacco Never     Labs: Review Flowsheet  More data exists      Latest Ref Rng & Units 05/15/2014 11/08/2014 05/28/2015 03/21/2023 03/22/2023  Labs for ITP Cardiac and Pulmonary Rehab  Cholestrol 0 - 200 mg/dL 244  - - - 010   LDL (calc) 0 - 99 mg/dL 84  - - - 93   HDL-C >27 mg/dL 25.36  - - - 59   Trlycerides <150 mg/dL 644.0  - - - 87   Hemoglobin A1c 4.8 - 5.6 % 6.6  6.6  6.5  7.0  -    Details            Capillary Blood Glucose: Lab Results  Component Value Date   GLUCAP 156 (H) 04/15/2023   GLUCAP 174 (H) 04/15/2023   GLUCAP 169 (H) 04/13/2023   GLUCAP 209 (H) 04/13/2023   GLUCAP 134 (H) 04/07/2023     Exercise Target Goals: Exercise Program Goal: Individual exercise prescription set using results from initial 6 min walk test and THRR while considering  patient's activity barriers and safety.   Exercise Prescription Goal: Initial exercise prescription builds to 30-45 minutes a day of aerobic activity, 2-3 days per week.  Home exercise guidelines will be given to patient during program as part of exercise prescription that the participant will acknowledge.  Activity Barriers & Risk Stratification:  Activity Barriers & Cardiac Risk Stratification - 04/07/23 1519       Activity Barriers & Cardiac Risk Stratification   Activity Barriers Deconditioning;Shortness of Breath;Neck/Spine Problems;Assistive Device;Other (comment)    Comments dizziness    Cardiac Risk Stratification High   <5 METs on            6 Minute Walk:  6 Minute Walk     Row Name 04/07/23 1517         6 Minute Walk   Phase Initial     Distance 480 feet     Walk Time 6 minutes     # of Rest Breaks 1  2:30-4:00 3/10 lightheadedness, resolved and pt continued. see note in ITP for details     MPH 0.9     METS 1.21     RPE 13     Perceived Dyspnea  1     VO2 Peak 4.23     Symptoms Yes (comment)     Comments 3/10 lightheadedness. VSS, resolved with seated rest break. RPD = 1, pt has allergy cough and  resolved with rest     Resting HR 96 bpm     Resting BP 108/68     Resting Oxygen Saturation  100 %     Exercise Oxygen Saturation  during 6 min walk 98 %     Max Ex. HR 112 bpm     Max Ex. BP 126/66     2 Minute Post BP 114/68  Oxygen Initial Assessment:   Oxygen Re-Evaluation:   Oxygen Discharge (Final Oxygen Re-Evaluation):   Initial Exercise Prescription:  Initial Exercise Prescription - 04/07/23 1500       Date of Initial Exercise RX and Referring Provider   Date 04/07/23    Referring Provider Nanetta Batty, MD    Expected Discharge Date 06/29/23      T5 Nustep   Level 1    SPM 50    Minutes 30    METs 1.5      Prescription Details   Frequency (times per week) 3    Duration Progress to 30 minutes of continuous aerobic without signs/symptoms of physical distress      Intensity   THRR 40-80% of Max Heartrate 57-114    Ratings of Perceived Exertion 11-13    Perceived Dyspnea 0-4      Progression   Progression Continue progressive overload as per policy without signs/symptoms or physical distress.      Resistance Training   Training Prescription Yes    Weight 2    Reps 10-15             Perform Capillary Blood Glucose checks as needed.  Exercise Prescription Changes:   Exercise Prescription Changes     Row Name 04/13/23 1644 04/29/23 1712           Response to Exercise   Blood Pressure (Admit) 104/58 104/66      Blood Pressure (Exercise) 120/60 134/72      Blood Pressure (Exit) 102/60 106/60      Heart Rate (Admit) 88 bpm 77 bpm      Heart Rate (Exercise) 91 bpm 94 bpm      Heart Rate (Exit) 81 bpm 79 bpm      Rating of Perceived Exertion (Exercise) 9 13      Perceived Dyspnea (Exercise) 0 0      Symptoms none none      Comments Pt first day in the [facility] ICR program Reviewed MET's, goals and home ExRx      Duration Progress to 30 minutes of  aerobic without signs/symptoms of physical distress Progress to 30 minutes  of  aerobic without signs/symptoms of physical distress      Intensity THRR unchanged THRR unchanged        Progression   Progression Continue to progress workloads to maintain intensity without signs/symptoms of physical distress. Continue to progress workloads to maintain intensity without signs/symptoms of physical distress.      Average METs 1.3 1.5        Resistance Training   Training Prescription No Yes      Weight -- 2 lbs wts      Reps -- 10-15      Time -- 10 Minutes        T5 Nustep   Level 1 1      SPM 25 50      Minutes 30 30      METs 1.3 1.5        Home Exercise Plan   Plans to continue exercise at -- Home (comment)      Frequency -- Add 4 additional days to program exercise sessions.      Initial Home Exercises Provided -- 04/29/23               Exercise Comments:   Exercise Comments     Row Name 04/13/23 1649 04/29/23 1717         Exercise Comments  Pt first day in the [facility] ICR program. Pt tolerated low intensity exercise well with an average MET level of 1.3. She is learning her THRR, RPE and ExRx. She is off to a good start. Will continue to increase workloads as tolerated without sign or symtpom Reviewed MET's, goals and home ExRx. Pt tolerated exercise well with an average MET level of 1.5. Pt feels good about her goals and is gaining strength and stamina, she is also feeling more confident at work and is able to get back to some of her ADL's like canning and cooking. Pt is walking and plans to add in a home stepper as able for 15-30 mins for 3-4 days a week.               Exercise Goals and Review:   Exercise Goals     Row Name 04/07/23 1342             Exercise Goals   Increase Physical Activity Yes       Intervention Provide advice, education, support and counseling about physical activity/exercise needs.;Develop an individualized exercise prescription for aerobic and resistive training based on initial evaluation findings, risk  stratification, comorbidities and participant's personal goals.       Expected Outcomes Short Term: Attend rehab on a regular basis to increase amount of physical activity.;Long Term: Exercising regularly at least 3-5 days a week.;Long Term: Add in home exercise to make exercise part of routine and to increase amount of physical activity.       Increase Strength and Stamina Yes       Intervention Provide advice, education, support and counseling about physical activity/exercise needs.;Develop an individualized exercise prescription for aerobic and resistive training based on initial evaluation findings, risk stratification, comorbidities and participant's personal goals.       Expected Outcomes Short Term: Increase workloads from initial exercise prescription for resistance, speed, and METs.;Short Term: Perform resistance training exercises routinely during rehab and add in resistance training at home;Long Term: Improve cardiorespiratory fitness, muscular endurance and strength as measured by increased METs and functional capacity ( )       Able to understand and use rate of perceived exertion (RPE) scale Yes       Intervention Provide education and explanation on how to use RPE scale       Expected Outcomes Short Term: Able to use RPE daily in rehab to express subjective intensity level;Long Term:  Able to use RPE to guide intensity level when exercising independently       Knowledge and understanding of Target Heart Rate Range (THRR) Yes       Intervention Provide education and explanation of THRR including how the numbers were predicted and where they are located for reference       Expected Outcomes Short Term: Able to state/look up THRR;Short Term: Able to use daily as guideline for intensity in rehab;Long Term: Able to use THRR to govern intensity when exercising independently       Understanding of Exercise Prescription Yes       Intervention Provide education, explanation, and written  materials on patient's individual exercise prescription       Expected Outcomes Short Term: Able to explain program exercise prescription;Long Term: Able to explain home exercise prescription to exercise independently                Exercise Goals Re-Evaluation :  Exercise Goals Re-Evaluation     Row Name 04/13/23 1646 04/29/23 1714  Exercise Goal Re-Evaluation   Exercise Goals Review Increase Physical Activity;Understanding of Exercise Prescription;Increase Strength and Stamina;Knowledge and understanding of Target Heart Rate Range (THRR);Able to understand and use rate of perceived exertion (RPE) scale Increase Physical Activity;Understanding of Exercise Prescription;Increase Strength and Stamina;Knowledge and understanding of Target Heart Rate Range (THRR);Able to understand and use rate of perceived exertion (RPE) scale      Comments Pt first day in the [facility] ICR program. Pt tolerated low intensity exercise well with an average MET level of 1.3. She is learning her THRR, RPE and ExRx. She is off to a good start. Reviewed MET's, goals and home ExRx. Pt tolerated exercise well with an average MET level of 1.5. Pt feels good about her goals and is gaining strength and stamina, she is also feeling more confident at work and is able to get back to some of her ADL's like canning and cooking. Pt is walking and plans to add in a home stepper as able for 15-30 mins for 3-4 days a week.      Expected Outcomes Will continue to increase workloads as tolerated without sign or symtpom Will continue to increase workloads as tolerated without sign or symtpom               Discharge Exercise Prescription (Final Exercise Prescription Changes):  Exercise Prescription Changes - 04/29/23 1712       Response to Exercise   Blood Pressure (Admit) 104/66    Blood Pressure (Exercise) 134/72    Blood Pressure (Exit) 106/60    Heart Rate (Admit) 77 bpm    Heart Rate (Exercise) 94 bpm     Heart Rate (Exit) 79 bpm    Rating of Perceived Exertion (Exercise) 13    Perceived Dyspnea (Exercise) 0    Symptoms none    Comments Reviewed MET's, goals and home ExRx    Duration Progress to 30 minutes of  aerobic without signs/symptoms of physical distress    Intensity THRR unchanged      Progression   Progression Continue to progress workloads to maintain intensity without signs/symptoms of physical distress.    Average METs 1.5      Resistance Training   Training Prescription Yes    Weight 2 lbs wts    Reps 10-15    Time 10 Minutes      T5 Nustep   Level 1    SPM 50    Minutes 30    METs 1.5      Home Exercise Plan   Plans to continue exercise at Home (comment)    Frequency Add 4 additional days to program exercise sessions.    Initial Home Exercises Provided 04/29/23             Nutrition:  Target Goals: Understanding of nutrition guidelines, daily intake of sodium 1500mg , cholesterol 200mg , calories 30% from fat and 7% or less from saturated fats, daily to have 5 or more servings of fruits and vegetables.  Biometrics:  Pre Biometrics - 04/07/23 1328       Pre Biometrics   Waist Circumference 42 inches    Hip Circumference 41 inches    Waist to Hip Ratio 1.02 %    Triceps Skinfold 27 mm    % Body Fat 41.1 %    Grip Strength 18 kg    Flexibility 0 in   not done due to back issues   Single Leg Stand 8.87 seconds  Nutrition Therapy Plan and Nutrition Goals:  Nutrition Therapy & Goals - 04/14/23 0924       Nutrition Therapy   Diet Heart Healthy Diet    Drug/Food Interactions Statins/Certain Fruits      Personal Nutrition Goals   Nutrition Goal Patient to identify strategies for reducing cardiovascular risk by attending the [facility] education and nutrition series weekly.    Personal Goal #2 Patient to improve diet quality by using the plate method as a guide for meal planning to include lean protein/plant protein, fruits,  vegetables, whole grains, nonfat dairy as part of a well-balanced diet.    Personal Goal #3 Patient to identify strategies for blood sugar control with goal A1c <7%    Comments Demelza reports that she has started making many dietary changes including reduced carbohydrate intake and switching to whole grains and higher fiber food choices. Her son is an excellent support and does much of the grcoery shopping and cooking. She is monitoring her blood sugar regularly and reports fasting/am blood sugars <120. She is down ~12# per documentation (175# on 09/17/2022) since making lifestyle changes. Lashina is eager to improve energy level and ultimately return back to work. Patient will benefit from participation in intensive cardiac rehab for nutrition, exercise, and lifestyle modification.      Intervention Plan   Intervention Prescribe, educate and counsel regarding individualized specific dietary modifications aiming towards targeted core components such as weight, hypertension, lipid management, diabetes, heart failure and other comorbidities.;Nutrition handout(s) given to patient.    Expected Outcomes Short Term Goal: Understand basic principles of dietary content, such as calories, fat, sodium, cholesterol and nutrients.;Long Term Goal: Adherence to prescribed nutrition plan.             Nutrition Assessments:  Nutrition Assessments - 04/14/23 0932       Rate Your Plate Scores   Pre Score 49            MEDIFICTS Score Key: ?70 Need to make dietary changes  40-70 Heart Healthy Diet ? 40 Therapeutic Level Cholesterol Diet   Flowsheet Row INTENSIVE CARDIAC REHAB from 04/13/2023 in [facility], [facility] Lung [facility]  Picture Your Plate Total Score on Admission 49      Picture Your Plate Scores: <29 Unhealthy dietary pattern with much room for improvement. 41-50 Dietary pattern unlikely to meet recommendations for good health and room for improvement. 51-60  More healthful dietary pattern, with some room for improvement.  >60 Healthy dietary pattern, although there may be some specific behaviors that could be improved.    Nutrition Goals Re-Evaluation:  Nutrition Goals Re-Evaluation     Row Name 04/14/23 0924             Goals   Current Weight 163 lb 9.3 oz (74.2 kg)       Comment lipids WNL, LipoproteinA WNL, A1c 7.0       Expected Outcome Anola reports that she has started making many dietary changes including reduced carbohydrate intake and switching to whole grains and higher fiber food choices. Her son is an excellent support and does much of the grcoery shopping and cooking. She is monitoring her blood sugar regularly and reports fasting/am blood sugars <120. She is down ~12# per documentation (175# on 09/17/2022) since making lifestyle changes. Hazelee is eager to improve energy level and ultimately return back to work. Patient will benefit from participation in intensive cardiac rehab for nutrition, exercise, and lifestyle modification.  Nutrition Goals Re-Evaluation:  Nutrition Goals Re-Evaluation     Row Name 04/14/23 0924             Goals   Current Weight 163 lb 9.3 oz (74.2 kg)       Comment lipids WNL, LipoproteinA WNL, A1c 7.0       Expected Outcome Mailah reports that she has started making many dietary changes including reduced carbohydrate intake and switching to whole grains and higher fiber food choices. Her son is an excellent support and does much of the grcoery shopping and cooking. She is monitoring her blood sugar regularly and reports fasting/am blood sugars <120. She is down ~12# per documentation (175# on 09/17/2022) since making lifestyle changes. Ramey is eager to improve energy level and ultimately return back to work. Patient will benefit from participation in intensive cardiac rehab for nutrition, exercise, and lifestyle modification.                Nutrition Goals Discharge (Final Nutrition  Goals Re-Evaluation):  Nutrition Goals Re-Evaluation - 04/14/23 0924       Goals   Current Weight 163 lb 9.3 oz (74.2 kg)    Comment lipids WNL, LipoproteinA WNL, A1c 7.0    Expected Outcome Antonita reports that she has started making many dietary changes including reduced carbohydrate intake and switching to whole grains and higher fiber food choices. Her son is an excellent support and does much of the grcoery shopping and cooking. She is monitoring her blood sugar regularly and reports fasting/am blood sugars <120. She is down ~12# per documentation (175# on 09/17/2022) since making lifestyle changes. Zianna is eager to improve energy level and ultimately return back to work. Patient will benefit from participation in intensive cardiac rehab for nutrition, exercise, and lifestyle modification.             Psychosocial: Target Goals: Acknowledge presence or absence of significant depression and/or stress, maximize coping skills, provide positive support system. Participant is able to verbalize types and ability to use techniques and skills needed for reducing stress and depression.  Initial Review & Psychosocial Screening:  Initial Psych Review & Screening - 04/07/23 1524       Initial Review   Current issues with Current Stress Concerns    Source of Stress Concerns Unable to perform yard/household activities;Unable to participate in former interests or hobbies;Occupation    Comments Relena denies any feelings of anxiety or depression, however Rylee shared that she has not felt comfortable resuming her hobbies, such as Geophysical data processor and housework, since her heart attack. She is eager to return to these activities, but shared that she is stressed because cannot do them or go to work. She is hoping to return to work next week. Sophia also shared that her job can be stressful at times as well. Support offered, additional resources denied.      Family Dynamics   Good Support System? Yes   Evalyn Casco has her son  and daughter for support     Barriers   Psychosocial barriers to participate in program The patient should benefit from training in stress management and relaxation.      Screening Interventions   Interventions Provide feedback about the scores to participant;Encouraged to exercise;To provide support and resources with identified psychosocial needs    Expected Outcomes Long Term Goal: Stressors or current issues are controlled or eliminated.;Short Term goal: Identification and review with participant of any Quality of Life or Depression concerns found by scoring the questionnaire.;Long  Term goal: The participant improves quality of Life and PHQ9 Scores as seen by post scores and/or verbalization of changes             Quality of Life Scores:  Quality of Life - 04/07/23 1527       Quality of Life   Select Quality of Life      Quality of Life Scores   Health/Function Pre 21.75 %    Socioeconomic Pre 21.86 %    Psych/Spiritual Pre 21.71 %    Family Pre 27 %    GLOBAL Pre 22.42 %            Scores of 19 and below usually indicate a poorer quality of life in these areas.  A difference of  2-3 points is a clinically meaningful difference.  A difference of 2-3 points in the total score of the Quality of Life Index has been associated with significant improvement in overall quality of life, self-image, physical symptoms, and general health in studies assessing change in quality of life.  PHQ-9: Review Flowsheet       04/07/2023  Depression screen PHQ 2/9  Decreased Interest 0  Down, Depressed, Hopeless 0  PHQ - 2 Score 0  Altered sleeping 2  Tired, decreased energy 2  Change in appetite 1  Feeling bad or failure about yourself  1  Trouble concentrating 0  Moving slowly or fidgety/restless 0  Suicidal thoughts 0  PHQ-9 Score 6  Difficult doing work/chores --    Details           Interpretation of Total Score  Total Score Depression Severity:  1-4 = Minimal  depression, 5-9 = Mild depression, 10-14 = Moderate depression, 15-19 = Moderately severe depression, 20-27 = Severe depression   Psychosocial Evaluation and Intervention:   Psychosocial Re-Evaluation:  Psychosocial Re-Evaluation     Row Name 04/13/23 1648 05/10/23 1410           Psychosocial Re-Evaluation   Current issues with Current Stress Concerns Current Stress Concerns      Comments Chrissy says she still feel weak due her recent cardiac procedure. Emotional support provided Claramae has not voiced any increased concerns or stressors at cardiac rehab. Markaya continues to work part time      Expected Outcomes Tonnie will have decreased or controlled stress upon completion of cardiac rehab Kaiyla will have decreased or controlled stress upon completion of cardiac rehab      Interventions Encouraged to attend Cardiac Rehabilitation for the exercise;Stress management education;Relaxation education Encouraged to attend Cardiac Rehabilitation for the exercise;Stress management education;Relaxation education      Continue Psychosocial Services  Follow up required by staff Follow up required by staff        Initial Review   Source of Stress Concerns Unable to participate in former interests or hobbies;Unable to perform yard/household activities;Chronic Illness;Occupation Unable to participate in former interests or hobbies;Unable to perform yard/household activities;Chronic Illness;Occupation      Comments will continue to monitor and offer support as needed. will continue to monitor and offer support as needed.               Psychosocial Discharge (Final Psychosocial Re-Evaluation):  Psychosocial Re-Evaluation - 05/10/23 1410       Psychosocial Re-Evaluation   Current issues with Current Stress Concerns    Comments Bobi has not voiced any increased concerns or stressors at cardiac rehab. Sunshyne continues to work part time    Expected Outcomes Jazmarie will  have decreased or controlled stress upon  completion of cardiac rehab    Interventions Encouraged to attend Cardiac Rehabilitation for the exercise;Stress management education;Relaxation education    Continue Psychosocial Services  Follow up required by staff      Initial Review   Source of Stress Concerns Unable to participate in former interests or hobbies;Unable to perform yard/household activities;Chronic Illness;Occupation    Comments will continue to monitor and offer support as needed.             Vocational Rehabilitation: Provide vocational rehab assistance to qualifying candidates.   Vocational Rehab Evaluation & Intervention:  Vocational Rehab - 04/07/23 1343       Initial Vocational Rehab Evaluation & Intervention   Assessment shows need for Vocational Rehabilitation No   Emma is working            Education: Education Goals: Education classes will be provided on a weekly basis, covering required topics. Participant will state understanding/return demonstration of topics presented.    Education     Row Name 04/13/23 1600     Education   Cardiac Education Topics Pritikin   Customer service manager   Weekly Topic International Cuisine- Spotlight on the [employer] Zones   Instruction Review Code 1- Verbalizes Understanding   Class Start Time 1400   Class Stop Time 1448   Class Time Calculation (min) 48 min    Row Name 04/15/23 1500     Education   Cardiac Education Topics Pritikin   Psychologist, forensic Exercise Education   Exercise Education Improving Performance   Instruction Review Code 1- Verbalizes Understanding   Class Start Time 1400   Class Stop Time 1455   Class Time Calculation (min) 55 min    Row Name 04/20/23 1500     Education   Cardiac Education Topics Pritikin   Customer service manager   Weekly Topic Simple Sides and Sauces   Instruction  Review Code 1- Verbalizes Understanding   Class Start Time 1400   Class Stop Time 1445   Class Time Calculation (min) 45 min    Row Name 04/27/23 1500     Education   Cardiac Education Topics Pritikin   Customer service manager   Weekly Topic Powerhouse Plant-Based Proteins   Instruction Review Code 1- Verbalizes Understanding   Class Start Time 1400   Class Stop Time 1440   Class Time Calculation (min) 40 min    Row Name 04/29/23 1400     Education   Cardiac Education Topics Pritikin   Hospital doctor Education   General Education Hypertension and Heart Disease   Instruction Review Code 1- Verbalizes Understanding   Class Start Time 1410   Class Stop Time 1445   Class Time Calculation (min) 35 min    Row Name 05/04/23 1500     Education   Cardiac Education Topics Pritikin   Customer service manager   Weekly Topic Adding Flavor - Sodium-Free   Instruction Review Code 1- Verbalizes Understanding   Class Start Time 1400   Class Stop Time 1437   Class Time Calculation (  min) 37 min    Row Name 05/06/23 1400     Education   Cardiac Education Topics Pritikin   Hospital doctor Education   General Education Heart Disease Risk Reduction   Instruction Review Code 1- Verbalizes Understanding   Class Start Time 1355   Class Stop Time 1436   Class Time Calculation (min) 41 min            Core Videos: Exercise    Move It!  Clinical staff conducted group or individual video education with verbal and written material and guidebook.  Patient learns the recommended Pritikin exercise program. Exercise with the goal of living a long, healthy life. Some of the health benefits of exercise include controlled diabetes, healthier blood pressure levels, improved  cholesterol levels, improved heart and lung capacity, improved sleep, and better body composition. Everyone should speak with their doctor before starting or changing an exercise routine.  Biomechanical Limitations Clinical staff conducted group or individual video education with verbal and written material and guidebook.  Patient learns how biomechanical limitations can impact exercise and how we can mitigate and possibly overcome limitations to have an impactful and balanced exercise routine.  Body Composition Clinical staff conducted group or individual video education with verbal and written material and guidebook.  Patient learns that body composition (ratio of muscle mass to fat mass) is a key component to assessing overall fitness, rather than body weight alone. Increased fat mass, especially visceral belly fat, can put [place] at increased risk for metabolic syndrome, type 2 diabetes, heart disease, and even death. It is recommended to combine diet and exercise (cardiovascular and resistance training) to improve your body composition. Seek guidance from your physician and exercise physiologist before implementing an exercise routine.  Exercise Action Plan Clinical staff conducted group or individual video education with verbal and written material and guidebook.  Patient learns the recommended strategies to achieve and enjoy long-term exercise adherence, including variety, self-motivation, self-efficacy, and positive decision making. Benefits of exercise include fitness, good health, weight management, more energy, better sleep, less stress, and overall well-being.  Medical   Heart Disease Risk Reduction Clinical staff conducted group or individual video education with verbal and written material and guidebook.  Patient learns our heart is our most vital organ as it circulates oxygen, nutrients, white blood cells, and hormones throughout the entire body, and carries waste away. Data supports a  plant-based eating plan like the Pritikin Program for its effectiveness in slowing progression of and reversing heart disease. The video provides a number of recommendations to address heart disease.   Metabolic Syndrome and Belly Fat  Clinical staff conducted group or individual video education with verbal and written material and guidebook.  Patient learns what metabolic syndrome is, how it leads to heart disease, and how one can reverse it and keep it from coming back. You have metabolic syndrome if you have 3 of the following 5 criteria: abdominal obesity, high blood pressure, high triglycerides, low HDL cholesterol, and high blood sugar.  Hypertension and Heart Disease Clinical staff conducted group or individual video education with verbal and written material and guidebook.  Patient learns that high blood pressure, or hypertension, is very common in the [place]. Hypertension is largely due to excessive salt intake, but other important risk factors include being overweight, physical inactivity, drinking too much alcohol, smoking, and not eating enough potassium from fruits and vegetables.  High blood pressure is a leading risk factor for heart attack, stroke, congestive heart failure, dementia, kidney failure, and premature death. Long-term effects of excessive salt intake include stiffening of the arteries and thickening of heart muscle and organ damage. Recommendations include ways to reduce hypertension and the risk of heart disease.  Diseases of Our Time - Focusing on Diabetes Clinical staff conducted group or individual video education with verbal and written material and guidebook.  Patient learns why the best way to stop diseases of our time is prevention, through food and other lifestyle changes. Medicine (such as prescription pills and surgeries) is often only a Band-Aid on the problem, not a long-term solution. Most common diseases of our time include obesity, type 2 diabetes,  hypertension, heart disease, and cancer. The Pritikin Program is recommended and has been proven to help reduce, reverse, and/or prevent the damaging effects of metabolic syndrome.  Nutrition   Overview of the Pritikin Eating Plan  Clinical staff conducted group or individual video education with verbal and written material and guidebook.  Patient learns about the Pritikin Eating Plan for disease risk reduction. The Pritikin Eating Plan emphasizes a wide variety of unrefined, minimally-processed carbohydrates, like fruits, vegetables, whole grains, and legumes. Go, Caution, and Stop food choices are explained. Plant-based and lean animal proteins are emphasized. Rationale provided for low sodium intake for blood pressure control, low added sugars for blood sugar stabilization, and low added fats and oils for coronary artery disease risk reduction and weight management.  Calorie Density  Clinical staff conducted group or individual video education with verbal and written material and guidebook.  Patient learns about calorie density and how it impacts the Pritikin Eating Plan. Knowing the characteristics of the food you choose will help you decide whether those foods will lead to weight gain or weight loss, and whether you want to consume more or less of them. Weight loss is usually a side effect of the Pritikin Eating Plan because of its focus on low calorie-dense foods.  Label Reading  Clinical staff conducted group or individual video education with verbal and written material and guidebook.  Patient learns about the Pritikin recommended label reading guidelines and corresponding recommendations regarding calorie density, added sugars, sodium content, and whole grains.  Dining Out - Part 1  Clinical staff conducted group or individual video education with verbal and written material and guidebook.  Patient learns that restaurant meals can be sabotaging because they can be so high in calories, fat,  sodium, and/or sugar. Patient learns recommended strategies on how to positively address this and avoid unhealthy pitfalls.  Facts on Fats  Clinical staff conducted group or individual video education with verbal and written material and guidebook.  Patient learns that lifestyle modifications can be just as effective, if not more so, as many medications for lowering your risk of heart disease. A Pritikin lifestyle can help to reduce your risk of inflammation and atherosclerosis (cholesterol build-up, or plaque, in the artery walls). Lifestyle interventions such as dietary choices and physical activity address the cause of atherosclerosis. A review of the types of fats and their impact on blood cholesterol levels, along with dietary recommendations to reduce fat intake is also included.  Nutrition Action Plan  Clinical staff conducted group or individual video education with verbal and written material and guidebook.  Patient learns how to incorporate Pritikin recommendations into their lifestyle. Recommendations include planning and keeping personal health goals in mind as an important part of their success.  Healthy  Mind-Set    Healthy Minds, Bodies, Hearts  Clinical staff conducted group or individual video education with verbal and written material and guidebook.  Patient learns how to identify when they are stressed. Video will discuss the impact of that stress, as well as the many benefits of stress management. Patient will also be introduced to stress management techniques. The way we think, act, and feel has an impact on our hearts.  How Our Thoughts Can Heal Our Hearts  Clinical staff conducted group or individual video education with verbal and written material and guidebook.  Patient learns that negative thoughts can cause depression and anxiety. This can result in negative lifestyle behavior and serious health problems. Cognitive behavioral therapy is an effective method to help control  our thoughts in order to change and improve our emotional outlook.  Additional Videos:  Exercise    Improving Performance  Clinical staff conducted group or individual video education with verbal and written material and guidebook.  Patient learns to use a non-linear approach by alternating intensity levels and lengths of time spent exercising to help burn more calories and lose more body fat. Cardiovascular exercise helps improve heart health, metabolism, hormonal balance, blood sugar control, and recovery from fatigue. Resistance training improves strength, endurance, balance, coordination, reaction time, metabolism, and muscle mass. Flexibility exercise improves circulation, posture, and balance. Seek guidance from your physician and exercise physiologist before implementing an exercise routine and learn your capabilities and proper form for all exercise.  Introduction to Yoga  Clinical staff conducted group or individual video education with verbal and written material and guidebook.  Patient learns about yoga, a discipline of the coming together of mind, breath, and body. The benefits of yoga include improved flexibility, improved range of motion, better posture and core strength, increased lung function, weight loss, and positive self-image. Yoga's heart health benefits include lowered blood pressure, healthier heart rate, decreased cholesterol and triglyceride levels, improved immune function, and reduced stress. Seek guidance from your physician and exercise physiologist before implementing an exercise routine and learn your capabilities and proper form for all exercise.  Medical   Aging: Enhancing Your Quality of Life  Clinical staff conducted group or individual video education with verbal and written material and guidebook.  Patient learns key strategies and recommendations to stay in good physical health and enhance quality of life, such as prevention strategies, having an advocate,  securing a Health Care Proxy and Power of Attorney, and keeping a list of medications and system for tracking them. It also discusses how to avoid risk for bone loss.  Biology of Weight Control  Clinical staff conducted group or individual video education with verbal and written material and guidebook.  Patient learns that weight gain occurs because we consume more calories than we burn (eating more, moving less). Even if your body weight is normal, you may have higher ratios of fat compared to muscle mass. Too much body fat puts you at increased risk for cardiovascular disease, heart attack, stroke, type 2 diabetes, and obesity-related cancers. In addition to exercise, following the Pritikin Eating Plan can help reduce your risk.  Decoding Lab Results  Clinical staff conducted group or individual video education with verbal and written material and guidebook.  Patient learns that lab test reflects one measurement whose values change over time and are influenced by many factors, including medication, stress, sleep, exercise, food, hydration, pre-existing medical conditions, and more. It is recommended to use the knowledge from this video to become more involved  with your lab results and evaluate your numbers to speak with your doctor.   Diseases of Our Time - Overview  Clinical staff conducted group or individual video education with verbal and written material and guidebook.  Patient learns that according to the CDC, 50% to 70% of chronic diseases (such as obesity, type 2 diabetes, elevated lipids, hypertension, and heart disease) are avoidable through lifestyle improvements including healthier food choices, listening to satiety cues, and increased physical activity.  Sleep Disorders Clinical staff conducted group or individual video education with verbal and written material and guidebook.  Patient learns how good quality and duration of sleep are important to overall health and well-being.  Patient also learns about sleep disorders and how they impact health along with recommendations to address them, including discussing with a physician.  Nutrition  Dining Out - Part 2 Clinical staff conducted group or individual video education with verbal and written material and guidebook.  Patient learns how to plan ahead and communicate in order to maximize their dining experience in a healthy and nutritious manner. Included are recommended food choices based on the type of restaurant the patient is visiting.   Fueling a Banker conducted group or individual video education with verbal and written material and guidebook.  There is a strong connection between our food choices and our health. Diseases like obesity and type 2 diabetes are very prevalent and are in large-part due to lifestyle choices. The Pritikin Eating Plan provides plenty of food and hunger-curbing satisfaction. It is easy to follow, affordable, and helps reduce health risks.  Menu Workshop  Clinical staff conducted group or individual video education with verbal and written material and guidebook.  Patient learns that restaurant meals can sabotage health goals because they are often packed with calories, fat, sodium, and sugar. Recommendations include strategies to plan ahead and to communicate with the manager, chef, or server to help order a healthier meal.  Planning Your Eating Strategy  Clinical staff conducted group or individual video education with verbal and written material and guidebook.  Patient learns about the Pritikin Eating Plan and its benefit of reducing the risk of disease. The Pritikin Eating Plan does not focus on calories. Instead, it emphasizes high-quality, nutrient-rich foods. By knowing the characteristics of the foods, we choose, we can determine their calorie density and make informed decisions.  Targeting Your Nutrition Priorities  Clinical staff conducted group or individual  video education with verbal and written material and guidebook.  Patient learns that lifestyle habits have a tremendous impact on disease risk and progression. This video provides eating and physical activity recommendations based on your personal health goals, such as reducing LDL cholesterol, losing weight, preventing or controlling type 2 diabetes, and reducing high blood pressure.  Vitamins and Minerals  Clinical staff conducted group or individual video education with verbal and written material and guidebook.  Patient learns different ways to obtain key vitamins and minerals, including through a recommended healthy diet. It is important to discuss all supplements you take with your doctor.   Healthy Mind-Set    Smoking Cessation  Clinical staff conducted group or individual video education with verbal and written material and guidebook.  Patient learns that cigarette smoking and tobacco addiction pose a serious health risk which affects millions of people. Stopping smoking will significantly reduce the risk of heart disease, lung disease, and many forms of cancer. Recommended strategies for quitting are covered, including working with your doctor to develop a successful  plan.  Culinary   Becoming a Pritikin Chef  Clinical staff conducted group or individual video education with verbal and written material and guidebook.  Patient learns that cooking at home can be healthy, cost-effective, quick, and puts them in control. Keys to cooking healthy recipes will include looking at your recipe, assessing your equipment needs, planning ahead, making it simple, choosing cost-effective seasonal ingredients, and limiting the use of added fats, salts, and sugars.  Cooking - Breakfast and Snacks  Clinical staff conducted group or individual video education with verbal and written material and guidebook.  Patient learns how important breakfast is to satiety and nutrition through the entire day.  Recommendations include key foods to eat during breakfast to help stabilize blood sugar levels and to prevent overeating at meals later in the day. Planning ahead is also a key component.  Cooking - Educational psychologist conducted group or individual video education with verbal and written material and guidebook.  Patient learns eating strategies to improve overall health, including an approach to cook more at home. Recommendations include thinking of animal protein as a side on your plate rather than center stage and focusing instead on lower calorie dense options like vegetables, fruits, whole grains, and plant-based proteins, such as beans. Making sauces in large quantities to freeze for later and leaving the skin on your vegetables are also recommended to maximize your experience.  Cooking - Healthy Salads and Dressing Clinical staff conducted group or individual video education with verbal and written material and guidebook.  Patient learns that vegetables, fruits, whole grains, and legumes are the foundations of the Pritikin Eating Plan. Recommendations include how to incorporate each of these in flavorful and healthy salads, and how to create homemade salad dressings. Proper handling of ingredients is also covered. Cooking - Soups and [employer] - Soups and Desserts Clinical staff conducted group or individual video education with verbal and written material and guidebook.  Patient learns that Pritikin soups and desserts make for easy, nutritious, and delicious snacks and meal components that are low in sodium, fat, sugar, and calorie density, while high in vitamins, minerals, and filling fiber. Recommendations include simple and healthy ideas for soups and desserts.   Overview     The Pritikin Solution Program Overview Clinical staff conducted group or individual video education with verbal and written material and guidebook.  Patient learns that the results of the Pritikin  Program have been documented in more than 100 articles published in peer-reviewed journals, and the benefits include reducing risk factors for (and, in some cases, even reversing) high cholesterol, high blood pressure, type 2 diabetes, obesity, and more! An overview of the three key pillars of the Pritikin Program will be covered: eating well, doing regular exercise, and having a healthy mind-set.  WORKSHOPS  Exercise: Exercise Basics: Building Your Action Plan Clinical staff led group instruction and group discussion with PowerPoint presentation and patient guidebook. To enhance the learning environment the use of posters, models and videos may be added. At the conclusion of this workshop, patients will comprehend the difference between physical activity and exercise, as well as the benefits of incorporating both, into their routine. Patients will understand the FITT (Frequency, Intensity, Time, and Type) principle and how to use it to build an exercise action plan. In addition, safety concerns and other considerations for exercise and cardiac rehab will be addressed by the presenter. The purpose of this lesson is to promote a comprehensive and effective weekly exercise  routine in order to improve patients' overall level of fitness.   Managing Heart Disease: Your Path to a Healthier Heart Clinical staff led group instruction and group discussion with PowerPoint presentation and patient guidebook. To enhance the learning environment the use of posters, models and videos may be added.At the conclusion of this workshop, patients will understand the anatomy and physiology of the heart. Additionally, they will understand how Pritikin's three pillars impact the risk factors, the progression, and the management of heart disease.  The purpose of this lesson is to provide a high-level overview of the heart, heart disease, and how the Pritikin lifestyle positively impacts risk factors.  Exercise  Biomechanics Clinical staff led group instruction and group discussion with PowerPoint presentation and patient guidebook. To enhance the learning environment the use of posters, models and videos may be added. Patients will learn how the structural parts of their bodies function and how these functions impact their daily activities, movement, and exercise. Patients will learn how to promote a neutral spine, learn how to manage pain, and identify ways to improve their physical movement in order to promote healthy living. The purpose of this lesson is to expose patients to common physical limitations that impact physical activity. Participants will learn practical ways to adapt and manage aches and pains, and to minimize their effect on regular exercise. Patients will learn how to maintain good posture while sitting, walking, and lifting.  Balance Training and Fall Prevention  Clinical staff led group instruction and group discussion with PowerPoint presentation and patient guidebook. To enhance the learning environment the use of posters, models and videos may be added. At the conclusion of this workshop, patients will understand the importance of their sensorimotor skills (vision, proprioception, and the vestibular system) in maintaining their ability to balance as they age. Patients will apply a variety of balancing exercises that are appropriate for their current level of function. Patients will understand the common causes for poor balance, possible solutions to these problems, and ways to modify their physical environment in order to minimize their fall risk. The purpose of this lesson is to teach patients about the importance of maintaining balance as they age and ways to minimize their risk of falling.  WORKSHOPS   Nutrition:  Fueling a Ship broker led group instruction and group discussion with PowerPoint presentation and patient guidebook. To enhance the learning  environment the use of posters, models and videos may be added. Patients will review the foundational principles of the Pritikin Eating Plan and understand what constitutes a serving size in each of the food groups. Patients will also learn Pritikin-friendly foods that are better choices when away from home and review make-ahead meal and snack options. Calorie density will be reviewed and applied to three nutrition priorities: weight maintenance, weight loss, and weight gain. The purpose of this lesson is to reinforce (in a group setting) the key concepts around what patients are recommended to eat and how to apply these guidelines when away from home by planning and selecting Pritikin-friendly options. Patients will understand how calorie density may be adjusted for different weight management goals.  Mindful Eating  Clinical staff led group instruction and group discussion with PowerPoint presentation and patient guidebook. To enhance the learning environment the use of posters, models and videos may be added. Patients will briefly review the concepts of the Pritikin Eating Plan and the importance of low-calorie dense foods. The concept of mindful eating will be introduced as well as the importance  of paying attention to internal hunger signals. Triggers for non-hunger eating and techniques for dealing with triggers will be explored. The purpose of this lesson is to provide patients with the opportunity to review the basic principles of the Pritikin Eating Plan, discuss the value of eating mindfully and how to measure internal cues of hunger and fullness using the Hunger Scale. Patients will also discuss reasons for non-hunger eating and learn strategies to use for controlling emotional eating.  Targeting Your Nutrition Priorities Clinical staff led group instruction and group discussion with PowerPoint presentation and patient guidebook. To enhance the learning environment the use of posters, models and  videos may be added. Patients will learn how to determine their genetic susceptibility to disease by reviewing their family history. Patients will gain insight into the importance of diet as part of an overall healthy lifestyle in mitigating the impact of genetics and other environmental insults. The purpose of this lesson is to provide patients with the opportunity to assess their personal nutrition priorities by looking at their family history, their own health history and current risk factors. Patients will also be able to discuss ways of prioritizing and modifying the Pritikin Eating Plan for their highest risk areas  Menu  Clinical staff led group instruction and group discussion with PowerPoint presentation and patient guidebook. To enhance the learning environment the use of posters, models and videos may be added. Using menus brought in from [employer], or printed from [employer], patients will apply the Pritikin dining out guidelines that were presented in the [employer] video. Patients will also be able to practice these guidelines in a variety of provided scenarios. The purpose of this lesson is to provide patients with the opportunity to practice hands-on learning of the Pritikin Dining Out guidelines with actual menus and practice scenarios.  Label Reading Clinical staff led group instruction and group discussion with PowerPoint presentation and patient guidebook. To enhance the learning environment the use of posters, models and videos may be added. Patients will review and discuss the Pritikin label reading guidelines presented in Pritikin's Label Reading Educational series video. Using fool labels brought in from local grocery stores and markets, patients will apply the label reading guidelines and determine if the packaged food meet the Pritikin guidelines. The purpose of this lesson is to provide patients with the opportunity to review, discuss, and practice  hands-on learning of the Pritikin Label Reading guidelines with actual packaged food labels. Cooking School  Pritikin's [employer] are designed to teach patients ways to prepare quick, simple, and affordable recipes at home. The importance of nutrition's role in chronic disease risk reduction is reflected in its emphasis in the overall Pritikin program. By learning how to prepare essential core Pritikin Eating Plan recipes, patients will increase control over what they eat; be able to customize the flavor of foods without the use of added salt, sugar, or fat; and improve the quality of the food they consume. By learning a set of core recipes which are easily assembled, quickly prepared, and affordable, patients are more likely to prepare more healthy foods at home. These workshops focus on convenient breakfasts, simple entres, side dishes, and desserts which can be prepared with minimal effort and are consistent with nutrition recommendations for cardiovascular risk reduction. Cooking [employer] are taught by a Armed forces logistics/support/administrative officer (RD) who has been trained by the [employer]. The chef or RD has a clear understanding of the importance of minimizing -  if not completely eliminating - added fat, sugar, and sodium in recipes. Throughout the series of Cooking School Workshop sessions, patients will learn about healthy ingredients and efficient methods of cooking to build confidence in their capability to prepare    Cooking School weekly topics:  Adding Flavor- Sodium-Free  Fast and Healthy Breakfasts  Powerhouse Plant-Based Proteins  Satisfying Salads and Dressings  Simple Sides and Sauces  International Cuisine-Spotlight on the [employer] Zones  Delicious Desserts  Savory Soups  [employer] - Meals in a Astronomer Appetizers and Snacks  Comforting Weekend Breakfasts  One-Pot Wonders   Fast Evening Meals  Landscape architect Your Pritikin  Plate  WORKSHOPS   Healthy Mindset (Psychosocial):  Focused Goals, Sustainable Changes Clinical staff led group instruction and group discussion with PowerPoint presentation and patient guidebook. To enhance the learning environment the use of posters, models and videos may be added. Patients will be able to apply effective goal setting strategies to establish at least one personal goal, and then take consistent, meaningful action toward that goal. They will learn to identify common barriers to achieving personal goals and develop strategies to overcome them. Patients will also gain an understanding of how our mind-set can impact our ability to achieve goals and the importance of cultivating a positive and growth-oriented mind-set. The purpose of this lesson is to provide patients with a deeper understanding of how to set and achieve personal goals, as well as the tools and strategies needed to overcome common obstacles which may arise along the way.  From Head to Heart: The Power of a Healthy Outlook  Clinical staff led group instruction and group discussion with PowerPoint presentation and patient guidebook. To enhance the learning environment the use of posters, models and videos may be added. Patients will be able to recognize and describe the impact of emotions and mood on physical health. They will discover the importance of self-care and explore self-care practices which may work for them. Patients will also learn how to utilize the 4 C's to cultivate a healthier outlook and better manage stress and challenges. The purpose of this lesson is to demonstrate to patients how a healthy outlook is an essential part of maintaining good health, especially as they continue their cardiac rehab journey.  Healthy Sleep for a Healthy Heart Clinical staff led group instruction and group discussion with PowerPoint presentation and patient guidebook. To enhance the learning environment the use of posters,  models and videos may be added. At the conclusion of this workshop, patients will be able to demonstrate knowledge of the importance of sleep to overall health, well-being, and quality of life. They will understand the symptoms of, and treatments for, common sleep disorders. Patients will also be able to identify daytime and nighttime behaviors which impact sleep, and they will be able to apply these tools to help manage sleep-related challenges. The purpose of this lesson is to provide patients with a general overview of sleep and outline the importance of quality sleep. Patients will learn about a few of the most common sleep disorders. Patients will also be introduced to the concept of "sleep hygiene," and discover ways to self-manage certain sleeping problems through simple daily behavior changes. Finally, the workshop will motivate patients by clarifying the links between quality sleep and their goals of heart-healthy living.   Recognizing and Reducing Stress Clinical staff led group instruction and group discussion with PowerPoint presentation and patient guidebook. To enhance the learning environment the use of posters,  models and videos may be added. At the conclusion of this workshop, patients will be able to understand the types of stress reactions, differentiate between acute and chronic stress, and recognize the impact that chronic stress has on their health. They will also be able to apply different coping mechanisms, such as reframing negative self-talk. Patients will have the opportunity to practice a variety of stress management techniques, such as deep abdominal breathing, progressive muscle relaxation, and/or guided imagery.  The purpose of this lesson is to educate patients on the role of stress in their lives and to provide healthy techniques for coping with it.  Learning Barriers/Preferences:  Learning Barriers/Preferences - 04/07/23 1354       Learning Barriers/Preferences   Learning  Barriers Sight   wears glasses   Learning Preferences Audio;Group Instruction;Individual Instruction;Pictoral;Skilled Demonstration;Verbal Instruction;Video;Written Material             Education Topics:  Knowledge Questionnaire Score:  Knowledge Questionnaire Score - 04/07/23 1352       Knowledge Questionnaire Score   Pre Score 18/24             Core Components/Risk Factors/Patient Goals at Admission:  Personal Goals and Risk Factors at Admission - 04/07/23 1352       Core Components/Risk Factors/Patient Goals on Admission    Weight Management Yes;Weight Loss    Intervention Weight Management: Develop a combined nutrition and exercise program designed to reach desired caloric intake, while maintaining appropriate intake of nutrient and fiber, sodium and fats, and appropriate energy expenditure required for the weight goal.;Weight Management: Provide education and appropriate resources to help participant work on and attain dietary goals.    Expected Outcomes Short Term: Continue to assess and modify interventions until short term weight is achieved;Long Term: Adherence to nutrition and physical activity/exercise program aimed toward attainment of established weight goal;Weight Loss: Understanding of general recommendations for a balanced deficit meal plan, which promotes 1-2 lb weight loss per week and includes a negative energy balance of [phone] kcal/d;Understanding recommendations for meals to include 15-35% energy as protein, 25-35% energy from fat, 35-60% energy from carbohydrates, less than 200mg  of dietary cholesterol, 20-35 gm of total fiber daily;Understanding of distribution of calorie intake throughout the day with the consumption of 4-5 meals/snacks    Diabetes Yes    Intervention Provide education about signs/symptoms and action to take for hypo/hyperglycemia.;Provide education about proper nutrition, including hydration, and aerobic/resistive exercise prescription  along with prescribed medications to achieve blood glucose in normal ranges: Fasting glucose 65-99 mg/dL    Expected Outcomes Short Term: Participant verbalizes understanding of the signs/symptoms and immediate care of hyper/hypoglycemia, proper foot care and importance of medication, aerobic/resistive exercise and nutrition plan for blood glucose control.;Long Term: Attainment of HbA1C < 7%.    Hypertension Yes    Intervention Provide education on lifestyle modifcations including regular physical activity/exercise, weight management, moderate sodium restriction and increased consumption of fresh fruit, vegetables, and low fat dairy, alcohol moderation, and smoking cessation.;Monitor prescription use compliance.    Expected Outcomes Short Term: Continued assessment and intervention until BP is < 140/39mm HG in hypertensive participants. < 130/15mm HG in hypertensive participants with diabetes, heart failure or chronic kidney disease.;Long Term: Maintenance of blood pressure at goal levels.    Lipids Yes    Intervention Provide education and support for participant on nutrition & aerobic/resistive exercise along with prescribed medications to achieve LDL 70mg , HDL >40mg .    Expected Outcomes Short Term: Participant states understanding of desired  cholesterol values and is compliant with medications prescribed. Participant is following exercise prescription and nutrition guidelines.;Long Term: Cholesterol controlled with medications as prescribed, with individualized exercise RX and with personalized nutrition plan. Value goals: LDL < 70mg , HDL > 40 mg.    Stress Yes    Intervention Offer individual and/or small group education and counseling on adjustment to heart disease, stress management and health-related lifestyle change. Teach and support self-help strategies.;Refer participants experiencing significant psychosocial distress to appropriate mental health specialists for further evaluation and  treatment. When possible, include family members and significant others in education/counseling sessions.    Expected Outcomes Short Term: Participant demonstrates changes in health-related behavior, relaxation and other stress management skills, ability to obtain effective social support, and compliance with psychotropic medications if prescribed.;Long Term: Emotional wellbeing is indicated by absence of clinically significant psychosocial distress or social isolation.             Core Components/Risk Factors/Patient Goals Review:   Goals and Risk Factor Review     Row Name 04/13/23 1650 05/10/23 1504           Core Components/Risk Factors/Patient Goals Review   Personal Goals Review Weight Management/Obesity;Stress;Hypertension;Lipids;Diabetes Weight Management/Obesity;Stress;Hypertension;Lipids;Diabetes      Review Quantisha started cardiac rehab on 04/13/23 and did fair with exercise for her fitness level. Vital signs. Patient reported some genralized weakness deined shortness of breath. Encouraged to increase hydration. Charolet is deconditioned. Adda continues to do fair with exercise for her fitness level at  Vital signs have been stable.      Expected Outcomes Lielle will continue to participate in cardiac rehab for exercise, nutrition and lifestyle modifications Eriel will continue to participate in cardiac rehab for exercise, nutrition and lifestyle modifications               Core Components/Risk Factors/Patient Goals at Discharge (Final Review):   Goals and Risk Factor Review - 05/10/23 1504       Core Components/Risk Factors/Patient Goals Review   Personal Goals Review Weight Management/Obesity;Stress;Hypertension;Lipids;Diabetes    Review Marajade continues to do fair with exercise for her fitness level at  Vital signs have been stable.    Expected Outcomes Porcia will continue to participate in cardiac rehab for exercise, nutrition and lifestyle modifications             ITP  Comments:  ITP Comments     Row Name 04/07/23 1327 04/13/23 1646 05/10/23 1409       ITP Comments Rondell Reams, MD: Medical Director. Introduction to the [facility] / Intensive Cardiac Rehab.  Initial orientation packet reviewed with the patient today. 30 Day ITP comment. Omani started cardiac rehab on 04/13/23 and did well wiht exercise for her fitness level 30 Day ITP comment. Vashon has good participation in cardiac rehab when in attendance              Comments: See ITP comments.Thayer Headings RN BSN

## 2023-05-11 ENCOUNTER — Encounter (HOSPITAL_COMMUNITY): Payer: PPO

## 2023-05-11 ENCOUNTER — Encounter (HOSPITAL_COMMUNITY)
Admission: RE | Admit: 2023-05-11 | Discharge: 2023-05-11 | Disposition: A | Discharge status: Home / Self Care | Payer: PPO | Source: Ambulatory Visit | Attending: Cardiovascular Disease | Admitting: Cardiovascular Disease

## 2023-05-11 DIAGNOSIS — Z955 Presence of coronary angioplasty implant and graft: Secondary | ICD-10-CM

## 2023-05-11 DIAGNOSIS — Z48812 Encounter for surgical aftercare following surgery on the circulatory system: Secondary | ICD-10-CM | POA: Diagnosis not present

## 2023-05-11 DIAGNOSIS — I214 Non-ST elevation (NSTEMI) myocardial infarction: Secondary | ICD-10-CM

## 2023-05-12 ENCOUNTER — Other Ambulatory Visit (HOSPITAL_COMMUNITY): Payer: Self-pay

## 2023-05-13 ENCOUNTER — Encounter (HOSPITAL_COMMUNITY): Payer: PPO

## 2023-05-18 ENCOUNTER — Encounter (HOSPITAL_COMMUNITY)
Admission: RE | Admit: 2023-05-18 | Discharge: 2023-05-18 | Disposition: A | Discharge status: Home / Self Care | Payer: PPO | Source: Ambulatory Visit | Attending: Cardiovascular Disease | Admitting: Cardiovascular Disease

## 2023-05-18 ENCOUNTER — Encounter (HOSPITAL_COMMUNITY): Payer: PPO

## 2023-05-18 DIAGNOSIS — Z955 Presence of coronary angioplasty implant and graft: Secondary | ICD-10-CM | POA: Diagnosis present

## 2023-05-18 DIAGNOSIS — Z48812 Encounter for surgical aftercare following surgery on the circulatory system: Secondary | ICD-10-CM | POA: Insufficient documentation

## 2023-05-18 DIAGNOSIS — I214 Non-ST elevation (NSTEMI) myocardial infarction: Secondary | ICD-10-CM

## 2023-05-18 DIAGNOSIS — I252 Old myocardial infarction: Secondary | ICD-10-CM | POA: Insufficient documentation

## 2023-05-20 ENCOUNTER — Encounter (HOSPITAL_COMMUNITY): Payer: PPO

## 2023-05-20 ENCOUNTER — Encounter (HOSPITAL_COMMUNITY)
Admission: RE | Admit: 2023-05-20 | Discharge: 2023-05-20 | Disposition: A | Discharge status: Home / Self Care | Payer: PPO | Source: Ambulatory Visit | Attending: Cardiovascular Disease | Admitting: Cardiovascular Disease

## 2023-05-20 ENCOUNTER — Other Ambulatory Visit (HOSPITAL_COMMUNITY): Payer: Self-pay

## 2023-05-20 DIAGNOSIS — Z955 Presence of coronary angioplasty implant and graft: Secondary | ICD-10-CM | POA: Diagnosis not present

## 2023-05-20 DIAGNOSIS — I214 Non-ST elevation (NSTEMI) myocardial infarction: Secondary | ICD-10-CM

## 2023-05-20 LAB — GLUCOSE, CAPILLARY
Glucose-Capillary: 105 mg/dL — ABNORMAL HIGH (ref 70–99)
Glucose-Capillary: 70 mg/dL (ref 70–99)

## 2023-05-20 NOTE — Progress Notes (Addendum)
CBG 69 by her CGM. Asymptomatic. CBG 70 via hospital meter. Patient was given ginger ale and graham crackers. Repeat CBG 84 by her CGM and 105 by hospital meter. Patient's primary care provider Romona Curls PA office called and notified about today's CBG readings. Murel talked with nurse over the phone and was told to decrease her metformin back to 500 mg twice a day. Necole left cardiac rehab without complaints or symptoms. Will continue to monitor the patient throughout  the program. Thayer Headings RN BSN

## 2023-05-23 ENCOUNTER — Encounter (HOSPITAL_COMMUNITY): Payer: PPO

## 2023-05-25 ENCOUNTER — Encounter (HOSPITAL_COMMUNITY)
Admission: RE | Admit: 2023-05-25 | Discharge: 2023-05-25 | Disposition: A | Discharge status: Home / Self Care | Payer: PPO | Source: Ambulatory Visit | Attending: Cardiovascular Disease | Admitting: Cardiovascular Disease

## 2023-05-25 ENCOUNTER — Encounter (HOSPITAL_COMMUNITY): Payer: PPO

## 2023-05-25 VITALS — BP 137/57 | HR 82

## 2023-05-25 DIAGNOSIS — I214 Non-ST elevation (NSTEMI) myocardial infarction: Secondary | ICD-10-CM

## 2023-05-25 DIAGNOSIS — Z955 Presence of coronary angioplasty implant and graft: Secondary | ICD-10-CM

## 2023-05-25 DIAGNOSIS — Z006 Encounter for examination for normal comparison and control in clinical research program: Secondary | ICD-10-CM

## 2023-05-25 NOTE — Research (Addendum)
[facility] Unscheduled Visit    Unscheduled visit for lab re draw.  Are there any labs that are clinically significant?  Yes []  OR No[x]   Is the patient eligible to continue enrollment in the study after screening visit?  Yes [x]   OR No[]    Maisie Fus D. Riley Kill ,MD Medical Director, [place]

## 2023-05-27 ENCOUNTER — Encounter (HOSPITAL_COMMUNITY)
Admission: RE | Admit: 2023-05-27 | Discharge: 2023-05-27 | Disposition: A | Discharge status: Home / Self Care | Payer: PPO | Source: Ambulatory Visit | Attending: Cardiovascular Disease | Admitting: Cardiovascular Disease

## 2023-05-27 ENCOUNTER — Encounter (HOSPITAL_COMMUNITY): Payer: PPO

## 2023-05-27 DIAGNOSIS — Z955 Presence of coronary angioplasty implant and graft: Secondary | ICD-10-CM

## 2023-05-27 DIAGNOSIS — I214 Non-ST elevation (NSTEMI) myocardial infarction: Secondary | ICD-10-CM

## 2023-05-30 ENCOUNTER — Encounter (HOSPITAL_COMMUNITY): Payer: PPO

## 2023-06-01 ENCOUNTER — Encounter (HOSPITAL_COMMUNITY): Payer: PPO

## 2023-06-01 ENCOUNTER — Other Ambulatory Visit (HOSPITAL_COMMUNITY): Payer: Self-pay

## 2023-06-01 ENCOUNTER — Encounter (HOSPITAL_COMMUNITY)
Admission: RE | Admit: 2023-06-01 | Discharge: 2023-06-01 | Disposition: A | Discharge status: Home / Self Care | Payer: PPO | Source: Ambulatory Visit | Attending: Cardiovascular Disease | Admitting: Cardiovascular Disease

## 2023-06-01 DIAGNOSIS — Z955 Presence of coronary angioplasty implant and graft: Secondary | ICD-10-CM

## 2023-06-01 DIAGNOSIS — I214 Non-ST elevation (NSTEMI) myocardial infarction: Secondary | ICD-10-CM

## 2023-06-01 MED ORDER — LEVOTHYROXINE SODIUM 50 MCG PO TABS
50.0000 ug | ORAL_TABLET | Freq: Every morning | ORAL | 0 refills | Status: DC
  Filled 2023-06-01: qty 90, 90d supply, fill #0

## 2023-06-03 ENCOUNTER — Other Ambulatory Visit (HOSPITAL_COMMUNITY): Payer: Self-pay

## 2023-06-03 ENCOUNTER — Encounter (HOSPITAL_COMMUNITY)
Admission: RE | Admit: 2023-06-03 | Discharge: 2023-06-03 | Disposition: A | Discharge status: Home / Self Care | Payer: PPO | Source: Ambulatory Visit | Attending: Cardiovascular Disease | Admitting: Cardiovascular Disease

## 2023-06-03 ENCOUNTER — Encounter (HOSPITAL_COMMUNITY): Payer: PPO

## 2023-06-03 DIAGNOSIS — I214 Non-ST elevation (NSTEMI) myocardial infarction: Secondary | ICD-10-CM

## 2023-06-03 DIAGNOSIS — Z955 Presence of coronary angioplasty implant and graft: Secondary | ICD-10-CM

## 2023-06-06 ENCOUNTER — Encounter (HOSPITAL_COMMUNITY): Payer: PPO

## 2023-06-08 ENCOUNTER — Encounter (HOSPITAL_COMMUNITY): Payer: PPO

## 2023-06-08 ENCOUNTER — Encounter (HOSPITAL_COMMUNITY)
Admission: RE | Admit: 2023-06-08 | Discharge: 2023-06-08 | Disposition: A | Discharge status: Home / Self Care | Payer: PPO | Source: Ambulatory Visit | Attending: Cardiovascular Disease | Admitting: Cardiovascular Disease

## 2023-06-08 DIAGNOSIS — I214 Non-ST elevation (NSTEMI) myocardial infarction: Secondary | ICD-10-CM

## 2023-06-08 DIAGNOSIS — Z955 Presence of coronary angioplasty implant and graft: Secondary | ICD-10-CM

## 2023-06-08 NOTE — Progress Notes (Signed)
Cardiac Individual Treatment Plan  Patient Details  Name: Suzanne Sutton MRN: [id number] Date of Birth: Oct 25, 1945 Referring Provider:   Flowsheet Row INTENSIVE CARDIAC REHAB ORIENT from 04/07/2023 in [facility]  Referring Provider Nanetta Batty, MD       Initial Encounter Date:  Flowsheet Row INTENSIVE CARDIAC REHAB ORIENT from 04/07/2023 in [facility]  Date 04/07/23       Visit Diagnosis: NSTEMI (non-ST elevated myocardial infarction) (HCC)  S/P drug eluting coronary stent placement  Patient's Home Medications on Admission:  Current Outpatient Medications:    albuterol (VENTOLIN HFA) 108 (90 Base) MCG/ACT inhaler, Inhale 1 puff into the lungs every 4 (four) hours as needed., Disp: 6.7 g, Rfl: 1   aspirin EC 81 MG tablet, Take 1 tablet (81 mg total) by mouth daily. Swallow whole., Disp: 120 tablet, Rfl: 3   atorvastatin (LIPITOR) 40 MG tablet, Take 1 tablet (40 mg total) by mouth daily., Disp: 30 tablet, Rfl: 3   azelastine (ASTELIN) 137 MCG/SPRAY nasal spray, Place 1 spray into the nose daily as needed for allergies (post nasal drip). Use in each nostril as directed, Disp: , Rfl:    benazepril-hydrochlorthiazide (LOTENSIN HCT) 20-25 MG per tablet, Take 1 tablet by mouth daily., Disp: , Rfl:    Beta Carotene (VITAMIN A) 25000 UNIT capsule, Take 25,000 Units by mouth daily., Disp: , Rfl:    clonazePAM (KLONOPIN) 0.5 MG tablet, Take 0.25 mg by mouth at bedtime as needed for anxiety (sleep)., Disp: , Rfl:    clonazePAM (KLONOPIN) 0.5 MG tablet, Take 0.5-1 tablets (0.25-0.5 mg total) by mouth as needed., Disp: 30 tablet, Rfl: 0   Continuous Glucose Receiver (FREESTYLE LIBRE 2 READER) DEVI, Use to monitor blood glucose continouously, Disp: 2 each, Rfl: 5   Continuous Glucose Sensor (FREESTYLE LIBRE 2 SENSOR) MISC, Use as directed, Disp: 2 each, Rfl: 5   glucose blood  (FREESTYLE TEST STRIPS) test strip, USE AS INSTRUCTED TO CHECK BLOOD SUGAR ONCE DAILY DX CODE E11.9, Disp: 50 each, Rfl: 0   levothyroxine (SYNTHROID) 50 MCG tablet, Take 1 tablet (50 mcg total) by mouth every morning on an empty stomach, Disp: 90 tablet, Rfl: 0   levothyroxine (SYNTHROID, LEVOTHROID) 50 MCG tablet, TAKE 1 TABLET (50 MCG TOTAL) BY MOUTH DAILY BEFORE BREAKFAST., Disp: 30 tablet, Rfl: 5   metFORMIN (GLUCOPHAGE) 500 MG tablet, Take 500 mg by mouth 2 (two) times daily with a meal. Dose decreased to 500 mg twice a day per PCP., Disp: , Rfl:    metoprolol succinate (TOPROL-XL) 25 MG 24 hr tablet, Take 1 tablet (25 mg total) by mouth daily., Disp: 30 tablet, Rfl: 3   nitroGLYCERIN (NITROSTAT) 0.4 MG SL tablet, Place 1 tablet (0.4 mg total) under the tongue every 5 (five) minutes x 3 doses as needed for chest pain., Disp: 25 tablet, Rfl: 0   Potassium Chloride ER 20 MEQ TBCR, Take 1 tablet (20 mEq total) by mouth daily., Disp: 90 tablet, Rfl: 1   potassium chloride SA (K-DUR,KLOR-CON) 20 MEQ tablet, Take 1 tablet (20 mEq total) by mouth daily. (Patient taking differently: Take 20 mEq by mouth daily with lunch.), Disp: 30 tablet, Rfl: 3   PROAIR HFA 108 (90 BASE) MCG/ACT inhaler, INHALE 1 TO 2 PUFFS BY MOUTH EVERY 6 HOURS AS NEEDED, Disp: , Rfl: 1   Study - LIBREXIA-ACS - milvexian 25 mg or placebo tablet (PI-Stuckey),  Take 1 tablet by mouth 2 (two) times daily. Take with or without food. Bring bottle back to every visit. For Investigational Use Only. Please contact [facility] for any questions or concerns regarding this medication., Disp: 210 tablet, Rfl: 0   ticagrelor (BRILINTA) 90 MG TABS tablet, Take 1 tablet (90 mg total) by mouth 2 (two) times daily., Disp: 60 tablet, Rfl: 3  Past Medical History: Past Medical History:  Diagnosis Date   Anxiety 11/16/2013   Hyperlipidemia 05/09/2013   Hypertension 01/27/2011   Hypokalemia 11/16/2013   Hypothyroidism 05/11/2013    Seasonal allergies 01/27/2011   Uterine cancer (HCC)     Tobacco Use: Social History   Tobacco Use  Smoking Status Never  Smokeless Tobacco Never    Labs: Review Flowsheet  More data exists      Latest Ref Rng & Units 05/15/2014 11/08/2014 05/28/2015 03/21/2023 03/22/2023  Labs for ITP Cardiac and Pulmonary Rehab  Cholestrol 0 - 200 mg/dL 161  - - - 096   LDL (calc) 0 - 99 mg/dL 84  - - - 93   HDL-C >04 mg/dL 54.09  - - - 59   Trlycerides <150 mg/dL 811.9  - - - 87   Hemoglobin A1c 4.8 - 5.6 % 6.6  6.6  6.5  7.0  -    Details            Capillary Blood Glucose: Lab Results  Component Value Date   GLUCAP 105 (H) 05/20/2023   GLUCAP 70 05/20/2023   GLUCAP 156 (H) 04/15/2023   GLUCAP 174 (H) 04/15/2023   GLUCAP 169 (H) 04/13/2023     Exercise Target Goals: Exercise Program Goal: Individual exercise prescription set using results from initial 6 min walk test and THRR while considering  patient's activity barriers and safety.   Exercise Prescription Goal: Initial exercise prescription builds to 30-45 minutes a day of aerobic activity, 2-3 days per week.  Home exercise guidelines will be given to patient during program as part of exercise prescription that the participant will acknowledge.  Activity Barriers & Risk Stratification:  Activity Barriers & Cardiac Risk Stratification - 04/07/23 1519       Activity Barriers & Cardiac Risk Stratification   Activity Barriers Deconditioning;Shortness of Breath;Neck/Spine Problems;Assistive Device;Other (comment)    Comments dizziness    Cardiac Risk Stratification High   <5 METs on            6 Minute Walk:  6 Minute Walk     Row Name 04/07/23 1517         6 Minute Walk   Phase Initial     Distance 480 feet     Walk Time 6 minutes     # of Rest Breaks 1  2:30-4:00 3/10 lightheadedness, resolved and pt continued. see note in ITP for details     MPH 0.9     METS 1.21     RPE 13     Perceived Dyspnea  1      VO2 Peak 4.23     Symptoms Yes (comment)     Comments 3/10 lightheadedness. VSS, resolved with seated rest break. RPD = 1, pt has allergy cough and resolved with rest     Resting HR 96 bpm     Resting BP 108/68     Resting Oxygen Saturation  100 %     Exercise Oxygen Saturation  during 6 min walk 98 %     Max Ex. HR 112  bpm     Max Ex. BP 126/66     2 Minute Post BP 114/68              Oxygen Initial Assessment:   Oxygen Re-Evaluation:   Oxygen Discharge (Final Oxygen Re-Evaluation):   Initial Exercise Prescription:  Initial Exercise Prescription - 04/07/23 1500       Date of Initial Exercise RX and Referring Provider   Date 04/07/23    Referring Provider Nanetta Batty, MD    Expected Discharge Date 06/29/23      T5 Nustep   Level 1    SPM 50    Minutes 30    METs 1.5      Prescription Details   Frequency (times per week) 3    Duration Progress to 30 minutes of continuous aerobic without signs/symptoms of physical distress      Intensity   THRR 40-80% of Max Heartrate 57-114    Ratings of Perceived Exertion 11-13    Perceived Dyspnea 0-4      Progression   Progression Continue progressive overload as per policy without signs/symptoms or physical distress.      Resistance Training   Training Prescription Yes    Weight 2    Reps 10-15             Perform Capillary Blood Glucose checks as needed.  Exercise Prescription Changes:   Exercise Prescription Changes     Row Name 04/13/23 1644 04/29/23 1712 05/18/23 1645 06/08/23 1610       Response to Exercise   Blood Pressure (Admit) 104/58 104/66 124/62 128/70    Blood Pressure (Exercise) 120/60 134/72 128/64 128/72    Blood Pressure (Exit) 102/60 106/60 102/68 98/64    Heart Rate (Admit) 88 bpm 77 bpm 91 bpm 101 bpm    Heart Rate (Exercise) 91 bpm 94 bpm 97 bpm 99 bpm    Heart Rate (Exit) 81 bpm 79 bpm 87 bpm 86 bpm    Rating of Perceived Exertion (Exercise) 9 13 11 11     Perceived Dyspnea  (Exercise) 0 0 0 0    Symptoms none none none none    Comments Pt first day in the [employer] program Reviewed MET's, goals and home ExRx Reviewed MET's Reviewed MET's and goals    Duration Progress to 30 minutes of  aerobic without signs/symptoms of physical distress Progress to 30 minutes of  aerobic without signs/symptoms of physical distress Progress to 30 minutes of  aerobic without signs/symptoms of physical distress Progress to 30 minutes of  aerobic without signs/symptoms of physical distress    Intensity THRR unchanged THRR unchanged THRR unchanged THRR unchanged      Progression   Progression Continue to progress workloads to maintain intensity without signs/symptoms of physical distress. Continue to progress workloads to maintain intensity without signs/symptoms of physical distress. Continue to progress workloads to maintain intensity without signs/symptoms of physical distress. Continue to progress workloads to maintain intensity without signs/symptoms of physical distress.    Average METs 1.3 1.5 1.6 1.8      Resistance Training   Training Prescription No Yes No No    Weight -- 2 lbs wts 2 lbs wts 2 lbs wts    Reps -- 10-15 10-15 10-15    Time -- 10 Minutes 10 Minutes 10 Minutes      T5 Nustep   Level 1 1 1 2     SPM 25 50 78 99    Minutes 30 30 30  30    METs 1.3 1.5 1.6 1.8      Home Exercise Plan   Plans to continue exercise at -- Home (comment) Home (comment) Home (comment)    Frequency -- Add 4 additional days to program exercise sessions. Add 4 additional days to program exercise sessions. Add 4 additional days to program exercise sessions.    Initial Home Exercises Provided -- 04/29/23 04/29/23 04/29/23             Exercise Comments:   Exercise Comments     Row Name 04/13/23 1649 04/29/23 1717 06/08/23 1617       Exercise Comments Pt first day in the [facility] program. Pt tolerated low intensity exercise well with an average MET level of 1.3. She is  learning her THRR, RPE and ExRx. She is off to a good start. Will continue to increase workloads as tolerated without sign or symtpom Reviewed MET's, goals and home ExRx. Pt tolerated exercise well with an average MET level of 1.5. Pt feels good about her goals and is gaining strength and stamina, she is also feeling more confident at work and is able to get back to some of her ADL's like canning and cooking. Pt is walking and plans to add in a home stepper as able for 15-30 mins for 3-4 days a week. Reviewed MET's and goals. Pt tolerated exercise well with an average MET level of 1.8. Pt is feeling better and is increasing strength and stamina. She is back at work with more hours and is able to take on more home activities like canning and things around the house, which she enjoys. she is feeling good with her progress.              Exercise Goals and Review:   Exercise Goals     Row Name 04/07/23 1342             Exercise Goals   Increase Physical Activity Yes       Intervention Provide advice, education, support and counseling about physical activity/exercise needs.;Develop an individualized exercise prescription for aerobic and resistive training based on initial evaluation findings, risk stratification, comorbidities and participant's personal goals.       Expected Outcomes Short Term: Attend rehab on a regular basis to increase amount of physical activity.;Long Term: Exercising regularly at least 3-5 days a week.;Long Term: Add in home exercise to make exercise part of routine and to increase amount of physical activity.       Increase Strength and Stamina Yes       Intervention Provide advice, education, support and counseling about physical activity/exercise needs.;Develop an individualized exercise prescription for aerobic and resistive training based on initial evaluation findings, risk stratification, comorbidities and participant's personal goals.       Expected Outcomes Short  Term: Increase workloads from initial exercise prescription for resistance, speed, and METs.;Short Term: Perform resistance training exercises routinely during rehab and add in resistance training at home;Long Term: Improve cardiorespiratory fitness, muscular endurance and strength as measured by increased METs and functional capacity ( )       Able to understand and use rate of perceived exertion (RPE) scale Yes       Intervention Provide education and explanation on how to use RPE scale       Expected Outcomes Short Term: Able to use RPE daily in rehab to express subjective intensity level;Long Term:  Able to use RPE to guide intensity level when exercising independently  Knowledge and understanding of Target Heart Rate Range (THRR) Yes       Intervention Provide education and explanation of THRR including how the numbers were predicted and where they are located for reference       Expected Outcomes Short Term: Able to state/look up THRR;Short Term: Able to use daily as guideline for intensity in rehab;Long Term: Able to use THRR to govern intensity when exercising independently       Understanding of Exercise Prescription Yes       Intervention Provide education, explanation, and written materials on patient's individual exercise prescription       Expected Outcomes Short Term: Able to explain program exercise prescription;Long Term: Able to explain home exercise prescription to exercise independently                Exercise Goals Re-Evaluation :  Exercise Goals Re-Evaluation     Row Name 04/13/23 1646 04/29/23 1714 05/18/23 1643 06/08/23 1612       Exercise Goal Re-Evaluation   Exercise Goals Review Increase Physical Activity;Understanding of Exercise Prescription;Increase Strength and Stamina;Knowledge and understanding of Target Heart Rate Range (THRR);Able to understand and use rate of perceived exertion (RPE) scale Increase Physical Activity;Understanding of Exercise  Prescription;Increase Strength and Stamina;Knowledge and understanding of Target Heart Rate Range (THRR);Able to understand and use rate of perceived exertion (RPE) scale Increase Physical Activity;Understanding of Exercise Prescription;Increase Strength and Stamina;Knowledge and understanding of Target Heart Rate Range (THRR);Able to understand and use rate of perceived exertion (RPE) scale Increase Physical Activity;Understanding of Exercise Prescription;Increase Strength and Stamina;Knowledge and understanding of Target Heart Rate Range (THRR);Able to understand and use rate of perceived exertion (RPE) scale    Comments Pt first day in the [facility] program. Pt tolerated low intensity exercise well with an average MET level of 1.3. She is learning her THRR, RPE and ExRx. She is off to a good start. Reviewed MET's, goals and home ExRx. Pt tolerated exercise well with an average MET level of 1.5. Pt feels good about her goals and is gaining strength and stamina, she is also feeling more confident at work and is able to get back to some of her ADL's like canning and cooking. Pt is walking and plans to add in a home stepper as able for 15-30 mins for 3-4 days a week. Reviewed MET's with pt. Pt tolerated exercise well with an average MET level of 1.6. Pt feels good and is able to do longer shifts at work, which she is enjoying Reviewed MET's and goals. Pt tolerated exercise well with an average MET level of 1.8. Pt is feeling better and is increasing strength and stamina. She is back at work with more hours and is able to take on more home activities like canning and things around the house, which she enjoys.  she is feeling good with her progress.    Expected Outcomes Will continue to increase workloads as tolerated without sign or symtpom Will continue to increase workloads as tolerated without sign or symtpom Will continue to increase workloads as tolerated without sign or symtpom Will continue to increase  workloads as tolerated without sign or symtpom             Discharge Exercise Prescription (Final Exercise Prescription Changes):  Exercise Prescription Changes - 06/08/23 1610       Response to Exercise   Blood Pressure (Admit) 128/70    Blood Pressure (Exercise) 128/72    Blood Pressure (Exit) 98/64  Heart Rate (Admit) 101 bpm    Heart Rate (Exercise) 99 bpm    Heart Rate (Exit) 86 bpm    Rating of Perceived Exertion (Exercise) 11    Perceived Dyspnea (Exercise) 0    Symptoms none    Comments Reviewed MET's and goals    Duration Progress to 30 minutes of  aerobic without signs/symptoms of physical distress    Intensity THRR unchanged      Progression   Progression Continue to progress workloads to maintain intensity without signs/symptoms of physical distress.    Average METs 1.8      Resistance Training   Training Prescription No    Weight 2 lbs wts    Reps 10-15    Time 10 Minutes      T5 Nustep   Level 2    SPM 99    Minutes 30    METs 1.8      Home Exercise Plan   Plans to continue exercise at Home (comment)    Frequency Add 4 additional days to program exercise sessions.    Initial Home Exercises Provided 04/29/23             Nutrition:  Target Goals: Understanding of nutrition guidelines, daily intake of sodium 1500mg , cholesterol 200mg , calories 30% from fat and 7% or less from saturated fats, daily to have 5 or more servings of fruits and vegetables.  Biometrics:  Pre Biometrics - 04/07/23 1328       Pre Biometrics   Waist Circumference 42 inches    Hip Circumference 41 inches    Waist to Hip Ratio 1.02 %    Triceps Skinfold 27 mm    % Body Fat 41.1 %    Grip Strength 18 kg    Flexibility 0 in   not done due to back issues   Single Leg Stand 8.87 seconds              Nutrition Therapy Plan and Nutrition Goals:  Nutrition Therapy & Goals - 05/19/23 0923       Nutrition Therapy   Diet Heart Healthy Diet    Drug/Food  Interactions Statins/Certain Fruits      Personal Nutrition Goals   Nutrition Goal Patient to identify strategies for reducing cardiovascular risk by attending the [facility] education and nutrition series weekly.   goal in action.   Personal Goal #2 Patient to improve diet quality by using the plate method as a guide for meal planning to include lean protein/plant protein, fruits, vegetables, whole grains, nonfat dairy as part of a well-balanced diet.   goal in action.   Personal Goal #3 Patient to identify strategies for blood sugar control with goal A1c <7%   goal in action.   Comments Goals in action. Makaiyla continues to attend the [facility] education and nutrition series regularly. Engracia reports that she has started making many dietary changes including reduced carbohydrate intake and switching to whole grains and higher fiber food choices. Her son is an excellent support and does much of the grcoery shopping and cooking. She is monitoring her blood sugar regularly and reports fasting/am blood sugars <120. She is down 3.3# since starting with our program and down >15# since January 2024. Matthew is eager to improve energy level and ultimately return back to work. Patient will benefit from participation in intensive cardiac rehab for nutrition, exercise, and lifestyle modification.      Intervention Plan   Intervention Prescribe, educate and counsel regarding individualized specific dietary  modifications aiming towards targeted core components such as weight, hypertension, lipid management, diabetes, heart failure and other comorbidities.;Nutrition handout(s) given to patient.    Expected Outcomes Short Term Goal: Understand basic principles of dietary content, such as calories, fat, sodium, cholesterol and nutrients.;Long Term Goal: Adherence to prescribed nutrition plan.             Nutrition Assessments:  Nutrition Assessments - 04/14/23 0932       Rate Your Plate Scores   Pre Score 49             MEDIFICTS Score Key: >=70 Need to make dietary changes  40-70 Heart Healthy Diet <= 40 Therapeutic Level Cholesterol Diet   Flowsheet Row INTENSIVE CARDIAC REHAB from 04/13/2023 in [facility]  Picture Your Plate Total Score on Admission 49      Picture Your Plate Scores: <16 Unhealthy dietary pattern with much room for improvement. 41-50 Dietary pattern unlikely to meet recommendations for good health and room for improvement. 51-60 More healthful dietary pattern, with some room for improvement.  >60 Healthy dietary pattern, although there may be some specific behaviors that could be improved.    Nutrition Goals Re-Evaluation:  Nutrition Goals Re-Evaluation     Row Name 04/14/23 0924 05/19/23 0923           Goals   Current Weight 163 lb 9.3 oz (74.2 kg) 160 lb 4.4 oz (72.7 kg)      Comment lipids WNL, LipoproteinA WNL, A1c 7.0 no new labs; most recent labs  LDL 93 (treated with lipitor), LipoproteinA WNL, A1c 7.0      Expected Outcome Nakayla reports that she has started making many dietary changes including reduced carbohydrate intake and switching to whole grains and higher fiber food choices. Her son is an excellent support and does much of the grcoery shopping and cooking. She is monitoring her blood sugar regularly and reports fasting/am blood sugars <120. She is down ~12# per documentation (175# on 09/17/2022) since making lifestyle changes. Troylene is eager to improve energy level and ultimately return back to work. Patient will benefit from participation in intensive cardiac rehab for nutrition, exercise, and lifestyle modification. Goals in action. Earica continues to attend the [facility] education and nutrition series regularly. Deshone reports that she has started making many dietary changes including reduced carbohydrate intake and switching to whole grains and higher fiber food choices. Her son is an excellent support and  does much of the grcoery shopping and cooking. She is monitoring her blood sugar regularly and reports fasting/am blood sugars <120. She is down 3.3# since starting with our program and down >15# since January 2024. Jung is eager to improve energy level and ultimately return back to work. Patient will benefit from participation in intensive cardiac rehab for nutrition, exercise, and lifestyle modification.               Nutrition Goals Re-Evaluation:  Nutrition Goals Re-Evaluation     Row Name 04/14/23 0924 05/19/23 0923           Goals   Current Weight 163 lb 9.3 oz (74.2 kg) 160 lb 4.4 oz (72.7 kg)      Comment lipids WNL, LipoproteinA WNL, A1c 7.0 no new labs; most recent labs  LDL 93 (treated with lipitor), LipoproteinA WNL, A1c 7.0      Expected Outcome Cythia reports that she has started making many dietary changes including reduced carbohydrate intake and switching  to whole grains and higher fiber food choices. Her son is an excellent support and does much of the grcoery shopping and cooking. She is monitoring her blood sugar regularly and reports fasting/am blood sugars <120. She is down ~12# per documentation (175# on 09/17/2022) since making lifestyle changes. Bhavana is eager to improve energy level and ultimately return back to work. Patient will benefit from participation in intensive cardiac rehab for nutrition, exercise, and lifestyle modification. Goals in action. Jurlean continues to attend the [facility] education and nutrition series regularly. Lenis reports that she has started making many dietary changes including reduced carbohydrate intake and switching to whole grains and higher fiber food choices. Her son is an excellent support and does much of the grcoery shopping and cooking. She is monitoring her blood sugar regularly and reports fasting/am blood sugars <120. She is down 3.3# since starting with our program and down >15# since January 2024. Kinslee is eager to improve energy level and  ultimately return back to work. Patient will benefit from participation in intensive cardiac rehab for nutrition, exercise, and lifestyle modification.               Nutrition Goals Discharge (Final Nutrition Goals Re-Evaluation):  Nutrition Goals Re-Evaluation - 05/19/23 1610       Goals   Current Weight 160 lb 4.4 oz (72.7 kg)    Comment no new labs; most recent labs  LDL 93 (treated with lipitor), LipoproteinA WNL, A1c 7.0    Expected Outcome Goals in action. Sora continues to attend the [facility] education and nutrition series regularly. Ramyiah reports that she has started making many dietary changes including reduced carbohydrate intake and switching to whole grains and higher fiber food choices. Her son is an excellent support and does much of the grcoery shopping and cooking. She is monitoring her blood sugar regularly and reports fasting/am blood sugars <120. She is down 3.3# since starting with our program and down >15# since January 2024. Katurah is eager to improve energy level and ultimately return back to work. Patient will benefit from participation in intensive cardiac rehab for nutrition, exercise, and lifestyle modification.             Psychosocial: Target Goals: Acknowledge presence or absence of significant depression and/or stress, maximize coping skills, provide positive support system. Participant is able to verbalize types and ability to use techniques and skills needed for reducing stress and depression.  Initial Review & Psychosocial Screening:  Initial Psych Review & Screening - 04/07/23 1524       Initial Review   Current issues with Current Stress Concerns    Source of Stress Concerns Unable to perform yard/household activities;Unable to participate in former interests or hobbies;Occupation    Comments Kaiana denies any feelings of anxiety or depression, however Virgie shared that she has not felt comfortable resuming her hobbies, such as Geophysical data processor and housework,  since her heart attack. She is eager to return to these activities, but shared that she is stressed because cannot do them or go to work. She is hoping to return to work next week. Yariela also shared that her job can be stressful at times as well. Support offered, additional resources denied.      Family Dynamics   Good Support System? Yes   Evalyn Casco has her son and daughter for support     Barriers   Psychosocial barriers to participate in program The patient should benefit from training in stress management and relaxation.  Screening Interventions   Interventions Provide feedback about the scores to participant;Encouraged to exercise;To provide support and resources with identified psychosocial needs    Expected Outcomes Long Term Goal: Stressors or current issues are controlled or eliminated.;Short Term goal: Identification and review with participant of any Quality of Life or Depression concerns found by scoring the questionnaire.;Long Term goal: The participant improves quality of Life and PHQ9 Scores as seen by post scores and/or verbalization of changes             Quality of Life Scores:  Quality of Life - 04/07/23 1527       Quality of Life   Select Quality of Life      Quality of Life Scores   Health/Function Pre 21.75 %    Socioeconomic Pre 21.86 %    Psych/Spiritual Pre 21.71 %    Family Pre 27 %    GLOBAL Pre 22.42 %            Scores of 19 and below usually indicate a poorer quality of life in these areas.  A difference of  2-3 points is a clinically meaningful difference.  A difference of 2-3 points in the total score of the Quality of Life Index has been associated with significant improvement in overall quality of life, self-image, physical symptoms, and general health in studies assessing change in quality of life.  PHQ-9: Review Flowsheet       04/07/2023  Depression screen PHQ 2/9  Decreased Interest 0  Down, Depressed, Hopeless 0  PHQ - 2 Score 0   Altered sleeping 2  Tired, decreased energy 2  Change in appetite 1  Feeling bad or failure about yourself  1  Trouble concentrating 0  Moving slowly or fidgety/restless 0  Suicidal thoughts 0  PHQ-9 Score 6  Difficult doing work/chores --    Details           Interpretation of Total Score  Total Score Depression Severity:  1-4 = Minimal depression, 5-9 = Mild depression, 10-14 = Moderate depression, 15-19 = Moderately severe depression, 20-27 = Severe depression   Psychosocial Evaluation and Intervention:   Psychosocial Re-Evaluation:  Psychosocial Re-Evaluation     Row Name 04/13/23 1648 05/10/23 1410 06/08/23 1513         Psychosocial Re-Evaluation   Current issues with Current Stress Concerns Current Stress Concerns Current Stress Concerns     Comments Akeema says she still feel weak due her recent cardiac procedure. Emotional support provided Marchae has not voiced any increased concerns or stressors at cardiac rehab. Natina continues to work part time Keanna has no concerns or stressors at cardiac rehab.     Expected Outcomes Raileigh will have decreased or controlled stress upon completion of cardiac rehab Kamauria will have decreased or controlled stress upon completion of cardiac rehab Sagen will have decreased or controlled stress upon completion of cardiac rehab     Interventions Encouraged to attend Cardiac Rehabilitation for the exercise;Stress management education;Relaxation education Encouraged to attend Cardiac Rehabilitation for the exercise;Stress management education;Relaxation education Encouraged to attend Cardiac Rehabilitation for the exercise;Stress management education;Relaxation education     Continue Psychosocial Services  Follow up required by staff Follow up required by staff No Follow up required       Initial Review   Source of Stress Concerns Unable to participate in former interests or hobbies;Unable to perform yard/household activities;Chronic  Illness;Occupation Unable to participate in former interests or hobbies;Unable to perform yard/household activities;Chronic Illness;Occupation Unable  to participate in former interests or hobbies;Unable to perform yard/household activities;Chronic Illness;Occupation     Comments will continue to monitor and offer support as needed. will continue to monitor and offer support as needed. will continue to monitor and offer support as needed.              Psychosocial Discharge (Final Psychosocial Re-Evaluation):  Psychosocial Re-Evaluation - 06/08/23 1513       Psychosocial Re-Evaluation   Current issues with Current Stress Concerns    Comments Lyndsee has no concerns or stressors at cardiac rehab.    Expected Outcomes Royalti will have decreased or controlled stress upon completion of cardiac rehab    Interventions Encouraged to attend Cardiac Rehabilitation for the exercise;Stress management education;Relaxation education    Continue Psychosocial Services  No Follow up required      Initial Review   Source of Stress Concerns Unable to participate in former interests or hobbies;Unable to perform yard/household activities;Chronic Illness;Occupation    Comments will continue to monitor and offer support as needed.             Vocational Rehabilitation: Provide vocational rehab assistance to qualifying candidates.   Vocational Rehab Evaluation & Intervention:  Vocational Rehab - 04/07/23 1343       Initial Vocational Rehab Evaluation & Intervention   Assessment shows need for Vocational Rehabilitation No   Dezra is working            Education: Education Goals: Education classes will be provided on a weekly basis, covering required topics. Participant will state understanding/return demonstration of topics presented.    Education     Row Name 04/13/23 1600     Education   Cardiac Education Topics Pritikin   Customer service manager    Weekly Topic International Cuisine- Spotlight on the [employer] Zones   Instruction Review Code 1- Verbalizes Understanding   Class Start Time 1400   Class Stop Time 1448   Class Time Calculation (min) 48 min    Row Name 04/15/23 1500     Education   Cardiac Education Topics Pritikin   Psychologist, forensic Exercise Education   Exercise Education Improving Performance   Instruction Review Code 1- Verbalizes Understanding   Class Start Time 1400   Class Stop Time 1455   Class Time Calculation (min) 55 min    Row Name 04/20/23 1500     Education   Cardiac Education Topics Pritikin   Customer service manager   Weekly Topic Simple Sides and Sauces   Instruction Review Code 1- Verbalizes Understanding   Class Start Time 1400   Class Stop Time 1445   Class Time Calculation (min) 45 min    Row Name 04/27/23 1500     Education   Cardiac Education Topics Pritikin   Customer service manager   Weekly Topic Powerhouse Plant-Based Proteins   Instruction Review Code 1- Verbalizes Understanding   Class Start Time 1400   Class Stop Time 1440   Class Time Calculation (min) 40 min    Row Name 04/29/23 1400     Education   Cardiac Education Topics Pritikin   Hospital doctor Education  General Education Hypertension and Heart Disease   Instruction Review Code 1- Verbalizes Understanding   Class Start Time 1410   Class Stop Time 1445   Class Time Calculation (min) 35 min    Row Name 05/04/23 1500     Education   Cardiac Education Topics Pritikin   Customer service manager   Weekly Topic Adding Flavor - Sodium-Free   Instruction Review Code 1- Verbalizes Understanding   Class Start Time 1400   Class Stop Time 1437   Class Time  Calculation (min) 37 min    Row Name 05/06/23 1400     Education   Cardiac Education Topics Pritikin   Hospital doctor Education   General Education Heart Disease Risk Reduction   Instruction Review Code 1- Verbalizes Understanding   Class Start Time 1355   Class Stop Time 1436   Class Time Calculation (min) 41 min    Row Name 05/11/23 1500     Education   Cardiac Education Topics Pritikin   Customer service manager   Weekly Topic Fast and Healthy Breakfasts   Instruction Review Code 1- Verbalizes Understanding   Class Start Time 1400   Class Stop Time 1445   Class Time Calculation (min) 45 min    Row Name 05/18/23 1500     Education   Cardiac Education Topics Pritikin   Customer service manager   Weekly Topic Personalizing Your Pritikin Plate   Instruction Review Code 1- Verbalizes Understanding   Class Start Time 1400   Class Stop Time 1440   Class Time Calculation (min) 40 min    Row Name 05/20/23 1500     Education   Cardiac Education Topics Pritikin   Select Workshops     Workshops   Educator Exercise Physiologist   Select Exercise   Exercise Workshop Location manager and Fall Prevention   Instruction Review Code 1- Verbalizes Understanding   Class Start Time 1405   Class Stop Time 1455   Class Time Calculation (min) 50 min    Row Name 05/25/23 1600     Education   Cardiac Education Topics Pritikin   Customer service manager   Weekly Topic [employer] Desserts   Instruction Review Code 1- Verbalizes Understanding   Class Start Time 1400   Class Stop Time 1442   Class Time Calculation (min) 42 min    Row Name 05/27/23 1500     Education   Cardiac Education Topics Pritikin   Licensed conveyancer Nutrition    Nutrition Other  label reading   Instruction Review Code 1- Verbalizes Understanding   Class Start Time 1400   Class Stop Time 1500   Class Time Calculation (min) 60 min    Row Name 06/01/23 1500     Education   Cardiac Education Topics Pritikin   Customer service manager   Weekly Topic Tasty Appetizers and Snacks   Instruction Review Code 1- Verbalizes Understanding   Class Start Time 1355   Class Stop Time 1433   Class Time Calculation (min) 38 min  Row Name 06/03/23 1400     Education   Cardiac Education Topics Pritikin   Select Core Videos     Core Videos   Educator Exercise Physiologist   Select Nutrition   Nutrition Calorie Density   Instruction Review Code 1- Verbalizes Understanding   Class Start Time 1358   Class Stop Time 1432   Class Time Calculation (min) 34 min    Row Name 06/08/23 1600     Education   Cardiac Education Topics Pritikin   Customer service manager   Weekly Topic Efficiency Cooking - Meals in a Snap   Instruction Review Code 1- Verbalizes Understanding   Class Start Time 1355   Class Stop Time 1430   Class Time Calculation (min) 35 min            Core Videos: Exercise    Move It!  Clinical staff conducted group or individual video education with verbal and written material and guidebook.  Patient learns the recommended Pritikin exercise program. Exercise with the goal of living a long, healthy life. Some of the health benefits of exercise include controlled diabetes, healthier blood pressure levels, improved cholesterol levels, improved heart and lung capacity, improved sleep, and better body composition. Everyone should speak with their doctor before starting or changing an exercise routine.  Biomechanical Limitations Clinical staff conducted group or individual video education with verbal and written material and guidebook.  Patient learns how  biomechanical limitations can impact exercise and how we can mitigate and possibly overcome limitations to have an impactful and balanced exercise routine.  Body Composition Clinical staff conducted group or individual video education with verbal and written material and guidebook.  Patient learns that body composition (ratio of muscle mass to fat mass) is a key component to assessing overall fitness, rather than body weight alone. Increased fat mass, especially visceral belly fat, can put [place] at increased risk for metabolic syndrome, type 2 diabetes, heart disease, and even death. It is recommended to combine diet and exercise (cardiovascular and resistance training) to improve your body composition. Seek guidance from your physician and exercise physiologist before implementing an exercise routine.  Exercise Action Plan Clinical staff conducted group or individual video education with verbal and written material and guidebook.  Patient learns the recommended strategies to achieve and enjoy long-term exercise adherence, including variety, self-motivation, self-efficacy, and positive decision making. Benefits of exercise include fitness, good health, weight management, more energy, better sleep, less stress, and overall well-being.  Medical   Heart Disease Risk Reduction Clinical staff conducted group or individual video education with verbal and written material and guidebook.  Patient learns our heart is our most vital organ as it circulates oxygen, nutrients, white blood cells, and hormones throughout the entire body, and carries waste away. Data supports a plant-based eating plan like the Pritikin Program for its effectiveness in slowing progression of and reversing heart disease. The video provides a number of recommendations to address heart disease.   Metabolic Syndrome and Belly Fat  Clinical staff conducted group or individual video education with verbal and written material and guidebook.   Patient learns what metabolic syndrome is, how it leads to heart disease, and how one can reverse it and keep it from coming back. You have metabolic syndrome if you have 3 of the following 5 criteria: abdominal obesity, high blood pressure, high triglycerides, low HDL cholesterol, and high blood sugar.  Hypertension and Heart Disease Clinical  staff conducted group or individual video education with verbal and written material and guidebook.  Patient learns that high blood pressure, or hypertension, is very common in the [place]. Hypertension is largely due to excessive salt intake, but other important risk factors include being overweight, physical inactivity, drinking too much alcohol, smoking, and not eating enough potassium from fruits and vegetables. High blood pressure is a leading risk factor for heart attack, stroke, congestive heart failure, dementia, kidney failure, and premature death. Long-term effects of excessive salt intake include stiffening of the arteries and thickening of heart muscle and organ damage. Recommendations include ways to reduce hypertension and the risk of heart disease.  Diseases of Our Time - Focusing on Diabetes Clinical staff conducted group or individual video education with verbal and written material and guidebook.  Patient learns why the best way to stop diseases of our time is prevention, through food and other lifestyle changes. Medicine (such as prescription pills and surgeries) is often only a Band-Aid on the problem, not a long-term solution. Most common diseases of our time include obesity, type 2 diabetes, hypertension, heart disease, and cancer. The Pritikin Program is recommended and has been proven to help reduce, reverse, and/or prevent the damaging effects of metabolic syndrome.  Nutrition   Overview of the Pritikin Eating Plan  Clinical staff conducted group or individual video education with verbal and written material and guidebook.  Patient  learns about the Pritikin Eating Plan for disease risk reduction. The Pritikin Eating Plan emphasizes a wide variety of unrefined, minimally-processed carbohydrates, like fruits, vegetables, whole grains, and legumes. Go, Caution, and Stop food choices are explained. Plant-based and lean animal proteins are emphasized. Rationale provided for low sodium intake for blood pressure control, low added sugars for blood sugar stabilization, and low added fats and oils for coronary artery disease risk reduction and weight management.  Calorie Density  Clinical staff conducted group or individual video education with verbal and written material and guidebook.  Patient learns about calorie density and how it impacts the Pritikin Eating Plan. Knowing the characteristics of the food you choose will help you decide whether those foods will lead to weight gain or weight loss, and whether you want to consume more or less of them. Weight loss is usually a side effect of the Pritikin Eating Plan because of its focus on low calorie-dense foods.  Label Reading  Clinical staff conducted group or individual video education with verbal and written material and guidebook.  Patient learns about the Pritikin recommended label reading guidelines and corresponding recommendations regarding calorie density, added sugars, sodium content, and whole grains.  Dining Out - Part 1  Clinical staff conducted group or individual video education with verbal and written material and guidebook.  Patient learns that restaurant meals can be sabotaging because they can be so high in calories, fat, sodium, and/or sugar. Patient learns recommended strategies on how to positively address this and avoid unhealthy pitfalls.  Facts on Fats  Clinical staff conducted group or individual video education with verbal and written material and guidebook.  Patient learns that lifestyle modifications can be just as effective, if not more so, as many  medications for lowering your risk of heart disease. A Pritikin lifestyle can help to reduce your risk of inflammation and atherosclerosis (cholesterol build-up, or plaque, in the artery walls). Lifestyle interventions such as dietary choices and physical activity address the cause of atherosclerosis. A review of the types of fats and their impact on blood cholesterol  levels, along with dietary recommendations to reduce fat intake is also included.  Nutrition Action Plan  Clinical staff conducted group or individual video education with verbal and written material and guidebook.  Patient learns how to incorporate Pritikin recommendations into their lifestyle. Recommendations include planning and keeping personal health goals in mind as an important part of their success.  Healthy Mind-Set    Healthy Minds, Bodies, Hearts  Clinical staff conducted group or individual video education with verbal and written material and guidebook.  Patient learns how to identify when they are stressed. Video will discuss the impact of that stress, as well as the many benefits of stress management. Patient will also be introduced to stress management techniques. The way we think, act, and feel has an impact on our hearts.  How Our Thoughts Can Heal Our Hearts  Clinical staff conducted group or individual video education with verbal and written material and guidebook.  Patient learns that negative thoughts can cause depression and anxiety. This can result in negative lifestyle behavior and serious health problems. Cognitive behavioral therapy is an effective method to help control our thoughts in order to change and improve our emotional outlook.  Additional Videos:  Exercise    Improving Performance  Clinical staff conducted group or individual video education with verbal and written material and guidebook.  Patient learns to use a non-linear approach by alternating intensity levels and lengths of time spent  exercising to help burn more calories and lose more body fat. Cardiovascular exercise helps improve heart health, metabolism, hormonal balance, blood sugar control, and recovery from fatigue. Resistance training improves strength, endurance, balance, coordination, reaction time, metabolism, and muscle mass. Flexibility exercise improves circulation, posture, and balance. Seek guidance from your physician and exercise physiologist before implementing an exercise routine and learn your capabilities and proper form for all exercise.  Introduction to Yoga  Clinical staff conducted group or individual video education with verbal and written material and guidebook.  Patient learns about yoga, a discipline of the coming together of mind, breath, and body. The benefits of yoga include improved flexibility, improved range of motion, better posture and core strength, increased lung function, weight loss, and positive self-image. Yoga's heart health benefits include lowered blood pressure, healthier heart rate, decreased cholesterol and triglyceride levels, improved immune function, and reduced stress. Seek guidance from your physician and exercise physiologist before implementing an exercise routine and learn your capabilities and proper form for all exercise.  Medical   Aging: Enhancing Your Quality of Life  Clinical staff conducted group or individual video education with verbal and written material and guidebook.  Patient learns key strategies and recommendations to stay in good physical health and enhance quality of life, such as prevention strategies, having an advocate, securing a Health Care Proxy and Power of Attorney, and keeping a list of medications and system for tracking them. It also discusses how to avoid risk for bone loss.  Biology of Weight Control  Clinical staff conducted group or individual video education with verbal and written material and guidebook.  Patient learns that weight gain occurs  because we consume more calories than we burn (eating more, moving less). Even if your body weight is normal, you may have higher ratios of fat compared to muscle mass. Too much body fat puts you at increased risk for cardiovascular disease, heart attack, stroke, type 2 diabetes, and obesity-related cancers. In addition to exercise, following the Pritikin Eating Plan can help reduce your risk.  Decoding Lab Results  Clinical staff conducted group or individual video education with verbal and written material and guidebook.  Patient learns that lab test reflects one measurement whose values change over time and are influenced by many factors, including medication, stress, sleep, exercise, food, hydration, pre-existing medical conditions, and more. It is recommended to use the knowledge from this video to become more involved with your lab results and evaluate your numbers to speak with your doctor.   Diseases of Our Time - Overview  Clinical staff conducted group or individual video education with verbal and written material and guidebook.  Patient learns that according to the CDC, 50% to 70% of chronic diseases (such as obesity, type 2 diabetes, elevated lipids, hypertension, and heart disease) are avoidable through lifestyle improvements including healthier food choices, listening to satiety cues, and increased physical activity.  Sleep Disorders Clinical staff conducted group or individual video education with verbal and written material and guidebook.  Patient learns how good quality and duration of sleep are important to overall health and well-being. Patient also learns about sleep disorders and how they impact health along with recommendations to address them, including discussing with a physician.  Nutrition  Dining Out - Part 2 Clinical staff conducted group or individual video education with verbal and written material and guidebook.  Patient learns how to plan ahead and communicate in  order to maximize their dining experience in a healthy and nutritious manner. Included are recommended food choices based on the type of restaurant the patient is visiting.   Fueling a Banker conducted group or individual video education with verbal and written material and guidebook.  There is a strong connection between our food choices and our health. Diseases like obesity and type 2 diabetes are very prevalent and are in large-part due to lifestyle choices. The Pritikin Eating Plan provides plenty of food and hunger-curbing satisfaction. It is easy to follow, affordable, and helps reduce health risks.  Menu Workshop  Clinical staff conducted group or individual video education with verbal and written material and guidebook.  Patient learns that restaurant meals can sabotage health goals because they are often packed with calories, fat, sodium, and sugar. Recommendations include strategies to plan ahead and to communicate with the manager, chef, or server to help order a healthier meal.  Planning Your Eating Strategy  Clinical staff conducted group or individual video education with verbal and written material and guidebook.  Patient learns about the Pritikin Eating Plan and its benefit of reducing the risk of disease. The Pritikin Eating Plan does not focus on calories. Instead, it emphasizes high-quality, nutrient-rich foods. By knowing the characteristics of the foods, we choose, we can determine their calorie density and make informed decisions.  Targeting Your Nutrition Priorities  Clinical staff conducted group or individual video education with verbal and written material and guidebook.  Patient learns that lifestyle habits have a tremendous impact on disease risk and progression. This video provides eating and physical activity recommendations based on your personal health goals, such as reducing LDL cholesterol, losing weight, preventing or controlling type 2  diabetes, and reducing high blood pressure.  Vitamins and Minerals  Clinical staff conducted group or individual video education with verbal and written material and guidebook.  Patient learns different ways to obtain key vitamins and minerals, including through a recommended healthy diet. It is important to discuss all supplements you take with your doctor.   Healthy Mind-Set    Smoking Cessation  Clinical staff conducted group or  individual video education with verbal and written material and guidebook.  Patient learns that cigarette smoking and tobacco addiction pose a serious health risk which affects millions of people. Stopping smoking will significantly reduce the risk of heart disease, lung disease, and many forms of cancer. Recommended strategies for quitting are covered, including working with your doctor to develop a successful plan.  Culinary   Becoming a Set designer conducted group or individual video education with verbal and written material and guidebook.  Patient learns that cooking at home can be healthy, cost-effective, quick, and puts them in control. Keys to cooking healthy recipes will include looking at your recipe, assessing your equipment needs, planning ahead, making it simple, choosing cost-effective seasonal ingredients, and limiting the use of added fats, salts, and sugars.  Cooking - Breakfast and Snacks  Clinical staff conducted group or individual video education with verbal and written material and guidebook.  Patient learns how important breakfast is to satiety and nutrition through the entire day. Recommendations include key foods to eat during breakfast to help stabilize blood sugar levels and to prevent overeating at meals later in the day. Planning ahead is also a key component.  Cooking - Educational psychologist conducted group or individual video education with verbal and written material and guidebook.  Patient learns eating  strategies to improve overall health, including an approach to cook more at home. Recommendations include thinking of animal protein as a side on your plate rather than center stage and focusing instead on lower calorie dense options like vegetables, fruits, whole grains, and plant-based proteins, such as beans. Making sauces in large quantities to freeze for later and leaving the skin on your vegetables are also recommended to maximize your experience.  Cooking - Healthy Salads and Dressing Clinical staff conducted group or individual video education with verbal and written material and guidebook.  Patient learns that vegetables, fruits, whole grains, and legumes are the foundations of the Pritikin Eating Plan. Recommendations include how to incorporate each of these in flavorful and healthy salads, and how to create homemade salad dressings. Proper handling of ingredients is also covered. Cooking - Soups and [employer] - Soups and Desserts Clinical staff conducted group or individual video education with verbal and written material and guidebook.  Patient learns that Pritikin soups and desserts make for easy, nutritious, and delicious snacks and meal components that are low in sodium, fat, sugar, and calorie density, while high in vitamins, minerals, and filling fiber. Recommendations include simple and healthy ideas for soups and desserts.   Overview     The Pritikin Solution Program Overview Clinical staff conducted group or individual video education with verbal and written material and guidebook.  Patient learns that the results of the Pritikin Program have been documented in more than 100 articles published in peer-reviewed journals, and the benefits include reducing risk factors for (and, in some cases, even reversing) high cholesterol, high blood pressure, type 2 diabetes, obesity, and more! An overview of the three key pillars of the Pritikin Program will be covered: eating well, doing  regular exercise, and having a healthy mind-set.  WORKSHOPS  Exercise: Exercise Basics: Building Your Action Plan Clinical staff led group instruction and group discussion with PowerPoint presentation and patient guidebook. To enhance the learning environment the use of posters, models and videos may be added. At the conclusion of this workshop, patients will comprehend the difference between physical activity and exercise, as well as the  benefits of incorporating both, into their routine. Patients will understand the FITT (Frequency, Intensity, Time, and Type) principle and how to use it to build an exercise action plan. In addition, safety concerns and other considerations for exercise and cardiac rehab will be addressed by the presenter. The purpose of this lesson is to promote a comprehensive and effective weekly exercise routine in order to improve patients' overall level of fitness.   Managing Heart Disease: Your Path to a Healthier Heart Clinical staff led group instruction and group discussion with PowerPoint presentation and patient guidebook. To enhance the learning environment the use of posters, models and videos may be added.At the conclusion of this workshop, patients will understand the anatomy and physiology of the heart. Additionally, they will understand how Pritikin's three pillars impact the risk factors, the progression, and the management of heart disease.  The purpose of this lesson is to provide a high-level overview of the heart, heart disease, and how the Pritikin lifestyle positively impacts risk factors.  Exercise Biomechanics Clinical staff led group instruction and group discussion with PowerPoint presentation and patient guidebook. To enhance the learning environment the use of posters, models and videos may be added. Patients will learn how the structural parts of their bodies function and how these functions impact their daily activities, movement, and exercise.  Patients will learn how to promote a neutral spine, learn how to manage pain, and identify ways to improve their physical movement in order to promote healthy living. The purpose of this lesson is to expose patients to common physical limitations that impact physical activity. Participants will learn practical ways to adapt and manage aches and pains, and to minimize their effect on regular exercise. Patients will learn how to maintain good posture while sitting, walking, and lifting.  Balance Training and Fall Prevention  Clinical staff led group instruction and group discussion with PowerPoint presentation and patient guidebook. To enhance the learning environment the use of posters, models and videos may be added. At the conclusion of this workshop, patients will understand the importance of their sensorimotor skills (vision, proprioception, and the vestibular system) in maintaining their ability to balance as they age. Patients will apply a variety of balancing exercises that are appropriate for their current level of function. Patients will understand the common causes for poor balance, possible solutions to these problems, and ways to modify their physical environment in order to minimize their fall risk. The purpose of this lesson is to teach patients about the importance of maintaining balance as they age and ways to minimize their risk of falling.  WORKSHOPS   Nutrition:  Fueling a Ship broker led group instruction and group discussion with PowerPoint presentation and patient guidebook. To enhance the learning environment the use of posters, models and videos may be added. Patients will review the foundational principles of the Pritikin Eating Plan and understand what constitutes a serving size in each of the food groups. Patients will also learn Pritikin-friendly foods that are better choices when away from home and review make-ahead meal and snack options. Calorie  density will be reviewed and applied to three nutrition priorities: weight maintenance, weight loss, and weight gain. The purpose of this lesson is to reinforce (in a group setting) the key concepts around what patients are recommended to eat and how to apply these guidelines when away from home by planning and selecting Pritikin-friendly options. Patients will understand how calorie density may be adjusted for different weight management goals.  Mindful Eating  Clinical staff led group instruction and group discussion with PowerPoint presentation and patient guidebook. To enhance the learning environment the use of posters, models and videos may be added. Patients will briefly review the concepts of the Pritikin Eating Plan and the importance of low-calorie dense foods. The concept of mindful eating will be introduced as well as the importance of paying attention to internal hunger signals. Triggers for non-hunger eating and techniques for dealing with triggers will be explored. The purpose of this lesson is to provide patients with the opportunity to review the basic principles of the Pritikin Eating Plan, discuss the value of eating mindfully and how to measure internal cues of hunger and fullness using the Hunger Scale. Patients will also discuss reasons for non-hunger eating and learn strategies to use for controlling emotional eating.  Targeting Your Nutrition Priorities Clinical staff led group instruction and group discussion with PowerPoint presentation and patient guidebook. To enhance the learning environment the use of posters, models and videos may be added. Patients will learn how to determine their genetic susceptibility to disease by reviewing their family history. Patients will gain insight into the importance of diet as part of an overall healthy lifestyle in mitigating the impact of genetics and other environmental insults. The purpose of this lesson is to provide patients with the  opportunity to assess their personal nutrition priorities by looking at their family history, their own health history and current risk factors. Patients will also be able to discuss ways of prioritizing and modifying the Pritikin Eating Plan for their highest risk areas  Menu  Clinical staff led group instruction and group discussion with PowerPoint presentation and patient guidebook. To enhance the learning environment the use of posters, models and videos may be added. Using menus brought in from [employer], or printed from [employer], patients will apply the Pritikin dining out guidelines that were presented in the [employer] video. Patients will also be able to practice these guidelines in a variety of provided scenarios. The purpose of this lesson is to provide patients with the opportunity to practice hands-on learning of the Pritikin Dining Out guidelines with actual menus and practice scenarios.  Label Reading Clinical staff led group instruction and group discussion with PowerPoint presentation and patient guidebook. To enhance the learning environment the use of posters, models and videos may be added. Patients will review and discuss the Pritikin label reading guidelines presented in Pritikin's Label Reading Educational series video. Using fool labels brought in from local grocery stores and markets, patients will apply the label reading guidelines and determine if the packaged food meet the Pritikin guidelines. The purpose of this lesson is to provide patients with the opportunity to review, discuss, and practice hands-on learning of the Pritikin Label Reading guidelines with actual packaged food labels. Cooking School  Pritikin's [employer] are designed to teach patients ways to prepare quick, simple, and affordable recipes at home. The importance of nutrition's role in chronic disease risk reduction is reflected in its emphasis in the overall  Pritikin program. By learning how to prepare essential core Pritikin Eating Plan recipes, patients will increase control over what they eat; be able to customize the flavor of foods without the use of added salt, sugar, or fat; and improve the quality of the food they consume. By learning a set of core recipes which are easily assembled, quickly prepared, and affordable, patients are more likely to prepare more healthy foods at home. These workshops focus on  convenient breakfasts, simple entres, side dishes, and desserts which can be prepared with minimal effort and are consistent with nutrition recommendations for cardiovascular risk reduction. Cooking [employer] are taught by a Armed forces logistics/support/administrative officer (RD) who has been trained by the [employer]. The chef or RD has a clear understanding of the importance of minimizing - if not completely eliminating - added fat, sugar, and sodium in recipes. Throughout the series of Cooking School Workshop sessions, patients will learn about healthy ingredients and efficient methods of cooking to build confidence in their capability to prepare    Cooking School weekly topics:  Adding Flavor- Sodium-Free  Fast and Healthy Breakfasts  Powerhouse Plant-Based Proteins  Satisfying Salads and Dressings  Simple Sides and Sauces  International Cuisine-Spotlight on the [employer] Zones  Delicious Desserts  Savory Soups  [employer] - Meals in a Astronomer Appetizers and Snacks  Comforting Weekend Breakfasts  One-Pot Wonders   Fast Evening Meals  Landscape architect Your Pritikin Plate  WORKSHOPS   Healthy Mindset (Psychosocial):  Focused Goals, Sustainable Changes Clinical staff led group instruction and group discussion with PowerPoint presentation and patient guidebook. To enhance the learning environment the use of posters, models and videos may be added. Patients will be able to apply effective goal setting strategies  to establish at least one personal goal, and then take consistent, meaningful action toward that goal. They will learn to identify common barriers to achieving personal goals and develop strategies to overcome them. Patients will also gain an understanding of how our mind-set can impact our ability to achieve goals and the importance of cultivating a positive and growth-oriented mind-set. The purpose of this lesson is to provide patients with a deeper understanding of how to set and achieve personal goals, as well as the tools and strategies needed to overcome common obstacles which may arise along the way.  From Head to Heart: The Power of a Healthy Outlook  Clinical staff led group instruction and group discussion with PowerPoint presentation and patient guidebook. To enhance the learning environment the use of posters, models and videos may be added. Patients will be able to recognize and describe the impact of emotions and mood on physical health. They will discover the importance of self-care and explore self-care practices which may work for them. Patients will also learn how to utilize the 4 C's to cultivate a healthier outlook and better manage stress and challenges. The purpose of this lesson is to demonstrate to patients how a healthy outlook is an essential part of maintaining good health, especially as they continue their cardiac rehab journey.  Healthy Sleep for a Healthy Heart Clinical staff led group instruction and group discussion with PowerPoint presentation and patient guidebook. To enhance the learning environment the use of posters, models and videos may be added. At the conclusion of this workshop, patients will be able to demonstrate knowledge of the importance of sleep to overall health, well-being, and quality of life. They will understand the symptoms of, and treatments for, common sleep disorders. Patients will also be able to identify daytime and nighttime behaviors which impact  sleep, and they will be able to apply these tools to help manage sleep-related challenges. The purpose of this lesson is to provide patients with a general overview of sleep and outline the importance of quality sleep. Patients will learn about a few of the most common sleep disorders. Patients will also be introduced to the concept of "sleep hygiene,"  and discover ways to self-manage certain sleeping problems through simple daily behavior changes. Finally, the workshop will motivate patients by clarifying the links between quality sleep and their goals of heart-healthy living.   Recognizing and Reducing Stress Clinical staff led group instruction and group discussion with PowerPoint presentation and patient guidebook. To enhance the learning environment the use of posters, models and videos may be added. At the conclusion of this workshop, patients will be able to understand the types of stress reactions, differentiate between acute and chronic stress, and recognize the impact that chronic stress has on their health. They will also be able to apply different coping mechanisms, such as reframing negative self-talk. Patients will have the opportunity to practice a variety of stress management techniques, such as deep abdominal breathing, progressive muscle relaxation, and/or guided imagery.  The purpose of this lesson is to educate patients on the role of stress in their lives and to provide healthy techniques for coping with it.  Learning Barriers/Preferences:  Learning Barriers/Preferences - 04/07/23 1354       Learning Barriers/Preferences   Learning Barriers Sight   wears glasses   Learning Preferences Audio;Group Instruction;Individual Instruction;Pictoral;Skilled Demonstration;Verbal Instruction;Video;Written Material             Education Topics:  Knowledge Questionnaire Score:  Knowledge Questionnaire Score - 04/07/23 1352       Knowledge Questionnaire Score   Pre Score 18/24              Core Components/Risk Factors/Patient Goals at Admission:  Personal Goals and Risk Factors at Admission - 04/07/23 1352       Core Components/Risk Factors/Patient Goals on Admission    Weight Management Yes;Weight Loss    Intervention Weight Management: Develop a combined nutrition and exercise program designed to reach desired caloric intake, while maintaining appropriate intake of nutrient and fiber, sodium and fats, and appropriate energy expenditure required for the weight goal.;Weight Management: Provide education and appropriate resources to help participant work on and attain dietary goals.    Expected Outcomes Short Term: Continue to assess and modify interventions until short term weight is achieved;Long Term: Adherence to nutrition and physical activity/exercise program aimed toward attainment of established weight goal;Weight Loss: Understanding of general recommendations for a balanced deficit meal plan, which promotes 1-2 lb weight loss per week and includes a negative energy balance of [phone] kcal/d;Understanding recommendations for meals to include 15-35% energy as protein, 25-35% energy from fat, 35-60% energy from carbohydrates, less than 200mg  of dietary cholesterol, 20-35 gm of total fiber daily;Understanding of distribution of calorie intake throughout the day with the consumption of 4-5 meals/snacks    Diabetes Yes    Intervention Provide education about signs/symptoms and action to take for hypo/hyperglycemia.;Provide education about proper nutrition, including hydration, and aerobic/resistive exercise prescription along with prescribed medications to achieve blood glucose in normal ranges: Fasting glucose 65-99 mg/dL    Expected Outcomes Short Term: Participant verbalizes understanding of the signs/symptoms and immediate care of hyper/hypoglycemia, proper foot care and importance of medication, aerobic/resistive exercise and nutrition plan for blood glucose  control.;Long Term: Attainment of HbA1C < 7%.    Hypertension Yes    Intervention Provide education on lifestyle modifcations including regular physical activity/exercise, weight management, moderate sodium restriction and increased consumption of fresh fruit, vegetables, and low fat dairy, alcohol moderation, and smoking cessation.;Monitor prescription use compliance.    Expected Outcomes Short Term: Continued assessment and intervention until BP is < 140/19mm HG in hypertensive participants. < 130/79mm HG  in hypertensive participants with diabetes, heart failure or chronic kidney disease.;Long Term: Maintenance of blood pressure at goal levels.    Lipids Yes    Intervention Provide education and support for participant on nutrition & aerobic/resistive exercise along with prescribed medications to achieve LDL 70mg , HDL >40mg .    Expected Outcomes Short Term: Participant states understanding of desired cholesterol values and is compliant with medications prescribed. Participant is following exercise prescription and nutrition guidelines.;Long Term: Cholesterol controlled with medications as prescribed, with individualized exercise RX and with personalized nutrition plan. Value goals: LDL < 70mg , HDL > 40 mg.    Stress Yes    Intervention Offer individual and/or small group education and counseling on adjustment to heart disease, stress management and health-related lifestyle change. Teach and support self-help strategies.;Refer participants experiencing significant psychosocial distress to appropriate mental health specialists for further evaluation and treatment. When possible, include family members and significant others in education/counseling sessions.    Expected Outcomes Short Term: Participant demonstrates changes in health-related behavior, relaxation and other stress management skills, ability to obtain effective social support, and compliance with psychotropic medications if prescribed.;Long  Term: Emotional wellbeing is indicated by absence of clinically significant psychosocial distress or social isolation.             Core Components/Risk Factors/Patient Goals Review:   Goals and Risk Factor Review     Row Name 04/13/23 1650 05/10/23 1504 06/08/23 1523         Core Components/Risk Factors/Patient Goals Review   Personal Goals Review Weight Management/Obesity;Stress;Hypertension;Lipids;Diabetes Weight Management/Obesity;Stress;Hypertension;Lipids;Diabetes Weight Management/Obesity;Stress;Hypertension;Lipids;Diabetes     Review Rinoa started cardiac rehab on 04/13/23 and did fair with exercise for her fitness level. Vital signs. Patient reported some genralized weakness deined shortness of breath. Encouraged to increase hydration. Kale is deconditioned. Serine continues to do fair with exercise for her fitness level at  Vital signs have been stable. Elnore continues to do fair with exercise for her fitness level at  Vital signs have been stable. Camyla's has a CGM her metformin was decreased to 500 mg twice a day. Aaliana says she is not having any further low CBG's at home. Lorrinda has lost 1.5 kg since starting cardiac rehab.     Expected Outcomes Steve will continue to participate in cardiac rehab for exercise, nutrition and lifestyle modifications Lexy will continue to participate in cardiac rehab for exercise, nutrition and lifestyle modifications Aaliah will continue to participate in cardiac rehab for exercise, nutrition and lifestyle modifications              Core Components/Risk Factors/Patient Goals at Discharge (Final Review):   Goals and Risk Factor Review - 06/08/23 1523       Core Components/Risk Factors/Patient Goals Review   Personal Goals Review Weight Management/Obesity;Stress;Hypertension;Lipids;Diabetes    Review Lariza continues to do fair with exercise for her fitness level at  Vital signs have been stable. Eladia's has a CGM her metformin was decreased to 500 mg twice  a day. Kidada says she is not having any further low CBG's at home. Caci has lost 1.5 kg since starting cardiac rehab.    Expected Outcomes Nikoleta will continue to participate in cardiac rehab for exercise, nutrition and lifestyle modifications             ITP Comments:  ITP Comments     Row Name 04/07/23 1327 04/13/23 1646 05/10/23 1409 06/08/23 1512     ITP Comments Rondell Reams, MD: Medical Director. Introduction to the [facility] /  Intensive Cardiac Rehab.  Initial orientation packet reviewed with the patient today. 30 Day ITP comment. Ann-Marie started cardiac rehab on 04/13/23 and did well wiht exercise for her fitness level 30 Day ITP comment. Kariyah has good participation in cardiac rehab when in attendance 30 Day ITP comment. Sharlynn has good attendance and participation in cardiac rehab.             Comments: See ITP comments.Thayer Headings RN BSN

## 2023-06-10 ENCOUNTER — Other Ambulatory Visit (HOSPITAL_COMMUNITY): Payer: Self-pay

## 2023-06-10 ENCOUNTER — Encounter (HOSPITAL_COMMUNITY)
Admission: RE | Admit: 2023-06-10 | Discharge: 2023-06-10 | Disposition: A | Discharge status: Home / Self Care | Payer: PPO | Source: Ambulatory Visit | Attending: Cardiovascular Disease

## 2023-06-10 ENCOUNTER — Encounter (HOSPITAL_COMMUNITY): Payer: PPO

## 2023-06-10 DIAGNOSIS — Z955 Presence of coronary angioplasty implant and graft: Secondary | ICD-10-CM

## 2023-06-10 DIAGNOSIS — I214 Non-ST elevation (NSTEMI) myocardial infarction: Secondary | ICD-10-CM

## 2023-06-13 ENCOUNTER — Encounter (HOSPITAL_COMMUNITY): Payer: PPO

## 2023-06-15 ENCOUNTER — Encounter (HOSPITAL_COMMUNITY)
Admission: RE | Admit: 2023-06-15 | Discharge: 2023-06-15 | Disposition: A | Discharge status: Home / Self Care | Payer: PPO | Source: Ambulatory Visit | Attending: Cardiovascular Disease | Admitting: Cardiovascular Disease

## 2023-06-15 ENCOUNTER — Other Ambulatory Visit: Payer: Self-pay

## 2023-06-15 ENCOUNTER — Encounter (HOSPITAL_COMMUNITY): Payer: PPO

## 2023-06-15 ENCOUNTER — Other Ambulatory Visit (HOSPITAL_COMMUNITY): Payer: Self-pay

## 2023-06-15 DIAGNOSIS — I214 Non-ST elevation (NSTEMI) myocardial infarction: Secondary | ICD-10-CM

## 2023-06-15 DIAGNOSIS — Z955 Presence of coronary angioplasty implant and graft: Secondary | ICD-10-CM | POA: Insufficient documentation

## 2023-06-15 DIAGNOSIS — I252 Old myocardial infarction: Secondary | ICD-10-CM | POA: Insufficient documentation

## 2023-06-15 DIAGNOSIS — Z48812 Encounter for surgical aftercare following surgery on the circulatory system: Secondary | ICD-10-CM | POA: Diagnosis not present

## 2023-06-15 MED ORDER — BENAZEPRIL-HYDROCHLOROTHIAZIDE 20-25 MG PO TABS
1.0000 | ORAL_TABLET | Freq: Every day | ORAL | 1 refills | Status: DC
Start: 2023-06-15 — End: 2023-11-28
  Filled 2023-06-15: qty 90, 90d supply, fill #0
  Filled 2023-08-31: qty 90, 90d supply, fill #1

## 2023-06-16 ENCOUNTER — Other Ambulatory Visit (HOSPITAL_COMMUNITY): Payer: Self-pay

## 2023-06-16 MED ORDER — METFORMIN HCL 500 MG PO TABS
500.0000 mg | ORAL_TABLET | Freq: Two times a day (BID) | ORAL | 1 refills | Status: DC
  Filled 2023-06-16: qty 180, 90d supply, fill #0
  Filled 2023-09-02: qty 180, 90d supply, fill #1

## 2023-06-17 ENCOUNTER — Encounter (HOSPITAL_COMMUNITY)
Admission: RE | Admit: 2023-06-17 | Discharge: 2023-06-17 | Disposition: A | Discharge status: Home / Self Care | Payer: PPO | Source: Ambulatory Visit | Attending: Cardiovascular Disease

## 2023-06-17 ENCOUNTER — Other Ambulatory Visit (HOSPITAL_COMMUNITY): Payer: Self-pay

## 2023-06-17 ENCOUNTER — Encounter (HOSPITAL_COMMUNITY): Payer: PPO

## 2023-06-17 DIAGNOSIS — Z955 Presence of coronary angioplasty implant and graft: Secondary | ICD-10-CM | POA: Diagnosis not present

## 2023-06-17 DIAGNOSIS — I214 Non-ST elevation (NSTEMI) myocardial infarction: Secondary | ICD-10-CM

## 2023-06-18 LAB — HEPATIC FUNCTION PANEL
ALT: 26 [IU]/L (ref 0–32)
AST: 25 [IU]/L (ref 0–40)
Albumin: 4.3 g/dL (ref 3.8–4.8)
Alkaline Phosphatase: 50 [IU]/L (ref 44–121)
Bilirubin Total: 0.6 mg/dL (ref 0.0–1.2)
Bilirubin, Direct: 0.18 mg/dL (ref 0.00–0.40)
Total Protein: 6.6 g/dL (ref 6.0–8.5)

## 2023-06-18 LAB — LIPID PANEL
Chol/HDL Ratio: 1.9 {ratio} (ref 0.0–4.4)
Cholesterol, Total: 113 mg/dL (ref 100–199)
HDL: 58 mg/dL (ref >39)
LDL Chol Calc (NIH): 41 mg/dL (ref 0–99)
Triglycerides: 66 mg/dL (ref 0–149)
VLDL Cholesterol Cal: 14 mg/dL (ref 5–40)

## 2023-06-20 ENCOUNTER — Encounter (HOSPITAL_COMMUNITY): Payer: PPO

## 2023-06-22 ENCOUNTER — Encounter (HOSPITAL_COMMUNITY)
Admission: RE | Admit: 2023-06-22 | Discharge: 2023-06-22 | Disposition: A | Discharge status: Home / Self Care | Payer: PPO | Source: Ambulatory Visit | Attending: Cardiovascular Disease

## 2023-06-22 ENCOUNTER — Encounter (HOSPITAL_COMMUNITY): Payer: PPO

## 2023-06-22 DIAGNOSIS — Z955 Presence of coronary angioplasty implant and graft: Secondary | ICD-10-CM

## 2023-06-22 DIAGNOSIS — I214 Non-ST elevation (NSTEMI) myocardial infarction: Secondary | ICD-10-CM

## 2023-06-22 DIAGNOSIS — Z006 Encounter for examination for normal comparison and control in clinical research program: Secondary | ICD-10-CM

## 2023-06-22 MED ORDER — STUDY - LIBREXIA-ACS - MILVEXIAN 25 MG OR PLACEBO TABLET (PI-STUCKEY): 1.0000 | ORAL_TABLET | Freq: Two times a day (BID) | ORAL | 0 refills | Status: DC

## 2023-06-22 NOTE — Research (Cosign Needed Addendum)
 [facility] ACS 13 WEEK VISIT    [facility] Hematology/Chemistry drawn: [x] Yes [] No   Medication Kit Accountability Dispensed Status [x] Dispensed [] Dispensed In Error [] Not Dispensed If 'Not Dispensed', provide reason: [] Medical Decision [] Medication Kit not available or damaged [] Study medication discontinued [] Visit skipped or administration skipped [] Medication kit dispensed in error Date Dispensed:09/Oct/2024 Amount Dispensed: [id number] (Milvexian) 25mg  or Placebo -   [phone],  [phone],  [phone],  [phone]  Return Status: [] Damaged/Returned by subject [] Missing/Not returned by subject [x] Returned by subject If not returned reasons: [] Forgot [x] Lost  Subject threw away empty bottle # [phone] home and returned 2 bottles in clinic.    Date Returned: 09/Oct/2024 Amount Returned:  [id number] (Milvexian) 25mg  or Placebo -   [phone] tablets    [phone] lost    [phone] tablets   Compliance%: 100   Were any suspected endpoint events or adverse events experienced? [] Yes [x] No    Current Outpatient Medications:    albuterol  (VENTOLIN  HFA) 108 (90 Base) MCG/ACT inhaler, Inhale 1 puff into the lungs every 4 (four) hours as needed., Disp: 6.7 g, Rfl: 1   aspirin  EC 81 MG tablet, Take 1 tablet (81 mg total) by mouth daily. Swallow whole., Disp: 120 tablet, Rfl: 3   atorvastatin  (LIPITOR) 40 MG tablet, Take 1 tablet (40 mg total) by mouth daily., Disp: 30 tablet, Rfl: 3   azelastine (ASTELIN) 137 MCG/SPRAY nasal spray, Place 1 spray into the nose daily as needed for allergies (post nasal drip). Use in each nostril as directed, Disp: , Rfl:    benazepril -hydrochlorthiazide (LOTENSIN  HCT) 20-25 MG per tablet, Take 1 tablet by mouth daily., Disp: , Rfl:    benazepril -hydrochlorthiazide (LOTENSIN  HCT) 20-25 MG tablet, Take 1 tablet by mouth daily., Disp: 90 tablet, Rfl: 1   Beta Carotene (VITAMIN A) 25000 UNIT capsule, Take 25,000 Units by mouth daily., Disp: ,  Rfl:    clonazePAM  (KLONOPIN ) 0.5 MG tablet, Take 0.25 mg by mouth at bedtime as needed for anxiety (sleep)., Disp: , Rfl:    Continuous Glucose Receiver (FREESTYLE LIBRE 2 READER) DEVI, Use to monitor blood glucose continouously, Disp: 2 each, Rfl: 5   Continuous Glucose Sensor (FREESTYLE LIBRE 2 SENSOR) MISC, Use as directed, Disp: 2 each, Rfl: 5   glucose blood (FREESTYLE TEST STRIPS) test strip, USE AS INSTRUCTED TO CHECK BLOOD SUGAR ONCE DAILY DX CODE E11.9, Disp: 50 each, Rfl: 0   levothyroxine  (SYNTHROID ) 50 MCG tablet, Take 1 tablet (50 mcg total) by mouth every morning on an empty stomach, Disp: 90 tablet, Rfl: 0   metFORMIN  (GLUCOPHAGE ) 500 MG tablet, Take 1 tablet (500 mg total) by mouth 2 (two) times daily with a meal., Disp: 180 tablet, Rfl: 1   metoprolol  succinate (TOPROL -XL) 25 MG 24 hr tablet, Take 1 tablet (25 mg total) by mouth daily., Disp: 30 tablet, Rfl: 3   nitroGLYCERIN  (NITROSTAT ) 0.4 MG SL tablet, Place 1 tablet (0.4 mg total) under the tongue every 5 (five) minutes x 3 doses as needed for chest pain., Disp: 25 tablet, Rfl: 0   potassium chloride  SA (K-DUR,KLOR-CON ) 20 MEQ tablet, Take 1 tablet (20 mEq total) by mouth daily. (Patient taking differently: Take 20 mEq by mouth daily with lunch.), Disp: 30 tablet, Rfl: 3   PROAIR  HFA 108 (90 BASE) MCG/ACT inhaler, INHALE 1 TO 2 PUFFS BY MOUTH EVERY 6 HOURS AS NEEDED, Disp: , Rfl: 1   Study - LIBREXIA-ACS - milvexian 25 mg or placebo tablet (PI-Stuckey), Take 1 tablet by mouth 2 (two) times daily.  Take with or without food. Bring bottle back to every visit., Disp: 280 tablet, Rfl: 0   ticagrelor  (BRILINTA ) 90 MG TABS tablet, Take 1 tablet (90 mg total) by mouth 2 (two) times daily., Disp: 60 tablet, Rfl: 3   clonazePAM  (KLONOPIN ) 0.5 MG tablet, Take 0.5-1 tablets (0.25-0.5 mg total) by mouth as needed., Disp: 30 tablet, Rfl: 0   levothyroxine  (SYNTHROID , LEVOTHROID) 50 MCG tablet, TAKE 1 TABLET (50 MCG TOTAL) BY MOUTH DAILY BEFORE  BREAKFAST., Disp: 30 tablet, Rfl: 5   metFORMIN  (GLUCOPHAGE ) 500 MG tablet, Take 500 mg by mouth 2 (two) times daily with a meal. Dose decreased to 500 mg twice a day per PCP., Disp: , Rfl:    Potassium Chloride  ER 20 MEQ TBCR, Take 1 tablet (20 mEq total) by mouth daily., Disp: 90 tablet, Rfl: 1   Study - LIBREXIA-ACS - milvexian 25 mg or placebo tablet (PI-Stuckey), Take 1 tablet by mouth 2 (two) times daily. Take with or without food. Bring bottle back to every visit. For Investigational Use Only. Please contact [facility] for any questions or concerns regarding this medication., Disp: 210 tablet, Rfl: 0   Are there any labs that are clinically significant?  Yes []  OR No[x]    Reviewed in detail  TS

## 2023-06-24 ENCOUNTER — Encounter (HOSPITAL_COMMUNITY)
Admission: RE | Admit: 2023-06-24 | Discharge: 2023-06-24 | Disposition: A | Discharge status: Home / Self Care | Payer: PPO | Source: Ambulatory Visit | Attending: Cardiovascular Disease | Admitting: Cardiovascular Disease

## 2023-06-24 ENCOUNTER — Encounter (HOSPITAL_COMMUNITY): Payer: PPO

## 2023-06-24 VITALS — Ht 66.0 in | Wt 160.1 lb

## 2023-06-24 DIAGNOSIS — Z955 Presence of coronary angioplasty implant and graft: Secondary | ICD-10-CM

## 2023-06-24 DIAGNOSIS — I214 Non-ST elevation (NSTEMI) myocardial infarction: Secondary | ICD-10-CM

## 2023-06-27 ENCOUNTER — Ambulatory Visit (HOSPITAL_COMMUNITY): Payer: PPO

## 2023-06-27 ENCOUNTER — Encounter (HOSPITAL_COMMUNITY): Payer: PPO

## 2023-06-28 NOTE — Progress Notes (Unsigned)
Cardiology Office Note    Date:  06/29/2023  ID:  Sunita, Demond [birth year], MRN [id number] PCP:  Orpha Bur, MD  Cardiologist:  Nanetta Batty, MD  Electrophysiologist:  None   Chief Complaint: Follow up for CAD  History of Present Illness: .    Suzanne Sutton is a 77 y.o. female with visit-pertinent history of CAD, HTN, hyperlipidema, hypothyroidism, type 2 diabetes.   On 03/21/2023 she presented to the ED with chest pain, high-sensitivity troponin 38, 174.  Echocardiogram on 03/21/2023 showed EF 55 to 60% with no RWMA, G1 DD, normal RV systolic function.  She underwent LHC on 7/9 that showed 40% stenosis in proximal RCA, 99% stenosis in mid RCA, 90% stenosis in ostial to proximal the Cx, 60% stenosis in mid LAD.  She was treated with balloon angioplasty to the RCA, however due to heavy calcification unable to deliver a lithotripsy balloon.  Her LAD did not have flow-limiting disease and the circumflex was not favorable for PCI.  She returned to the Cath Lab on 7/10 for intervention on the RCA, she underwent successful orbital arthrectomy and DES placement to the mid RCA.  Recommended patient be on dual antiplatelet therapy for at least 12 months.  She was discharged on 7/11.  She was last seen in clinic on 03/29/2023 for follow-up.  She noted 1 episode of mild chest pressure with some increased work of breathing.  She drank some Pepsi, after belching her symptoms resolved.  She had otherwise remained stable from a cardiac perspective.  Today she presents for follow-up.  She reports that she is doing very well.  She will be completing cardiac rehab at the end of this week, she reports she has enjoyed cardiac rehab.  She has remained stable from a cardiac standpoint.  She plans to return to work in the next week working at a [employer].   Labwork independently reviewed: 03/29/2023: Sodium 137, potassium 4.1, creatinine 0.83, hemoglobin 16, hematocrit 47.4 06/17/23: total  cholesterol 113, triglycerides 66, HDL 58 and LDL 41, AST 25, ALT 26  ROS: .   Today she denies chest pain, shortness of breath, lower extremity edema, fatigue, palpitations, melena, hematuria, hemoptysis, diaphoresis, weakness, presyncope, syncope, orthopnea, and PND.  All other systems are reviewed and otherwise negative. Studies Reviewed: Marland Kitchen    EKG:  EKG not ordered today.   CV Studies:  Cardiac Studies & Procedures   CARDIAC CATHETERIZATION  CARDIAC CATHETERIZATION 03/23/2023  Narrative   Prox RCA lesion is 40% stenosed.   RPDA lesion is 50% stenosed.   Mid RCA lesion is 90% stenosed.   A drug-eluting stent was successfully placed using a STENT ONYX FRONTIER 3.0X26.   Post intervention, there is a 0% residual stenosis.   In the absence of any other complications or medical issues, we expect the patient to be ready for discharge from an interventional cardiology perspective on 03/24/2023.  Successful orbital atherectomy and drug-eluting stent placement to the mid right coronary artery.  Recommendations: Dual antiplatelet therapy for at least 12 months. Aggressive treatment of risk factors.  Findings Coronary Findings Diagnostic  Dominance: Right  Right Coronary Artery Vessel is large. Prox RCA lesion is 40% stenosed. Mid RCA lesion is 90% stenosed. The lesion is severely calcified. The lesion was previously treated using angioplasty .  Right Posterior Descending Artery RPDA lesion is 50% stenosed.  Intervention  Mid RCA lesion Atherectomy CATH LAUNCHER 6FR AL.75 guide catheter was inserted. WIRE VIPERWIRE COR FLEX .[id number]  guidewire was used to cross lesion. Orbital atherectomy was performed using a CROWN DIAMONDBACK CLASSIC 1.25. 10 passes taken. Multiple passes on low speed were performed. Stent Lesion length:  22 mm. Lesion crossed with guidewire using a WIRE RUNTHROUGH .[id number]. Pre-stent angioplasty was performed using a BALLN SCOREFLEX 2.50X15. Maximum pressure:  12  atm. Inflation time:  30 sec. A drug-eluting stent was successfully placed using a STENT ONYX FRONTIER 3.0X26. Maximum pressure: 14 atm. Inflation time: 20 sec. Post-stent angioplasty was performed using a BALL SAPPHIRE NC24 3.25X15. Maximum pressure:  14 atm. Inflation time:  20 sec. Post-Intervention Lesion Assessment The intervention was successful. Pre-interventional TIMI flow is 3. Post-intervention TIMI flow is 3. No complications occurred at this lesion. There is a 0% residual stenosis post intervention.   CARDIAC CATHETERIZATION 03/22/2023  Narrative   Prox RCA lesion is 40% stenosed.   Mid RCA lesion is 99% stenosed.   Ost Cx to Prox Cx lesion is 90% stenosed.   Mid LAD lesion is 60% stenosed.   Balloon angioplasty was performed using a BALLN EMERGE MR 2.0X12.   Post intervention, there is a 80% residual stenosis.  Large dominant RCA with heavy calcification throughout the proximal, mid and distal vessel. Mild proximal stenosis. Severe, heavily calcified focal mid stenosis. The LAD has moderate mid vessel stenosis (RFR 0.96 suggesting this lesion is not flow limiting) Small non-dominant Circumflex with severe, heavily calcified proximal stenosis. This vessel is not favorable for PCI. Normal LV filling pressure  Recommendations: Her LAD does not have flow limiting disease and the Circumflex is not favorable for PCI (vessel size, ostial stenosis, tortuosity). I attempted to treat her RCA today but due to the heavy calcification, I was unable to deliver a lithotripsy balloon. I did perform balloon angioplasty which should provide improved flow down the RCA while we await the next procedure. She will need to have orbital atherectomy. There is no obvious vessel dissection at the balloon angioplasty site so we may be able to perform atherectomy later this week. I am in the cath lab on Thursday of this week and could do it then if we can get industry support. She has been loaded with  Plavix.  Findings Coronary Findings Diagnostic  Dominance: Right  Left Anterior Descending Vessel is large. Mid LAD lesion is 60% stenosed.  Left Circumflex Vessel is small. Ost Cx to Prox Cx lesion is 90% stenosed. The lesion is calcified.  Right Coronary Artery Vessel is large. Prox RCA lesion is 40% stenosed. Mid RCA lesion is 99% stenosed. The lesion is calcified.  Intervention  Mid RCA lesion Angioplasty CATH LAUNCHER 6FR JR4 guide catheter was inserted. WIRE ASAHI PROWATER 180CM guidewire used to cross lesion. Balloon angioplasty was performed using a BALLN EMERGE MR 2.0X12. Post-Intervention Lesion Assessment The intervention was successful. Pre-interventional TIMI flow is 3. Post-intervention TIMI flow is 3. No complications occurred at this lesion. There is a 80% residual stenosis post intervention.     ECHOCARDIOGRAM  ECHOCARDIOGRAM COMPLETE 03/21/2023  Narrative ECHOCARDIOGRAM REPORT    Patient Name:   Suzanne Sutton Date of Exam: 03/21/2023 Medical Rec #:  [id number]     Height:       66.0 in Accession #:    [id number]    Weight:       172.0 lb Date of Birth:  23-Jul-1946    BSA:          1.876 m Patient Age:    76 years      BP:  166/82 mmHg Patient Gender: F             HR:           67 bpm. Exam Location:  Inpatient  Procedure: 2D Echo, Cardiac Doppler and Color Doppler  Indications:    NSTEMI  History:        Patient has no prior history of Echocardiogram examinations. Risk Factors:Diabetes and Hypertension.  Sonographer:    Melissa Morford RDCS (AE, PE) Referring Phys: [id number] Cyndi Bender  IMPRESSIONS   1. Left ventricular ejection fraction, by estimation, is 55 to 60%. The left ventricle has normal function. The left ventricle has no regional wall motion abnormalities. Left ventricular diastolic parameters are consistent with Grade I diastolic dysfunction (impaired relaxation). 2. Right ventricular systolic function is normal. The right  ventricular size is normal. 3. The mitral valve is normal in structure. No evidence of mitral valve regurgitation. No evidence of mitral stenosis. 4. The aortic valve is normal in structure. Aortic valve regurgitation is not visualized. No aortic stenosis is present. 5. The inferior vena cava is normal in size with greater than 50% respiratory variability, suggesting right atrial pressure of 3 mmHg.  FINDINGS Left Ventricle: Left ventricular ejection fraction, by estimation, is 55 to 60%. The left ventricle has normal function. The left ventricle has no regional wall motion abnormalities. The left ventricular internal cavity size was normal in size. There is no left ventricular hypertrophy. Left ventricular diastolic parameters are consistent with Grade I diastolic dysfunction (impaired relaxation).  Right Ventricle: The right ventricular size is normal. No increase in right ventricular wall thickness. Right ventricular systolic function is normal.  Left Atrium: Left atrial size was normal in size.  Right Atrium: Right atrial size was normal in size.  Pericardium: There is no evidence of pericardial effusion.  Mitral Valve: The mitral valve is normal in structure. Mild to moderate mitral annular calcification. No evidence of mitral valve regurgitation. No evidence of mitral valve stenosis.  Tricuspid Valve: The tricuspid valve is normal in structure. Tricuspid valve regurgitation is not demonstrated. No evidence of tricuspid stenosis.  Aortic Valve: The aortic valve is normal in structure. Aortic valve regurgitation is not visualized. No aortic stenosis is present.  Pulmonic Valve: The pulmonic valve was not well visualized. Pulmonic valve regurgitation is not visualized. No evidence of pulmonic stenosis.  Aorta: The aortic root is normal in size and structure.  Venous: The inferior vena cava is normal in size with greater than 50% respiratory variability, suggesting right atrial pressure  of 3 mmHg.  IAS/Shunts: No atrial level shunt detected by color flow Doppler.   LEFT VENTRICLE PLAX 2D LVIDd:         3.80 cm     Diastology LVIDs:         2.90 cm     LV e' medial:    5.11 cm/s LV PW:         1.00 cm     LV E/e' medial:  10.1 LV IVS:        1.10 cm     LV e' lateral:   4.79 cm/s LVOT diam:     2.10 cm     LV E/e' lateral: 10.8 LV SV:         65 LV SV Index:   35 LVOT Area:     3.46 cm  LV Volumes (MOD) LV vol d, MOD A4C: 44.1 ml LV SV MOD A4C:     44.1 ml  RIGHT  VENTRICLE RV S prime:     9.36 cm/s TAPSE (M-mode): 2.3 cm  LEFT ATRIUM             Index        RIGHT ATRIUM           Index LA diam:        3.30 cm 1.76 cm/m   RA Area:     13.40 cm LA Vol (A2C):   27.8 ml 14.82 ml/m  RA Volume:   31.90 ml  17.01 ml/m LA Vol (A4C):   34.0 ml 18.13 ml/m LA Biplane Vol: 33.5 ml 17.86 ml/m AORTIC VALVE LVOT Vmax:   68.50 cm/s LVOT Vmean:  53.100 cm/s LVOT VTI:    0.189 m  AORTA Ao Root diam: 2.90 cm Ao Asc diam:  3.00 cm  MITRAL VALVE MV Area (PHT): 3.56 cm     SHUNTS MV Decel Time: 213 msec     Systemic VTI:  0.19 m MV E velocity: 51.80 cm/s   Systemic Diam: 2.10 cm MV A velocity: 103.00 cm/s MV E/A ratio:  0.50  Arvilla Meres MD Electronically signed by Arvilla Meres MD Signature Date/Time: 03/21/2023/5:35:42 PM    Final              Current Reported Medications:.    Current Meds  Medication Sig   albuterol (VENTOLIN HFA) 108 (90 Base) MCG/ACT inhaler Inhale 1 puff into the lungs every 4 (four) hours as needed.   aspirin EC 81 MG tablet Take 1 tablet (81 mg total) by mouth daily. Swallow whole.   azelastine (ASTELIN) 137 MCG/SPRAY nasal spray Place 1 spray into the nose daily as needed for allergies (post nasal drip). Use in each nostril as directed   benazepril-hydrochlorthiazide (LOTENSIN HCT) 20-25 MG tablet Take 1 tablet by mouth daily.   Beta Carotene (VITAMIN A) 25000 UNIT capsule Take 25,000 Units by mouth daily.    clonazePAM (KLONOPIN) 0.5 MG tablet Take 0.5-1 tablets (0.25-0.5 mg total) by mouth as needed.   Continuous Glucose Receiver (FREESTYLE LIBRE 2 READER) DEVI Use to monitor blood glucose continouously   Continuous Glucose Sensor (FREESTYLE LIBRE 2 SENSOR) MISC Use as directed   fexofenadine (ALLEGRA) 180 MG tablet Take 180 mg by mouth daily.   glucose blood (FREESTYLE TEST STRIPS) test strip USE AS INSTRUCTED TO CHECK BLOOD SUGAR ONCE DAILY DX CODE E11.9   levothyroxine (SYNTHROID) 50 MCG tablet Take 1 tablet (50 mcg total) by mouth every morning on an empty stomach   metFORMIN (GLUCOPHAGE) 500 MG tablet Take 1 tablet (500 mg total) by mouth 2 (two) times daily with a meal.   Potassium Chloride ER 20 MEQ TBCR Take 1 tablet (20 mEq total) by mouth daily.   Study - LIBREXIA-ACS - milvexian 25 mg or placebo tablet (PI-Stuckey) Take 1 tablet by mouth 2 (two) times daily. Take with or without food. Bring bottle back to every visit. For Investigational Use Only. Please contact [facility] for any questions or concerns regarding this medication.   [DISCONTINUED] atorvastatin (LIPITOR) 40 MG tablet Take 1 tablet (40 mg total) by mouth daily.   [DISCONTINUED] metoprolol succinate (TOPROL-XL) 25 MG 24 hr tablet Take 1 tablet (25 mg total) by mouth daily.   [DISCONTINUED] nitroGLYCERIN (NITROSTAT) 0.4 MG SL tablet Place 1 tablet (0.4 mg total) under the tongue every 5 (five) minutes x 3 doses as needed for chest pain.   [DISCONTINUED] ticagrelor (BRILINTA) 90 MG TABS tablet Take 1 tablet (90 mg  total) by mouth 2 (two) times daily.   Physical Exam:    VS:  BP 124/70 (BP Location: Right Arm, Patient Position: Sitting, Cuff Size: Normal)   Pulse 79   Ht 5\' 6"  (1.676 m)   Wt 157 lb 3.2 oz (71.3 kg)   SpO2 98%   BMI 25.37 kg/m    Wt Readings from Last 3 Encounters:  06/29/23 157 lb 3.2 oz (71.3 kg)  06/24/23 160 lb 0.9 oz (72.6 kg)  04/07/23 163 lb 9.3 oz (74.2 kg)    GEN: Well nourished,  well developed in no acute distress NECK: No JVD; No carotid bruits CARDIAC: RRR, no murmurs, rubs, gallops RESPIRATORY:  Clear to auscultation without rales, wheezing or rhonchi  ABDOMEN: Soft, non-tender, non-distended EXTREMITIES:  No edema; No acute deformity   Asessement and Plan:.    CAD: S/p NSTEMI on 03/21/23. LHC on 7/9 showed 40% stenosis in proximal RCA, 99% stenosis in mid RCA, 90% stenosis in ostial to proximal the Cx, 60% stenosis in mid LAD.  She was treated with balloon angioplasty to the RCA, however due to heavy calcification unable to deliver a lithotripsy balloon.  Her LAD did not have flow-limiting disease and the circumflex was not favorable for PCI.  Returned to the Cath Lab on 7/10, she had successful orbital arthrectomy and DES placement to the mid RCA. Today she denies anginal symptoms, she will be finishing cardiac later this week. Heart healthy diet and regular cardiovascular exercise encouraged.  Continue aspirin, Brilinta, Lipitor 40 mg daily, metoprolol succinate 50 mg daily. Refills sent her home pharmacy. Currently enrolled in [place] ACS study   HLD: Last lipid profile on 06/17/23 indicated total cholesterol 113, triglycerides 66, HDL 58 and LDL 41.  Continue Lipitor 40 mg daily.  HTN: Blood pressure well controlled today at 124/70. Continue antihypertensive regimen as noted above.   Hypothyroidism: Last TSH 2.567.  On Synthroid.  Monitored and managed per PCP.  DM type 2: Last A1c 7.0 on 03/21/2023.  Monitored and managed per PCP.  Disposition: F/u with Dr. Allyson Sabal in six months or sooner if needed.  Signed, Rip Harbour, NP

## 2023-06-29 ENCOUNTER — Ambulatory Visit: Payer: PPO | Attending: Nurse Practitioner | Admitting: Cardiology

## 2023-06-29 ENCOUNTER — Encounter (HOSPITAL_COMMUNITY): Payer: PPO

## 2023-06-29 ENCOUNTER — Other Ambulatory Visit (HOSPITAL_COMMUNITY): Payer: Self-pay

## 2023-06-29 ENCOUNTER — Encounter: Payer: Self-pay | Admitting: Nurse Practitioner

## 2023-06-29 ENCOUNTER — Telehealth: Payer: Self-pay | Admitting: Cardiology

## 2023-06-29 VITALS — BP 124/70 | HR 79 | Ht 66.0 in | Wt 157.2 lb

## 2023-06-29 DIAGNOSIS — E038 Other specified hypothyroidism: Secondary | ICD-10-CM | POA: Diagnosis not present

## 2023-06-29 DIAGNOSIS — I10 Essential (primary) hypertension: Secondary | ICD-10-CM

## 2023-06-29 DIAGNOSIS — I2510 Atherosclerotic heart disease of native coronary artery without angina pectoris: Secondary | ICD-10-CM

## 2023-06-29 DIAGNOSIS — E782 Mixed hyperlipidemia: Secondary | ICD-10-CM | POA: Diagnosis not present

## 2023-06-29 MED ORDER — ATORVASTATIN CALCIUM 40 MG PO TABS
40.0000 mg | ORAL_TABLET | Freq: Every day | ORAL | 3 refills | Status: DC
  Filled 2023-06-29: qty 90, 90d supply, fill #0

## 2023-06-29 MED ORDER — TICAGRELOR 90 MG PO TABS
90.0000 mg | ORAL_TABLET | Freq: Two times a day (BID) | ORAL | 3 refills | Status: DC
  Filled 2023-06-29: qty 180, 90d supply, fill #0

## 2023-06-29 MED ORDER — NITROGLYCERIN 0.4 MG SL SUBL
0.4000 mg | SUBLINGUAL_TABLET | SUBLINGUAL | 3 refills | Status: DC | PRN
  Filled 2023-06-29: qty 25, 1d supply, fill #0

## 2023-06-29 MED ORDER — TICAGRELOR 90 MG PO TABS
90.0000 mg | ORAL_TABLET | Freq: Two times a day (BID) | ORAL | 3 refills | Status: DC
Start: 2023-06-29 — End: 2024-07-16
  Filled 2023-06-29 – 2023-07-13 (×2): qty 180, 90d supply, fill #0
  Filled 2023-10-12: qty 180, 90d supply, fill #1
  Filled 2023-11-09 – 2024-01-06 (×2): qty 180, 90d supply, fill #2
  Filled 2024-04-04: qty 180, 90d supply, fill #3

## 2023-06-29 MED ORDER — METOPROLOL SUCCINATE ER 25 MG PO TB24
25.0000 mg | ORAL_TABLET | Freq: Every day | ORAL | 3 refills | Status: DC
  Filled 2023-06-29 – 2023-07-13 (×2): qty 90, 90d supply, fill #0
  Filled 2023-10-12: qty 90, 90d supply, fill #1
  Filled 2024-01-06: qty 90, 90d supply, fill #2
  Filled 2024-03-28: qty 90, 90d supply, fill #3

## 2023-06-29 MED ORDER — METOPROLOL SUCCINATE ER 25 MG PO TB24
25.0000 mg | ORAL_TABLET | Freq: Every day | ORAL | 3 refills | Status: DC
  Filled 2023-06-29: qty 90, 90d supply, fill #0

## 2023-06-29 MED ORDER — NITROGLYCERIN 0.4 MG SL SUBL
0.4000 mg | SUBLINGUAL_TABLET | SUBLINGUAL | 3 refills | Status: AC | PRN
  Filled 2023-06-29: qty 25, 7d supply, fill #0
  Filled 2024-06-08: qty 25, 7d supply, fill #1

## 2023-06-29 MED ORDER — ATORVASTATIN CALCIUM 40 MG PO TABS
40.0000 mg | ORAL_TABLET | Freq: Every day | ORAL | 3 refills | Status: DC
  Filled 2023-06-29 – 2023-07-13 (×2): qty 90, 90d supply, fill #0
  Filled 2023-10-12: qty 90, 90d supply, fill #1
  Filled 2024-01-06: qty 90, 90d supply, fill #2
  Filled 2024-03-28: qty 90, 90d supply, fill #3

## 2023-06-29 NOTE — Patient Instructions (Addendum)
Medication Instructions:  Your physician recommends that you continue on your current medications as directed. Please refer to the Current Medication list given to you today.    *If you need a refill on your cardiac medications before your next appointment, please call your pharmacy*   Lab Work: NONE ordered at this time of appointment   Testing/Procedures: NONE ordered at this time of appointment   Follow-Up: At [facility], you and your health needs are our priority.  As part of our continuing mission to provide you with exceptional heart care, we have created designated Provider Care Teams.  These Care Teams include your primary Cardiologist (physician) and Advanced Practice Providers (APPs -  Physician Assistants and Nurse Practitioners) who all work together to provide you with the care you need, when you need it.  We recommend signing up for the patient portal called "MyChart".  Sign up information is provided on this After Visit Summary.  MyChart is used to connect with patients for Virtual Visits (Telemedicine).  Patients are able to view lab/test results, encounter notes, upcoming appointments, etc.  Non-urgent messages can be sent to your provider as well.   To learn more about what you can do with MyChart, go to [email or web address].    Your next appointment:   6 month(s)  Provider:   Nanetta Batty, MD     Other Instructions Please call back mid November to schedule 6 month office visit.

## 2023-06-29 NOTE — Telephone Encounter (Signed)
Patient states she is returning a call but she does not know what it was in regards to.

## 2023-06-29 NOTE — Telephone Encounter (Signed)
Advised pt no notes seen as to who may have called her. No questions or concerns.

## 2023-07-01 ENCOUNTER — Encounter (HOSPITAL_COMMUNITY)
Admission: RE | Admit: 2023-07-01 | Discharge: 2023-07-01 | Disposition: A | Discharge status: Home / Self Care | Payer: PPO | Source: Ambulatory Visit | Attending: Cardiovascular Disease | Admitting: Cardiovascular Disease

## 2023-07-01 ENCOUNTER — Ambulatory Visit (HOSPITAL_COMMUNITY): Payer: PPO

## 2023-07-01 DIAGNOSIS — I214 Non-ST elevation (NSTEMI) myocardial infarction: Secondary | ICD-10-CM

## 2023-07-01 DIAGNOSIS — Z955 Presence of coronary angioplasty implant and graft: Secondary | ICD-10-CM | POA: Diagnosis not present

## 2023-07-01 NOTE — Progress Notes (Signed)
Discharge Progress Report  Patient Details  Name: Suzanne Sutton MRN: [id number] Date of Birth: 02/02/1946 Referring Provider:   Flowsheet Row INTENSIVE CARDIAC REHAB ORIENT from 04/07/2023 in [facility]  Referring Provider Nanetta Batty, MD        Number of Visits: 45  Reason for Discharge:  Patient reached a stable level of exercise. Patient independent in their exercise. Patient has met program and personal goals.  Smoking History:  Social History   Tobacco Use  Smoking Status Never  Smokeless Tobacco Never    Diagnosis:  NSTEMI (non-ST elevated myocardial infarction) (HCC)  S/P drug eluting coronary stent placement  ADL [facility]:   Initial Exercise Prescription:  Initial Exercise Prescription - 04/07/23 1500       Date of Initial Exercise RX and Referring Provider   Date 04/07/23    Referring Provider Nanetta Batty, MD    Expected Discharge Date 06/29/23      T5 Nustep   Level 1    SPM 50    Minutes 30    METs 1.5      Prescription Details   Frequency (times per week) 3    Duration Progress to 30 minutes of continuous aerobic without signs/symptoms of physical distress      Intensity   THRR 40-80% of Max Heartrate 57-114    Ratings of Perceived Exertion 11-13    Perceived Dyspnea 0-4      Progression   Progression Continue progressive overload as per policy without signs/symptoms or physical distress.      Resistance Training   Training Prescription Yes    Weight 2    Reps 10-15             Discharge Exercise Prescription (Final Exercise Prescription Changes):  Exercise Prescription Changes - 07/01/23 1624       Response to Exercise   Blood Pressure (Admit) 116/60    Blood Pressure (Exercise) --   over 10 exercise sessions. No Exercise BP needed   Blood Pressure (Exit) 110/60    Heart Rate (Admit) 94 bpm    Heart Rate (Exercise) 102 bpm    Heart Rate (Exit) 83 bpm    Rating  of Perceived Exertion (Exercise) 11    Perceived Dyspnea (Exercise) 0    Symptoms none    Comments Pt graduated the [facility]    Duration Progress to 30 minutes of  aerobic without signs/symptoms of physical distress    Intensity THRR unchanged      Progression   Progression Continue to progress workloads to maintain intensity without signs/symptoms of physical distress.    Average METs 2.6      Resistance Training   Training Prescription Yes    Weight 2 lbs wts    Reps 10-15    Time 10 Minutes      T5 Nustep   Level 2    SPM 120    Minutes 30    METs 2.6      Home Exercise Plan   Plans to continue exercise at Home (comment)    Frequency Add 4 additional days to program exercise sessions.    Initial Home Exercises Provided 04/29/23             Functional Capacity:  6 Minute Walk     Row Name 04/07/23 1517 06/24/23 1700       6 Minute Walk   Phase Initial Discharge    Distance 480  feet 1090 feet    Distance % Change -- 127 %    Distance Feet Change -- 610 ft    Walk Time 6 minutes 6 minutes    # of Rest Breaks 1  2:30-4:00 3/10 lightheadedness, resolved and pt continued. see note in ITP for details 0    MPH 0.9 2.06    METS 1.21 2.16    RPE 13 11    Perceived Dyspnea  1 1    VO2 Peak 4.23 7.55    Symptoms Yes (comment) No    Comments 3/10 lightheadedness. VSS, resolved with seated rest break. RPD = 1, pt has allergy cough and resolved with rest --    Resting HR 96 bpm 91 bpm    Resting BP 108/68 114/76    Resting Oxygen Saturation  100 % --    Exercise Oxygen Saturation  during 6 min walk 98 % --    Max Ex. HR 112 bpm 98 bpm    Max Ex. BP 126/66 124/62    2 Minute Post BP 114/68 --             Psychological, QOL, Others - Outcomes: PHQ 2/9:    07/01/2023    4:08 PM 04/07/2023    3:16 PM  Depression screen PHQ 2/9  Decreased Interest 0 0  Down, Depressed, Hopeless 0 0  PHQ - 2 Score 0 0  Altered sleeping 1 2  Tired, decreased energy 0 2   Change in appetite 0 1  Feeling bad or failure about yourself  0 1  Trouble concentrating 0 0  Moving slowly or fidgety/restless 0 0  Suicidal thoughts 0 0  PHQ-9 Score 1 6  Difficult doing work/chores Not difficult at all --    Quality of Life:  Quality of Life - 06/24/23 1704       Quality of Life   Select Quality of Life      Quality of Life Scores   Health/Function Post 26.14 %    Socioeconomic Post 26 %    Psych/Spiritual Post 24.43 %    Family Post 24 %    GLOBAL Post 25.47 %             Personal Goals: Goals established at orientation with interventions provided to work toward goal.  Personal Goals and Risk Factors at Admission - 04/07/23 1352       Core Components/Risk Factors/Patient Goals on Admission    Weight Management Yes;Weight Loss    Intervention Weight Management: Develop a combined nutrition and exercise program designed to reach desired caloric intake, while maintaining appropriate intake of nutrient and fiber, sodium and fats, and appropriate energy expenditure required for the weight goal.;Weight Management: Provide education and appropriate resources to help participant work on and attain dietary goals.    Expected Outcomes Short Term: Continue to assess and modify interventions until short term weight is achieved;Long Term: Adherence to nutrition and physical activity/exercise program aimed toward attainment of established weight goal;Weight Loss: Understanding of general recommendations for a balanced deficit meal plan, which promotes 1-2 lb weight loss per week and includes a negative energy balance of [phone] kcal/d;Understanding recommendations for meals to include 15-35% energy as protein, 25-35% energy from fat, 35-60% energy from carbohydrates, less than 200mg  of dietary cholesterol, 20-35 gm of total fiber daily;Understanding of distribution of calorie intake throughout the day with the consumption of 4-5 meals/snacks    Diabetes Yes     Intervention Provide education about signs/symptoms and  action to take for hypo/hyperglycemia.;Provide education about proper nutrition, including hydration, and aerobic/resistive exercise prescription along with prescribed medications to achieve blood glucose in normal ranges: Fasting glucose 65-99 mg/dL    Expected Outcomes Short Term: Participant verbalizes understanding of the signs/symptoms and immediate care of hyper/hypoglycemia, proper foot care and importance of medication, aerobic/resistive exercise and nutrition plan for blood glucose control.;Long Term: Attainment of HbA1C < 7%.    Hypertension Yes    Intervention Provide education on lifestyle modifcations including regular physical activity/exercise, weight management, moderate sodium restriction and increased consumption of fresh fruit, vegetables, and low fat dairy, alcohol moderation, and smoking cessation.;Monitor prescription use compliance.    Expected Outcomes Short Term: Continued assessment and intervention until BP is < 140/27mm HG in hypertensive participants. < 130/45mm HG in hypertensive participants with diabetes, heart failure or chronic kidney disease.;Long Term: Maintenance of blood pressure at goal levels.    Lipids Yes    Intervention Provide education and support for participant on nutrition & aerobic/resistive exercise along with prescribed medications to achieve LDL 70mg , HDL >40mg .    Expected Outcomes Short Term: Participant states understanding of desired cholesterol values and is compliant with medications prescribed. Participant is following exercise prescription and nutrition guidelines.;Long Term: Cholesterol controlled with medications as prescribed, with individualized exercise RX and with personalized nutrition plan. Value goals: LDL < 70mg , HDL > 40 mg.    Stress Yes    Intervention Offer individual and/or small group education and counseling on adjustment to heart disease, stress management and health-related  lifestyle change. Teach and support self-help strategies.;Refer participants experiencing significant psychosocial distress to appropriate mental health specialists for further evaluation and treatment. When possible, include family members and significant others in education/counseling sessions.    Expected Outcomes Short Term: Participant demonstrates changes in health-related behavior, relaxation and other stress management skills, ability to obtain effective social support, and compliance with psychotropic medications if prescribed.;Long Term: Emotional wellbeing is indicated by absence of clinically significant psychosocial distress or social isolation.              Personal Goals Discharge:  Goals and Risk Factor Review     Row Name 04/13/23 1650 05/10/23 1504 06/08/23 1523 07/08/23 1418       Core Components/Risk Factors/Patient Goals Review   Personal Goals Review Weight Management/Obesity;Stress;Hypertension;Lipids;Diabetes Weight Management/Obesity;Stress;Hypertension;Lipids;Diabetes Weight Management/Obesity;Stress;Hypertension;Lipids;Diabetes Weight Management/Obesity;Stress;Hypertension;Lipids;Diabetes    Review Meriah started cardiac rehab on 04/13/23 and did fair with exercise for her fitness level. Vital signs. Patient reported some genralized weakness deined shortness of breath. Encouraged to increase hydration. Jamicia is deconditioned. Joliana continues to do fair with exercise for her fitness level at  Vital signs have been stable. Cabrielle continues to do fair with exercise for her fitness level at  Vital signs have been stable. Sonny's has a CGM her metformin was decreased to 500 mg twice a day. Kaho says she is not having any further low CBG's at home. Dazariah has lost 1.5 kg since starting cardiac rehab. Takya did well with  exercise   Vital signs have been stable. Rinka has lost 2.1  kg since starting cardiac rehab. Daiza completed cardiac rehab on 07/01/23    Expected Outcomes Taquana will  continue to participate in cardiac rehab for exercise, nutrition and lifestyle modifications Dimon will continue to participate in cardiac rehab for exercise, nutrition and lifestyle modifications Kenzee will continue to participate in cardiac rehab for exercise, nutrition and lifestyle modifications Kenyell will continue to participate in cardiac rehab for exercise, nutrition  and lifestyle modifications             Exercise Goals and Review:  Exercise Goals     Row Name 04/07/23 1342             Exercise Goals   Increase Physical Activity Yes       Intervention Provide advice, education, support and counseling about physical activity/exercise needs.;Develop an individualized exercise prescription for aerobic and resistive training based on initial evaluation findings, risk stratification, comorbidities and participant's personal goals.       Expected Outcomes Short Term: Attend rehab on a regular basis to increase amount of physical activity.;Long Term: Exercising regularly at least 3-5 days a week.;Long Term: Add in home exercise to make exercise part of routine and to increase amount of physical activity.       Increase Strength and Stamina Yes       Intervention Provide advice, education, support and counseling about physical activity/exercise needs.;Develop an individualized exercise prescription for aerobic and resistive training based on initial evaluation findings, risk stratification, comorbidities and participant's personal goals.       Expected Outcomes Short Term: Increase workloads from initial exercise prescription for resistance, speed, and METs.;Short Term: Perform resistance training exercises routinely during rehab and add in resistance training at home;Long Term: Improve cardiorespiratory fitness, muscular endurance and strength as measured by increased METs and functional capacity ( )       Able to understand and use rate of perceived exertion (RPE) scale Yes        Intervention Provide education and explanation on how to use RPE scale       Expected Outcomes Short Term: Able to use RPE daily in rehab to express subjective intensity level;Long Term:  Able to use RPE to guide intensity level when exercising independently       Knowledge and understanding of Target Heart Rate Range (THRR) Yes       Intervention Provide education and explanation of THRR including how the numbers were predicted and where they are located for reference       Expected Outcomes Short Term: Able to state/look up THRR;Short Term: Able to use daily as guideline for intensity in rehab;Long Term: Able to use THRR to govern intensity when exercising independently       Understanding of Exercise Prescription Yes       Intervention Provide education, explanation, and written materials on patient's individual exercise prescription       Expected Outcomes Short Term: Able to explain program exercise prescription;Long Term: Able to explain home exercise prescription to exercise independently                Exercise Goals Re-Evaluation:  Exercise Goals Re-Evaluation     Row Name 04/13/23 1646 04/29/23 1714 05/18/23 1643 06/08/23 1612 07/01/23 1626     Exercise Goal Re-Evaluation   Exercise Goals Review Increase Physical Activity;Understanding of Exercise Prescription;Increase Strength and Stamina;Knowledge and understanding of Target Heart Rate Range (THRR);Able to understand and use rate of perceived exertion (RPE) scale Increase Physical Activity;Understanding of Exercise Prescription;Increase Strength and Stamina;Knowledge and understanding of Target Heart Rate Range (THRR);Able to understand and use rate of perceived exertion (RPE) scale Increase Physical Activity;Understanding of Exercise Prescription;Increase Strength and Stamina;Knowledge and understanding of Target Heart Rate Range (THRR);Able to understand and use rate of perceived exertion (RPE) scale Increase Physical  Activity;Understanding of Exercise Prescription;Increase Strength and Stamina;Knowledge and understanding of Target Heart Rate Range (THRR);Able to understand and use rate of perceived  exertion (RPE) scale Increase Physical Activity;Understanding of Exercise Prescription;Increase Strength and Stamina;Knowledge and understanding of Target Heart Rate Range (THRR);Able to understand and use rate of perceived exertion (RPE) scale   Comments Pt first day in the [facility] program. Pt tolerated low intensity exercise well with an average MET level of 1.3. She is learning her THRR, RPE and ExRx. She is off to a good start. Reviewed MET's, goals and home ExRx. Pt tolerated exercise well with an average MET level of 1.5. Pt feels good about her goals and is gaining strength and stamina, she is also feeling more confident at work and is able to get back to some of her ADL's like canning and cooking. Pt is walking and plans to add in a home stepper as able for 15-30 mins for 3-4 days a week. Reviewed MET's with pt. Pt tolerated exercise well with an average MET level of 1.6. Pt feels good and is able to do longer shifts at work, which she is enjoying Reviewed MET's and goals. Pt tolerated exercise well with an average MET level of 1.8. Pt is feeling better and is increasing strength and stamina. She is back at work with more hours and is able to take on more home activities like canning and things around the house, which she enjoys.  she is feeling good with her progress. Pt graduated the [employer] today. Pt tolerated exercise well with an average MET level of 2.6. She did very well in the program and increased her MET's and was able to get back to more of her ADL's and shes back at work and enjoys it. 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 She will continue to exercise by walking and using her stationary bike 5-7 days for 30-45 mins per session   Expected Outcomes  Will continue to increase workloads as tolerated without sign or symtpom Will continue to increase workloads as tolerated without sign or symtpom Will continue to increase workloads as tolerated without sign or symtpom Will continue to increase workloads as tolerated without sign or symtpom Pt will exercise on her own and gain strength            Nutrition & Weight - Outcomes:  Pre Biometrics - 04/07/23 1328       Pre Biometrics   Waist Circumference 42 inches    Hip Circumference 41 inches    Waist to Hip Ratio 1.02 %    Triceps Skinfold 27 mm    % Body Fat 41.1 %    Grip Strength 18 kg    Flexibility 0 in   not done due to back issues   Single Leg Stand 8.87 seconds             Post Biometrics - 06/24/23 1702        Post  Biometrics   Height 5\' 6"  (1.676 m)    Weight 72.6 kg    Waist Circumference 38 inches    Hip Circumference 38 inches    Waist to Hip Ratio 1 %    BMI (Calculated) 25.85    Triceps Skinfold 16 mm    % Body Fat 36.9 %    Grip Strength 25 kg    Flexibility --   back problems, no attempt   Single Leg Stand 5.7 seconds   Pt states her legs are more tired today  Nutrition:  Nutrition Therapy & Goals - 06/17/23 1445       Nutrition Therapy   Diet Heart Healthy Diet    Drug/Food Interactions Statins/Certain Fruits      Personal Nutrition Goals   Nutrition Goal Patient to identify strategies for reducing cardiovascular risk by attending the [facility] education and nutrition series weekly.   goal in action.   Personal Goal #2 Patient to improve diet quality by using the plate method as a guide for meal planning to include lean protein/plant protein, fruits, vegetables, whole grains, nonfat dairy as part of a well-balanced diet.   goal in action.   Personal Goal #3 Patient to identify strategies for blood sugar control with goal A1c <7%   goal in action.   Comments Goals in action. Sejla continues to attend the [facility] education and  nutrition series regularly. Jorian reports that she has started making many dietary changes including reduced carbohydrate intake and switching to whole grains and higher fiber food choices. Her son is an excellent support and does much of the grcoery shopping and cooking. She is monitoring her blood sugar regularly and reports fasting/am blood sugars <120. She is down 4.6# since starting with our program and down >20# since January 2024. Sharronda is eager to improve energy level and ultimately return back to work. Patient will benefit from participation in intensive cardiac rehab for nutrition, exercise, and lifestyle modification.      Intervention Plan   Intervention Prescribe, educate and counsel regarding individualized specific dietary modifications aiming towards targeted core components such as weight, hypertension, lipid management, diabetes, heart failure and other comorbidities.;Nutrition handout(s) given to patient.    Expected Outcomes Short Term Goal: Understand basic principles of dietary content, such as calories, fat, sodium, cholesterol and nutrients.;Long Term Goal: Adherence to prescribed nutrition plan.             Nutrition Discharge:  Nutrition Assessments - 07/01/23 1032       Rate Your Plate Scores   Pre Score 49    Post Score 77             Education Questionnaire Score:  Knowledge Questionnaire Score - 06/24/23 1705       Knowledge Questionnaire Score   Post Score 23/24             Goals reviewed with patient; copy given to patient.Pt graduates from  Intensive/Traditional cardiac rehab program on 07/01/23  with completion of  45 exercise and education sessions. Pt maintained good attendance and progressed nicely during their participation in rehab as evidenced by increased MET level. Genna lost 2.1 kg while enrolled in the program.  Medication list reconciled. Repeat  PHQ score- 1 .  Pt has made significant lifestyle changes and should be commended for their  success.  Charlesia achieved their goals during cardiac rehab.   Pt plans to continue exercise at home riding her bike. Jemmah increased her distance on her post exercise walk test by 610 feet. We are proud of Juni's progress!Thayer Headings RN BSN

## 2023-07-06 ENCOUNTER — Other Ambulatory Visit (HOSPITAL_COMMUNITY): Payer: Self-pay

## 2023-07-13 ENCOUNTER — Other Ambulatory Visit: Payer: Self-pay

## 2023-07-13 ENCOUNTER — Other Ambulatory Visit (HOSPITAL_COMMUNITY): Payer: Self-pay

## 2023-08-16 ENCOUNTER — Other Ambulatory Visit (HOSPITAL_BASED_OUTPATIENT_CLINIC_OR_DEPARTMENT_OTHER): Payer: Self-pay

## 2023-08-17 ENCOUNTER — Encounter: Payer: Self-pay | Admitting: Dermatology

## 2023-08-17 ENCOUNTER — Ambulatory Visit: Payer: PPO | Admitting: Dermatology

## 2023-08-17 VITALS — BP 124/64 | HR 67

## 2023-08-17 DIAGNOSIS — D492 Neoplasm of unspecified behavior of bone, soft tissue, and skin: Secondary | ICD-10-CM | POA: Diagnosis not present

## 2023-08-17 DIAGNOSIS — L570 Actinic keratosis: Secondary | ICD-10-CM | POA: Diagnosis not present

## 2023-08-17 DIAGNOSIS — L578 Other skin changes due to chronic exposure to nonionizing radiation: Secondary | ICD-10-CM

## 2023-08-17 DIAGNOSIS — Z1283 Encounter for screening for malignant neoplasm of skin: Secondary | ICD-10-CM

## 2023-08-17 DIAGNOSIS — D489 Neoplasm of uncertain behavior, unspecified: Secondary | ICD-10-CM

## 2023-08-17 DIAGNOSIS — Z808 Family history of malignant neoplasm of other organs or systems: Secondary | ICD-10-CM

## 2023-08-17 DIAGNOSIS — D1801 Hemangioma of skin and subcutaneous tissue: Secondary | ICD-10-CM | POA: Diagnosis not present

## 2023-08-17 DIAGNOSIS — L821 Other seborrheic keratosis: Secondary | ICD-10-CM

## 2023-08-17 DIAGNOSIS — D225 Melanocytic nevi of trunk: Secondary | ICD-10-CM | POA: Diagnosis not present

## 2023-08-17 DIAGNOSIS — W908XXA Exposure to other nonionizing radiation, initial encounter: Secondary | ICD-10-CM | POA: Diagnosis not present

## 2023-08-17 DIAGNOSIS — L814 Other melanin hyperpigmentation: Secondary | ICD-10-CM

## 2023-08-17 DIAGNOSIS — D229 Melanocytic nevi, unspecified: Secondary | ICD-10-CM

## 2023-08-17 NOTE — Patient Instructions (Addendum)
Patient Handout: Wound Care for Skin Biopsy Site  Taking Care of Your Skin Biopsy Site  Proper care of the biopsy site is essential for promoting healing and minimizing scarring. This handout provides instructions on how to care for your biopsy site to ensure optimal recovery.  1. Cleaning the Wound:  Clean the biopsy site daily with gentle soap and water. Gently pat the area dry with a clean, soft towel. Avoid harsh scrubbing or rubbing the area, as this can irritate the skin and delay healing.  2. Applying Aquaphor and Bandage:  After cleaning the wound, apply a thin layer of Aquaphor ointment to the biopsy site. Cover the area with a sterile bandage to protect it from dirt, bacteria, and friction. Change the bandage daily or as needed if it becomes soiled or wet.  3. Continued Care for One Week:  Repeat the cleaning, Aquaphor application, and bandaging process daily for one week following the biopsy procedure. Keeping the wound clean and moist during this initial healing period will help prevent infection and promote optimal healing.  4. Massaging Aquaphor into the Area:  ---After one week, discontinue the use of bandages but continue to apply Aquaphor to the biopsy site. ----Gently massage the Aquaphor into the area using circular motions. ---Massaging the skin helps to promote circulation and prevent the formation of scar tissue.   Additional Tips:  Avoid exposing the biopsy site to direct sunlight during the healing process, as this can cause hyperpigmentation or worsen scarring. If you experience any signs of infection, such as increased redness, swelling, warmth, or drainage from the wound, contact your healthcare provider immediately. Follow any additional instructions provided by your healthcare provider for caring for the biopsy site and managing any discomfort. Conclusion:  Taking proper care of your skin biopsy site is crucial for ensuring optimal healing and  minimizing scarring. By following these instructions for cleaning, applying Aquaphor, and massaging the area, you can promote a smooth and successful recovery. If you have any questions or concerns about caring for your biopsy site, don't hesitate to contact your healthcare provider for guidance.  Skin Education :   I counseled the patient regarding the following: Sun screen (SPF 30 or greater) should be applied during peak UV exposure (between 10am and 2pm) and reapplied after exercise or swimming.  The ABCDEs of melanoma were reviewed with the patient, and the importance of monthly self-examination of moles was emphasized. Should any moles change in shape or color, or itch, bleed or burn, pt will contact our office for evaluation sooner then their interval appointment.  Plan: Sunscreen Recommendations I recommended a broad spectrum sunscreen with a SPF of 30 or higher. I explained that SPF 30 sunscreens block approximately 97 percent of the sun's harmful rays. Sunscreens should be applied at least 15 minutes prior to expected sun exposure and then every 2 hours after that as long as sun exposure continues. If swimming or exercising sunscreen should be reapplied every 45 minutes to an hour after getting wet or sweating. One ounce, or the equivalent of a shot glass full of sunscreen, is adequate to protect the skin not covered by a bathing suit. I also recommended a lip balm with a sunscreen as well. Sun protective clothing can be used in lieu of sunscreen but must be worn the entire time you are exposed to the sun's rays.  Due to recent changes in healthcare laws, you may see results of your pathology and/or laboratory studies on MyChart before the doctors  have had a chance to review them. We understand that in some cases there may be results that are confusing or concerning to you. Please understand that not all results are received at the same time and often the doctors may need to interpret multiple  results in order to provide you with the best plan of care or course of treatment. Therefore, we ask that you please give [place] 2 business days to thoroughly review all your results before contacting the office for clarification. Should we see a critical lab result, you will be contacted sooner.   If You Need Anything After Your Visit  If you have any questions or concerns for your doctor, please call our main line at [phone] and press option 4 to reach your doctor's medical assistant. If no one answers, please leave a voicemail as directed and we will return your call as soon as possible. Messages left after 4 pm will be answered the following business day.   You may also send [place] a message via MyChart. We typically respond to MyChart messages within 1-2 business days.  For prescription refills, please ask your pharmacy to contact our office. Our fax number is [phone].  If you have an urgent issue when the clinic is closed that cannot wait until the next business day, you can page your doctor at the number below.    Please note that while we do our best to be available for urgent issues outside of office hours, we are not available 24/7.   If you have an urgent issue and are unable to reach [place], you may choose to seek medical care at your doctor's office, retail clinic, urgent care center, or emergency room.  If you have a medical emergency, please immediately call 911 or go to the emergency department.  Pager Numbers  - Dr. Gwen Pounds: [phone]  - Dr. Roseanne Reno: [phone]  - Dr. Katrinka Blazing: [phone]   In the event of inclement weather, please call our main line at [phone] for an update on the status of any delays or closures.  Dermatology Medication Tips: Please keep the boxes that topical medications come in in order to help keep track of the instructions about where and how to use these. Pharmacies typically print the medication instructions only on the boxes and not  directly on the medication tubes.   If your medication is too expensive, please contact our office at [phone] option 4 or send [place] a message through MyChart.   We are unable to tell what your co-pay for medications will be in advance as this is different depending on your insurance coverage. However, we may be able to find a substitute medication at lower cost or fill out paperwork to get insurance to cover a needed medication.   If a prior authorization is required to get your medication covered by your insurance company, please allow [place] 1-2 business days to complete this process.  Drug prices often vary depending on where the prescription is filled and some pharmacies may offer cheaper prices.  The website www.goodrx.com contains coupons for medications through different pharmacies. The prices here do not account for what the cost may be with help from insurance (it may be cheaper with your insurance), but the website can give you the price if you did not use any insurance.  - You can print the associated coupon and take it with your prescription to the pharmacy.  - You may also stop by our office during regular business hours and pick  up a GoodRx coupon card.  - If you need your prescription sent electronically to a different pharmacy, notify our office through [facility] or by phone at [phone] option 4.     Si Usted Necesita Algo Despus de Su Visita  Tambin puede enviarnos un mensaje a travs de Clinical cytogeneticist. Por lo general respondemos a los mensajes de MyChart en el transcurso de 1 a 2 das hbiles.  Para renovar recetas, por favor pida a su farmacia que se ponga en contacto con nuestra oficina. Annie Sable de fax es [place] [phone].  Si tiene un asunto urgente cuando la clnica est cerrada y que no puede esperar hasta el siguiente da hbil, puede llamar/localizar a su doctor(a) al nmero que aparece a continuacin.   Por favor, tenga en cuenta que aunque hacemos  todo lo posible para estar disponibles para asuntos urgentes fuera del horario de [place], no estamos disponibles las 24 horas del da, los 7 [address]  [address] de la [place].   Si tiene un problema urgente y no puede comunicarse con nosotros, puede optar por buscar atencin mdica  en el consultorio de su doctor(a), en una clnica privada, en un centro de atencin urgente o en una sala de emergencias.  Si tiene Engineer, drilling, por favor llame inmediatamente al 911 o vaya a la sala de emergencias.  Nmeros de bper  - Dr. Gwen Pounds: [phone]  - Dra. Roseanne Reno: [phone]  - Dr. Katrinka Blazing: [phone]   En caso de inclemencias del tiempo, por favor llame a Lacy Duverney principal al [phone] para una actualizacin sobre el [place] de cualquier retraso o cierre.  Consejos para la medicacin en dermatologa: Por favor, guarde las cajas en las que vienen los medicamentos de uso tpico para ayudarle a seguir las instrucciones sobre dnde y cmo usarlos. Las farmacias generalmente imprimen las instrucciones del medicamento slo en las cajas y no directamente en los tubos del [place].   Si su medicamento es muy caro, por favor, pngase en contacto con Rolm Gala llamando al [phone] y presione la opcin 4 o envenos un mensaje a travs de Clinical cytogeneticist.   No podemos decirle cul ser su copago por los medicamentos por adelantado ya que esto es diferente dependiendo de la cobertura de su seguro. Sin embargo, es posible que podamos encontrar un medicamento sustituto a Audiological scientist un formulario para que el seguro cubra el medicamento que se considera necesario.   Si se requiere una autorizacin previa para que su compaa de seguros [place] su medicamento, por favor permtanos de 1 a 2 das hbiles para completar [address]  Los precios de los medicamentos varan con frecuencia dependiendo del Environmental consultant de dnde se surte la receta y alguna farmacias pueden ofrecer precios ms baratos.  El  sitio web www.goodrx.com tiene cupones para medicamentos de Health and safety inspector. Los precios aqu no tienen en cuenta lo que podra costar con la ayuda del seguro (puede ser ms barato con su seguro), pero el sitio web puede darle el precio si no utiliz Tourist information centre manager.  - Puede imprimir el cupn correspondiente y llevarlo con su receta a la farmacia.  - Tambin puede pasar por nuestra oficina durante el horario de atencin regular y Education officer, museum una tarjeta de cupones de GoodRx.  - Si necesita que su receta se enve electrnicamente a una farmacia diferente, informe a nuestra oficina a travs de MyChart de [place] o por telfono llamando al [phone] y presione la opcin 4.

## 2023-08-17 NOTE — Progress Notes (Signed)
New Patient Visit   Subjective  Suzanne Sutton is a 77 y.o. female who presents for the following: Skin Cancer Screening and Full Body Skin Exam. No hx of skin cancer. Son has hx of BCC.  The patient presents for Total-Body Skin Exam (TBSE) for skin cancer screening and mole check. The patient has spots, moles and lesions to be evaluated, some may be new or changing.   The following portions of the chart were reviewed this encounter and updated as appropriate: medications, allergies, medical history  Review of Systems:  No other skin or systemic complaints except as noted in HPI or Assessment and Plan.  Objective  Well appearing patient in no apparent distress; mood and affect are within normal limits.  A full examination was performed including scalp, head, eyes, ears, nose, lips, neck, chest, axillae, abdomen, back, buttocks, bilateral upper extremities, bilateral lower extremities, hands, feet, fingers, toes, fingernails, and toenails. All findings within normal limits unless otherwise noted below.   Relevant physical exam findings are noted in the Assessment and Plan.  Left Lower Back Brown 7 x 4 mm macule     Right Malar Cheek Crater-like skin colored lesion     nose Gritty scaly papule   Assessment & Plan   SKIN CANCER SCREENING PERFORMED TODAY.  ACTINIC DAMAGE - Chronic condition, secondary to cumulative UV/sun exposure - diffuse scaly erythematous macules with underlying dyspigmentation - Recommend daily broad spectrum sunscreen SPF 30+ to sun-exposed areas, reapply every 2 hours as needed.  - Staying in the shade or wearing long sleeves, sun glasses (UVA+UVB protection) and wide brim hats (4-inch brim around the entire circumference of the hat) are also recommended for sun protection.  - Call for new or changing lesions.  LENTIGINES, SEBORRHEIC KERATOSES, HEMANGIOMAS - Benign normal skin lesions - Benign-appearing - Call for any changes  MELANOCYTIC  NEVI - Tan-brown and/or pink-flesh-colored symmetric macules and papules - Benign appearing on exam today - Observation - Call clinic for new or changing moles - Recommend daily use of broad spectrum spf 30+ sunscreen to sun-exposed areas.    Neoplasm of uncertain behavior (2) Left Lower Back  Skin / nail biopsy Type of biopsy: tangential   Informed consent: discussed and consent obtained   Timeout: patient name, date of birth, surgical site, and procedure verified   Anesthesia: the lesion was anesthetized in a standard fashion   Anesthetic:  1% lidocaine w/ epinephrine 1-100,000 buffered w/ 8.4% NaHCO3 Instrument used: DermaBlade   Hemostasis achieved with: aluminum chloride   Outcome: patient tolerated procedure well   Post-procedure details: sterile dressing applied and wound care instructions given   Dressing type: bandage and petrolatum    Right Malar Cheek  Skin / nail biopsy Type of biopsy: tangential   Informed consent: discussed and consent obtained   Timeout: patient name, date of birth, surgical site, and procedure verified   Anesthesia: the lesion was anesthetized in a standard fashion   Anesthetic:  1% lidocaine w/ epinephrine 1-100,000 buffered w/ 8.4% NaHCO3 Instrument used: DermaBlade   Hemostasis achieved with: aluminum chloride   Outcome: patient tolerated procedure well   Post-procedure details: sterile dressing applied and wound care instructions given   Dressing type: bandage and petrolatum    Actinic keratosis x 3 nose  Destruction of lesion - nose Complexity: simple   Destruction method: cryotherapy   Informed consent: discussed and consent obtained   Timeout:  patient name, date of birth, surgical site, and procedure verified Lesion destroyed  using liquid nitrogen: Yes   Region frozen until ice ball extended beyond lesion: Yes   Cryotherapy cycles:  2 Outcome: patient tolerated procedure well with no complications   Post-procedure details:  wound care instructions given      Return in about 3 months (around 11/15/2023) for AK f/u.  Dominga Ferry, Surg Tech III, am acting as scribe for Gwenith Daily, MD.   Documentation: I have reviewed the above documentation for accuracy and completeness, and I agree with the above.  Gwenith Daily, MD

## 2023-08-19 LAB — SURGICAL PATHOLOGY

## 2023-08-31 ENCOUNTER — Other Ambulatory Visit (HOSPITAL_COMMUNITY): Payer: Self-pay

## 2023-08-31 MED ORDER — LEVOTHYROXINE SODIUM 50 MCG PO TABS
50.0000 ug | ORAL_TABLET | Freq: Every morning | ORAL | 3 refills | Status: DC
  Filled 2023-08-31: qty 90, 90d supply, fill #0
  Filled 2023-11-26: qty 90, 90d supply, fill #1
  Filled 2024-02-22: qty 90, 90d supply, fill #2
  Filled 2024-05-22: qty 90, 90d supply, fill #3

## 2023-09-02 ENCOUNTER — Other Ambulatory Visit (HOSPITAL_COMMUNITY): Payer: Self-pay

## 2023-09-16 ENCOUNTER — Other Ambulatory Visit (HOSPITAL_COMMUNITY): Payer: Self-pay

## 2023-09-16 MED ORDER — CLONAZEPAM 0.5 MG PO TABS
0.2500 mg | ORAL_TABLET | ORAL | 0 refills | Status: AC | PRN
  Filled 2023-09-16: qty 30, 60d supply, fill #0

## 2023-09-21 ENCOUNTER — Other Ambulatory Visit (HOSPITAL_COMMUNITY): Payer: Self-pay | Admitting: Neurology

## 2023-09-21 VITALS — BP 140/49 | HR 69

## 2023-09-21 DIAGNOSIS — Z006 Encounter for examination for normal comparison and control in clinical research program: Secondary | ICD-10-CM

## 2023-09-21 MED ORDER — STUDY - LIBREXIA-ACS - MILVEXIAN 25 MG OR PLACEBO TABLET (PI-STUCKEY): 1.0000 | ORAL_TABLET | Freq: Two times a day (BID) | ORAL | 0 refills | Status: DC

## 2023-09-21 NOTE — Research (Addendum)
 LIBREXIA ACS 26 WEEK VISIT     Central [facility] Hematology/Chemistry drawn: [x] Yes [] No   PK/Biomarkers drawn: [x] Yes [] No   EQ-5D-5L Assessment Completed? [x] Yes [] No Reason not done: [] Subject Forgot [] Subject too ill [] Subject refused [] Technical failure [] Other If Other, Specify Collection Date: 21-Sep-2023   Medication Kit Accountability Dispensed Status [x] Dispensed [] Dispensed In Error [] Not Dispensed If 'Not Dispensed', provide reason: [] Medical Decision [] Medication Kit not available or damaged [] Study medication discontinued [] Visit skipped or administration skipped [] Medication kit dispensed in error Date Dispensed: 8/Jan/2025 Amount Dispensed: [phone]  Return Status: [] Damaged/Returned by subject [] Missing/Not returned by subject [x] Returned by subject If not returned reasons: [] Forgot [] Lost Date Returned: 08/Jan/2025 Amount Returned:  #[id number] tablets  #[id number] - full bottle  (seal not broken) Subject returned empty bottles [phone] and #[phone].   Were any suspected endpoint events or adverse events experienced? [] Yes [x] No    Are there any labs that are clinically significant?  Yes []  OR No[x] 

## 2023-09-23 NOTE — Research (Addendum)
Are there any labs that are clinically significant?  Yes [] OR No[x]  Is the patient eligible to continue enrollment in the study after screening visit?  Yes [x]  OR No[]          

## 2023-10-07 ENCOUNTER — Other Ambulatory Visit (HOSPITAL_COMMUNITY): Payer: Self-pay

## 2023-10-07 MED ORDER — CEFUROXIME AXETIL 250 MG PO TABS
250.0000 mg | ORAL_TABLET | Freq: Two times a day (BID) | ORAL | 0 refills | Status: DC
  Filled 2023-10-07: qty 14, 7d supply, fill #0

## 2023-10-12 ENCOUNTER — Other Ambulatory Visit (HOSPITAL_COMMUNITY): Payer: Self-pay

## 2023-10-19 ENCOUNTER — Telehealth: Payer: Self-pay | Admitting: Dermatology

## 2023-10-19 NOTE — Telephone Encounter (Signed)
-----   Message from [facility] PACI sent at 10/19/2023  3:26 PM EST ----- 1. DN moderate- left lower back- OK to monitor 2. AK- right malar cheek- cryo with me

## 2023-10-19 NOTE — Telephone Encounter (Signed)
 Called and lvm for pt to call us  back to go over results

## 2023-10-20 ENCOUNTER — Telehealth: Payer: Self-pay | Admitting: Dermatology

## 2023-10-20 NOTE — Telephone Encounter (Signed)
 Called and spoke to the pt and went over result.  She has an appointment already scheduled for 3/5 where we can freeze the AK

## 2023-10-20 NOTE — Telephone Encounter (Signed)
-----   Message from [facility] PACI sent at 10/19/2023  3:26 PM EST ----- 1. DN moderate- left lower back- OK to monitor 2. AK- right malar cheek- cryo with me

## 2023-10-21 ENCOUNTER — Other Ambulatory Visit: Payer: Self-pay

## 2023-10-21 DIAGNOSIS — R300 Dysuria: Secondary | ICD-10-CM | POA: Diagnosis not present

## 2023-10-24 ENCOUNTER — Other Ambulatory Visit (HOSPITAL_COMMUNITY): Payer: Self-pay

## 2023-10-24 MED ORDER — NITROFURANTOIN MONOHYD MACRO 100 MG PO CAPS
100.0000 mg | ORAL_CAPSULE | Freq: Two times a day (BID) | ORAL | 0 refills | Status: DC
Start: 2023-10-24 — End: 2024-01-04
  Filled 2023-10-24: qty 10, 5d supply, fill #0

## 2023-10-25 ENCOUNTER — Other Ambulatory Visit (HOSPITAL_COMMUNITY): Payer: Self-pay

## 2023-10-27 ENCOUNTER — Other Ambulatory Visit (HOSPITAL_COMMUNITY): Payer: Self-pay

## 2023-10-27 ENCOUNTER — Encounter (HOSPITAL_COMMUNITY): Payer: Self-pay

## 2023-10-27 MED ORDER — FREESTYLE LIBRE 3 READER DEVI
0 refills | Status: DC
  Filled 2023-10-27: qty 1, 30d supply, fill #0

## 2023-10-27 MED ORDER — FREESTYLE LIBRE 3 SENSOR MISC
5 refills | Status: DC
Start: 2023-10-27 — End: 2024-04-05
  Filled 2023-10-27: qty 2, 28d supply, fill #0
  Filled 2023-11-30: qty 2, 28d supply, fill #1
  Filled 2023-12-29: qty 2, 28d supply, fill #2
  Filled 2024-01-25: qty 2, 28d supply, fill #3
  Filled 2024-02-22: qty 2, 28d supply, fill #4
  Filled 2024-03-23: qty 2, 28d supply, fill #5

## 2023-11-09 ENCOUNTER — Other Ambulatory Visit (HOSPITAL_COMMUNITY): Payer: Self-pay

## 2023-11-10 ENCOUNTER — Other Ambulatory Visit (HOSPITAL_COMMUNITY): Payer: Self-pay

## 2023-11-10 ENCOUNTER — Other Ambulatory Visit: Payer: Self-pay

## 2023-11-16 ENCOUNTER — Ambulatory Visit: Payer: PPO | Admitting: Dermatology

## 2023-11-16 ENCOUNTER — Encounter: Payer: Self-pay | Admitting: Dermatology

## 2023-11-16 VITALS — BP 118/62 | HR 79

## 2023-11-16 DIAGNOSIS — D492 Neoplasm of unspecified behavior of bone, soft tissue, and skin: Secondary | ICD-10-CM

## 2023-11-16 DIAGNOSIS — L570 Actinic keratosis: Secondary | ICD-10-CM | POA: Diagnosis not present

## 2023-11-16 NOTE — Progress Notes (Signed)
   Follow-Up Visit   Subjective  Suzanne Sutton is a 78 y.o. female who presents for the following: AK Pt is here today to treat AK on right malar cheek, previously biopsied.   She also has a lesion of concern on her left posterior leg, present for years, brown, not changing or painful.  The following portions of the chart were reviewed this encounter and updated as appropriate: medications, allergies, medical history  Review of Systems:  No other skin or systemic complaints except as noted in HPI or Assessment and Plan.  Objective  Well appearing patient in no apparent distress; mood and affect are within normal limits.  A focused examination was performed of the following areas: Right malar cheek Left lower leg Relevant exam findings are noted in the Assessment and Plan.    Assessment & Plan   AK (ACTINIC KERATOSIS) right malar cheek Destruction of lesion - right malar cheek Complexity: simple   Destruction method: cryotherapy   Informed consent: discussed and consent obtained   Timeout:  patient name, date of birth, surgical site, and procedure verified Lesion destroyed using liquid nitrogen: Yes   Outcome: patient tolerated procedure well with no complications   Post-procedure details: wound care instructions given    Neoplasm of skin- LENTIGO vs other- left posterior leg Exam: scattered tan macules Due to sun exposure Treatment Plan: Benign-appearing, observe. Recommend daily broad spectrum sunscreen SPF 30+ to sun-exposed areas, reapply every 2 hours as needed.  Call for any changes   Return in about 6 months (around 05/18/2024) for face and hand check.  I, Tillie Fantasia, CMA, am acting as scribe for Gwenith Daily, MD.   Documentation: I have reviewed the above documentation for accuracy and completeness, and I agree with the above.  Gwenith Daily, MD

## 2023-11-16 NOTE — Patient Instructions (Signed)

## 2023-11-26 ENCOUNTER — Other Ambulatory Visit (HOSPITAL_COMMUNITY): Payer: Self-pay

## 2023-11-28 ENCOUNTER — Other Ambulatory Visit: Payer: Self-pay

## 2023-11-28 ENCOUNTER — Other Ambulatory Visit (HOSPITAL_COMMUNITY): Payer: Self-pay

## 2023-11-28 MED ORDER — BENAZEPRIL-HYDROCHLOROTHIAZIDE 20-25 MG PO TABS
1.0000 | ORAL_TABLET | Freq: Every day | ORAL | 3 refills | Status: AC
  Filled 2023-11-28: qty 90, 90d supply, fill #0
  Filled 2024-02-22: qty 90, 90d supply, fill #1
  Filled 2024-05-22: qty 90, 90d supply, fill #2

## 2023-11-28 MED ORDER — METFORMIN HCL 500 MG PO TABS
500.0000 mg | ORAL_TABLET | Freq: Two times a day (BID) | ORAL | 3 refills | Status: AC
  Filled 2023-11-28: qty 180, 90d supply, fill #0
  Filled 2024-02-22: qty 180, 90d supply, fill #1
  Filled 2024-05-22: qty 180, 90d supply, fill #2
  Filled 2024-10-16: qty 180, 90d supply, fill #3

## 2023-11-28 MED ORDER — POTASSIUM CHLORIDE ER 20 MEQ PO TBCR
1.0000 | EXTENDED_RELEASE_TABLET | Freq: Every day | ORAL | 3 refills | Status: AC
  Filled 2023-11-28: qty 90, 90d supply, fill #0
  Filled 2024-02-22: qty 90, 90d supply, fill #1
  Filled 2024-05-22: qty 90, 90d supply, fill #2
  Filled 2024-08-17: qty 90, 90d supply, fill #3

## 2023-12-03 ENCOUNTER — Telehealth: Payer: Self-pay | Admitting: Physician Assistant

## 2023-12-03 NOTE — Telephone Encounter (Signed)
 Suzanne Sutton is a 78 y.o. female with a hx of CAD called in for symptoms of L leg edema since last Wednesday. She notes pain and swelling in her knee with edema down to her ankle. Edema improved somewhat with elevation. She has not had chest pain, shortness of breath. She had some beef the other day before this started. She has no hx of gout, DVT. She has not had any recent admissions to the hospital or surgeries. She has not had an injury to her leg or recent prolonged immobility.  PLAN: -Symptoms sound like possible DJD flare or pseudogout. -Advised her to contact her PCP Monday for eval. -No risk factors for DVT. I advised her to go to the ED if pain worsens, or she develops redness, tenderness to palpation. Tereso Newcomer, PA-C    12/03/2023 3:25 PM

## 2023-12-05 DIAGNOSIS — Z6824 Body mass index (BMI) 24.0-24.9, adult: Secondary | ICD-10-CM | POA: Diagnosis not present

## 2023-12-05 DIAGNOSIS — M25562 Pain in left knee: Secondary | ICD-10-CM | POA: Diagnosis not present

## 2023-12-14 VITALS — BP 133/53 | HR 80

## 2023-12-14 DIAGNOSIS — Z006 Encounter for examination for normal comparison and control in clinical research program: Secondary | ICD-10-CM

## 2023-12-14 MED ORDER — STUDY - LIBREXIA-ACS - MILVEXIAN 25 MG OR PLACEBO TABLET (PI-STUCKEY): 1.0000 | ORAL_TABLET | Freq: Two times a day (BID) | ORAL | 0 refills | Status: DC

## 2023-12-14 NOTE — Research (Cosign Needed Addendum)
 LIBREXIA ACS 39 WEEK VISIT    Medication Kit Accountability Dispensed Status [x] Dispensed [] Dispensed In Error [] Not Dispensed If 'Not Dispensed', provide reason: [] Medical Decision [] Medication Kit not available or damaged [] Study medication discontinued [] Visit skipped or administration skipped [] Medication kit dispensed in error Date Dispensed: 02/Apr/2025 Amount Dispensed:  Medication Number: [phone]   Medication Number: [id number]   Medication Number: [id number]   Medication Number: [id number]   Return Status: [] Damaged/Returned by subject [] Missing/Not returned by subject [x] Returned by subject If not returned reasons: [] Forgot [] Lost Date Returned:02/Apr/2025 Amount Returned:   [phone] 70 tablets [phone] tablets [phone] tablets   Compliance%: 93  Subject re consented with Librexia ICF version 6.0 and copy given to subject.    Were any suspected endpoint events or adverse events experienced? [x] Yes [] No Was on antibiotics in February for a bladder infection. Subject states she has gotten frequent bladder infections since childhood. Symptoms have resolved. Was recently seen by cardiology for concern for blood clot in left leg. Subject had pain and swelling in left knee, left foot and elbow that made ambulation difficult. Symptoms resolved without medications. She is able to ambulate normally and has a follow up appt with PCP.     Current Outpatient Medications:    albuterol  (VENTOLIN  HFA) 108 (90 Base) MCG/ACT inhaler, Inhale 1 puff into the lungs every 4 (four) hours as needed., Disp: 6.7 g, Rfl: 1   aspirin  EC 81 MG tablet, Take 1 tablet (81 mg total) by mouth daily. Swallow whole., Disp: 120 tablet, Rfl: 3   atorvastatin  (LIPITOR) 40 MG tablet, Take 1 tablet (40 mg total) by mouth daily., Disp: 90 tablet, Rfl: 3   azelastine (ASTELIN) 137 MCG/SPRAY nasal spray, Place 1 spray into the nose daily as needed for allergies (post nasal drip). Use in  each nostril as directed, Disp: , Rfl:    benazepril -hydrochlorthiazide (LOTENSIN  HCT) 20-25 MG tablet, Take 1 tablet by mouth daily., Disp: 90 tablet, Rfl: 3   Beta Carotene (VITAMIN A) 25000 UNIT capsule, Take 25,000 Units by mouth daily., Disp: , Rfl:    clonazePAM  (KLONOPIN ) 0.5 MG tablet, Take 1/2-1 tablet (0.25-0.5 mg total) by mouth as needed., Disp: 30 tablet, Rfl: 0   Continuous Glucose Receiver (FREESTYLE LIBRE 2 READER) DEVI, Use to monitor blood glucose continouously, Disp: 2 each, Rfl: 5   Continuous Glucose Receiver (FREESTYLE LIBRE 3 READER) DEVI, Use as directed., Disp: 1 each, Rfl: 0   Continuous Glucose Sensor (FREESTYLE LIBRE 2 SENSOR) MISC, Use as directed, Disp: 2 each, Rfl: 5   Continuous Glucose Sensor (FREESTYLE LIBRE 3 SENSOR) MISC, Change sensor every 14 days as directed., Disp: 2 each, Rfl: 5   fexofenadine (ALLEGRA) 180 MG tablet, Take 180 mg by mouth daily., Disp: , Rfl:    glucose blood (FREESTYLE TEST STRIPS) test strip, USE AS INSTRUCTED TO CHECK BLOOD SUGAR ONCE DAILY DX CODE E11.9, Disp: 50 each, Rfl: 0   levothyroxine  (SYNTHROID ) 50 MCG tablet, Take 1 tablet (50 mcg total) by mouth every morning on an empty stomach, Disp: 90 tablet, Rfl: 3   metFORMIN  (GLUCOPHAGE ) 500 MG tablet, Take 1 tablet (500 mg total) by mouth 2 (two) times daily with a meal., Disp: 180 tablet, Rfl: 3   metoprolol  succinate (TOPROL -XL) 25 MG 24 hr tablet, Take 1 tablet (25 mg total) by mouth daily., Disp: 90 tablet, Rfl: 3   nitroGLYCERIN  (NITROSTAT ) 0.4 MG SL tablet, Place 1 tablet (0.4 mg total) under the tongue every 5 (five) minutes  x 3 doses as needed for chest pain., Disp: 25 tablet, Rfl: 3   Potassium Chloride  ER 20 MEQ TBCR, Take 1 tablet (20 mEq total) by mouth daily., Disp: 90 tablet, Rfl: 3   PROAIR  HFA 108 (90 BASE) MCG/ACT inhaler, , Disp: , Rfl: 1   Study - LIBREXIA-ACS - milvexian 25 mg or placebo tablet (PI-Stuckey), Take 1 tablet by mouth 2 (two) times daily. Take with or  without food. Bring bottle back to every visit. For Investigational Use Only. Please contact [facility] for any questions or concerns regarding this medication., Disp: 210 tablet, Rfl: 0   Study - LIBREXIA-ACS - milvexian 25 mg or placebo tablet (PI-Stuckey), Take 1 tablet by mouth 2 (two) times daily. For Investigational Use Only. Take with or without food. Bring bottle back to every visit., Disp: 280 tablet, Rfl: 0   ticagrelor  (BRILINTA ) 90 MG TABS tablet, Take 1 tablet (90 mg total) by mouth 2 (two) times daily., Disp: 180 tablet, Rfl: 3   cefUROXime  (CEFTIN ) 250 MG tablet, Take 1 tablet (250 mg total) by mouth every 12 (twelve) hours for 7 days. (Patient not taking: Reported on 12/14/2023), Disp: 14 tablet, Rfl: 0   nitrofurantoin , macrocrystal-monohydrate, (MACROBID ) 100 MG capsule, Take 1 capsule (100 mg total) by mouth every 12 (twelve) hours for 5 days with food. (Patient not taking: Reported on 12/14/2023), Disp: 10 capsule, Rfl: 0  Taking vitamin d3 daily Magnesium glycinate 450 mg daily

## 2023-12-29 ENCOUNTER — Other Ambulatory Visit (HOSPITAL_COMMUNITY): Payer: Self-pay

## 2024-01-04 ENCOUNTER — Ambulatory Visit: Payer: PPO | Attending: Cardiovascular Disease | Admitting: Cardiovascular Disease

## 2024-01-04 ENCOUNTER — Encounter: Payer: Self-pay | Admitting: Cardiovascular Disease

## 2024-01-04 VITALS — BP 122/50 | HR 76 | Ht 66.0 in | Wt 145.8 lb

## 2024-01-04 DIAGNOSIS — I10 Essential (primary) hypertension: Secondary | ICD-10-CM

## 2024-01-04 DIAGNOSIS — E782 Mixed hyperlipidemia: Secondary | ICD-10-CM

## 2024-01-04 DIAGNOSIS — I214 Non-ST elevation (NSTEMI) myocardial infarction: Secondary | ICD-10-CM

## 2024-01-04 NOTE — Patient Instructions (Signed)
 Medication Instructions:  Your physician recommends that you continue on your current medications as directed. Please refer to the Current Medication list given to you today.  *If you need a refill on your cardiac medications before your next appointment, please call your pharmacy*   Follow-Up: At [facility], you and your health needs are our priority.  As part of our continuing mission to provide you with exceptional heart care, our providers are all part of one team.  This team includes your primary Cardiologist (physician) and Advanced Practice Providers or APPs (Physician Assistants and Nurse Practitioners) who all work together to provide you with the care you need, when you need it.  Your next appointment:   12 month(s)  Provider:   Nanetta Batty, MD     We recommend signing up for the patient portal called "MyChart".  Sign up information is provided on this After Visit Summary.  MyChart is used to connect with patients for Virtual Visits (Telemedicine).  Patients are able to view lab/test results, encounter notes, upcoming appointments, etc.  Non-urgent messages can be sent to your provider as well.   To learn more about what you can do with MyChart, go to [email or web address].   Other Instructions       1st Floor: - Lobby - Registration  - Pharmacy  - Lab - Cafe  2nd Floor: - PV Lab - Diagnostic Testing (echo, CT, nuclear med)  3rd Floor: - Vacant  4th Floor: - TCTS (cardiothoracic surgery) - AFib Clinic - Structural Heart Clinic - Vascular Surgery  - Vascular Ultrasound  5th Floor: - [facility] (general and EP) - Clinical Pharmacy for coumadin, hypertension, lipid, weight-loss medications, and med management appointments    Valet parking services will be available as well.

## 2024-01-04 NOTE — Assessment & Plan Note (Signed)
 History of non-STEMI 03/21/2023.  She underwent cardiac catheterization by Dr. Abel Hoe the following day revealing a high-grade calcified distal LAD stenosis, high-grade calcified ostial nondominant circumflex stenosis and 60% proximal LAD stenosis.  The following day she underwent orbital atherectomy, PCI drug-eluting stenting by Dr. Alvenia Aus of the RCA with an excellent result.  She had normal LV function.  She is on aspirin  and Brilinta .

## 2024-01-04 NOTE — Progress Notes (Signed)
 01/04/2024 Suzanne Sutton   [birth year]  [id number]  Primary Physician Errol Heaps, MD Primary Cardiologist: Avanell Leigh MD Lathan Poke, [place], [place]  HPI:  Suzanne Sutton is a 78 y.o. 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  I met her when she was in the hospital in July for a non-STEMI.  Risk factors include treated hypertension, diabetes and hyperlipidemia.  She has a strong family history of heart disease with both parents and a brother all who have had ischemic heart disease at a young age.  She presented on 03/21/2023 with chest pain and positive enzymes.  The following day she had cardiac catheterization by Dr. Abel Hoe revealing 80% calcified mid dominant RCA stenosis, 60% proximal LAD stenosis and 90% proximal nondominant circumflex stenosis.  She had unsuccessful attempt at intervention of her RCA using shockwave angioplasty and the following day she underwent successful orbital atherectomy, PCI and drug-eluting stenting by Dr. Alvenia Aus .  She was discharged home 2 days later.  She has been stable since and denies chest pain or shortness of breath.   Current Meds  Medication Sig   aspirin  EC 81 MG tablet Take 1 tablet (81 mg total) by mouth daily. Swallow whole.   atorvastatin  (LIPITOR) 40 MG tablet Take 1 tablet (40 mg total) by mouth daily.   azelastine (ASTELIN) 137 MCG/SPRAY nasal spray Place 1 spray into the nose daily as needed for allergies (post nasal drip). Use in each nostril as directed   benazepril -hydrochlorthiazide (LOTENSIN  HCT) 20-25 MG tablet Take 1 tablet by mouth daily.   Beta Carotene (VITAMIN A) 25000 UNIT capsule Take 25,000 Units by mouth daily.   clonazePAM  (KLONOPIN ) 0.5 MG tablet Take 1/2-1 tablet (0.25-0.5 mg total) by mouth as needed.   Continuous Glucose Sensor (FREESTYLE LIBRE 3 SENSOR) MISC Change sensor every 14 days as directed.   fexofenadine (ALLEGRA) 180 MG tablet Take  180 mg by mouth daily.   glucose blood (FREESTYLE TEST STRIPS) test strip USE AS INSTRUCTED TO CHECK BLOOD SUGAR ONCE DAILY DX CODE E11.9   levothyroxine  (SYNTHROID ) 50 MCG tablet Take 1 tablet (50 mcg total) by mouth every morning on an empty stomach   metFORMIN  (GLUCOPHAGE ) 500 MG tablet Take 1 tablet (500 mg total) by mouth 2 (two) times daily with a meal.   metoprolol  succinate (TOPROL -XL) 25 MG 24 hr tablet Take 1 tablet (25 mg total) by mouth daily.   nitroGLYCERIN  (NITROSTAT ) 0.4 MG SL tablet Place 1 tablet (0.4 mg total) under the tongue every 5 (five) minutes x 3 doses as needed for chest pain.   Potassium Chloride  ER 20 MEQ TBCR Take 1 tablet (20 mEq total) by mouth daily.   PROAIR  HFA 108 (90 BASE) MCG/ACT inhaler    Study - LIBREXIA-ACS - milvexian 25 mg or placebo tablet (PI-Stuckey) Take 1 tablet by mouth 2 (two) times daily. Take with or without food. Bring bottle back to every visit. For Investigational Use Only. Please contact [facility] for any questions or concerns regarding this medication.   Study - LIBREXIA-ACS - milvexian 25 mg or placebo tablet (PI-Stuckey) Take 1 tablet by mouth 2 (two) times daily. For Investigational Use Only. Take with or without food. Bring bottle back to every visit.   ticagrelor  (BRILINTA ) 90 MG TABS tablet Take 1 tablet (90 mg total) by mouth 2 (two) times daily.   [DISCONTINUED] Continuous Glucose Receiver (FREESTYLE [place]  3 READER) DEVI Use as directed.     Allergies  Allergen Reactions   Cozaar [Losartan Potassium] Swelling    Had knee swelling   Crestor [Rosuvastatin] Swelling    Leg swelling    Social History   Socioeconomic History   Marital status: Widowed    Spouse name: Not on file   Number of children: Not on file   Years of education: Not on file   Highest education level: Not on file  Occupational History   Not on file  Tobacco Use   Smoking status: Never   Smokeless tobacco: Never  Substance and Sexual  Activity   Alcohol use: Not on file   Drug use: Not on file   Sexual activity: Not on file  Other Topics Concern   Not on file  Social History Narrative   Not on file   Social Drivers of Health   Financial Resource Strain: Not on file  Food Insecurity: No Food Insecurity (03/21/2023)   Hunger Vital Sign    Worried About Running Out of Food in the Last Year: Never true    Ran Out of Food in the Last Year: Never true  Transportation Needs: No Transportation Needs (03/21/2023)   PRAPARE - Administrator, Civil Service (Medical): No    Lack of Transportation (Non-Medical): No  Physical Activity: Not on file  Stress: Not on file  Social Connections: Not on file  Intimate Partner Violence: Not At Risk (03/21/2023)   Humiliation, Afraid, Rape, and Kick questionnaire    Fear of Current or Ex-Partner: No    Emotionally Abused: No    Physically Abused: No    Sexually Abused: No     Review of Systems: General: negative for chills, fever, night sweats or weight changes.  Cardiovascular: negative for chest pain, dyspnea on exertion, edema, orthopnea, palpitations, paroxysmal nocturnal dyspnea or shortness of breath Dermatological: negative for rash Respiratory: negative for cough or wheezing Urologic: negative for hematuria Abdominal: negative for nausea, vomiting, diarrhea, bright red blood per rectum, melena, or hematemesis Neurologic: negative for visual changes, syncope, or dizziness All other systems reviewed and are otherwise negative except as noted above.    Blood pressure (!) 122/50, pulse 76, height 5\' 6"  (1.676 m), weight 145 lb 12.8 oz (66.1 kg), SpO2 97%.  General appearance: alert and no distress Neck: no adenopathy, no carotid bruit, no JVD, supple, symmetrical, trachea midline, and thyroid  not enlarged, symmetric, no tenderness/mass/nodules Lungs: clear to auscultation bilaterally Heart: regular rate and rhythm, S1, S2 normal, no murmur, click, rub or  gallop Extremities: extremities normal, atraumatic, no cyanosis or edema Pulses: 2+ and symmetric Skin: Skin color, texture, turgor normal. No rashes or lesions Neurologic: Grossly normal  EKG EKG Interpretation Date/Time:  Wednesday January 04 2024 13:34:32 EDT Ventricular Rate:  76 PR Interval:  164 QRS Duration:  88 QT Interval:  418 QTC Calculation: 470 R Axis:   3  Text Interpretation: Normal sinus rhythm Minimal voltage criteria for LVH, may be normal variant ( R in aVL ) Septal infarct , age undetermined When compared with ECG of 29-Mar-2023 10:41, QRS axis Shifted right T wave inversion no longer evident in Lateral leads Confirmed by Lauro Portal [phone]) on 01/04/2024 1:44:17 PM    ASSESSMENT AND PLAN:   Essential hypertension, benign History of essential hypertension with blood pressure measured today at 122/50.  She is on Lotensin /hydrochlorothiazide  and metoprolol .  Hyperlipidemia History of hyperlipidemia on statin therapy with lipid profile performed 10//24  revealing total cholesterol 113, LDL 41 and HDL 58.  NSTEMI (non-ST elevated myocardial infarction) (HCC) History of non-STEMI 03/21/2023.  She underwent cardiac catheterization by Dr. Abel Hoe the following day revealing a high-grade calcified distal LAD stenosis, high-grade calcified ostial nondominant circumflex stenosis and 60% proximal LAD stenosis.  The following day she underwent orbital atherectomy, PCI drug-eluting stenting by Dr. Alvenia Aus of the RCA with an excellent result.  She had normal LV function.  She is on aspirin  and Brilinta .     Avanell Leigh MD [facility], [facility] 01/04/2024 1:54 PM

## 2024-01-04 NOTE — Assessment & Plan Note (Signed)
 History of essential hypertension with blood pressure measured today at 122/50.  She is on Lotensin /hydrochlorothiazide  and metoprolol .

## 2024-01-04 NOTE — Assessment & Plan Note (Signed)
 History of hyperlipidemia on statin therapy with lipid profile performed 10//24 revealing total cholesterol 113, LDL 41 and HDL 58.

## 2024-01-06 ENCOUNTER — Other Ambulatory Visit (HOSPITAL_COMMUNITY): Payer: Self-pay

## 2024-01-13 DIAGNOSIS — E1169 Type 2 diabetes mellitus with other specified complication: Secondary | ICD-10-CM | POA: Diagnosis not present

## 2024-01-13 DIAGNOSIS — E039 Hypothyroidism, unspecified: Secondary | ICD-10-CM | POA: Diagnosis not present

## 2024-01-13 DIAGNOSIS — E7800 Pure hypercholesterolemia, unspecified: Secondary | ICD-10-CM | POA: Diagnosis not present

## 2024-01-13 DIAGNOSIS — I10 Essential (primary) hypertension: Secondary | ICD-10-CM | POA: Diagnosis not present

## 2024-01-13 DIAGNOSIS — R600 Localized edema: Secondary | ICD-10-CM | POA: Diagnosis not present

## 2024-01-13 DIAGNOSIS — I214 Non-ST elevation (NSTEMI) myocardial infarction: Secondary | ICD-10-CM | POA: Diagnosis not present

## 2024-01-25 ENCOUNTER — Other Ambulatory Visit (HOSPITAL_COMMUNITY): Payer: Self-pay

## 2024-01-27 ENCOUNTER — Other Ambulatory Visit (HOSPITAL_COMMUNITY): Payer: Self-pay

## 2024-02-16 NOTE — Research (Signed)
 Rande Bushy Informed Consent   Subject Name: Suzanne Sutton  Subject met inclusion and exclusion criteria.  The informed consent form, study requirements and expectations were reviewed with the subject and questions and concerns were addressed prior to the signing of the consent form.  The subject verbalized understanding of the trial requirements.  The subject agreed to participate in the [place] trial and signed the informed consent at 1400 on 02/Apr/2025.  The informed consent was obtained prior to performance of any protocol-specific procedures for the subject.  A copy of the signed informed consent was given to the subject and a copy was placed in the subject's medical record.     Subject re-consented to  Version: 6.0 IRB approved: 17/Mar/2025  Polo Brisk Khaleelah Yowell

## 2024-03-21 ENCOUNTER — Encounter

## 2024-03-21 VITALS — BP 132/53 | HR 71

## 2024-03-21 DIAGNOSIS — Z006 Encounter for examination for normal comparison and control in clinical research program: Secondary | ICD-10-CM

## 2024-03-21 MED ORDER — STUDY - LIBREXIA-ACS - MILVEXIAN 25 MG OR PLACEBO TABLET (PI-STUCKEY): 1.0000 | ORAL_TABLET | Freq: Two times a day (BID) | ORAL | 0 refills | Status: AC

## 2024-03-21 NOTE — Research (Cosign Needed Addendum)
 LIBREXIA ACS 52 WEEK VISIT   EQ-5D-5L Assessment Completed? [x] Yes [] No Reason not done: [] Subject Forgot [] Subject too ill [] Subject refused [] Technical failure [] Other If Other, Specify Collection Date: 09/Jul/2025   [facility] Hematology/Chemistry drawn: [x] Yes [] No   Medication Kit Accountability Dispensed Status [x] Dispensed [] Dispensed In Error [] Not Dispensed If 'Not Dispensed', provide reason: [] Medical Decision [] Medication Kit not available or damaged [] Study medication discontinued [] Visit skipped or administration skipped [] Medication kit dispensed in error Date Dispensed: 09/Jul/2025 Amount Dispensed: [id number] (Milvexian) 25mg  or Placebo [phone]  [id number] (Milvexian) 25mg  or Placebo [phone]  [id number] (Milvexian) 25mg  or Placebo [phone]  [id number] (Milvexian) 25mg  or Placebo [phone]  [id number] (Milvexian) 25mg  or Placebo [phone]  [id number] (Milvexian) 25mg  or Placebo [id number]  Return Status: [] Damaged/Returned by subject [] Missing/Not returned by subject [x] Returned by subject If not returned reasons: [] Forgot [] Lost Date Returned: 09/Jul/2025 Amount Returned: Medication Number: [phone] Date: 92907974 Amount Returned: 0 Tablets  Medication Number: [id number] Date: 92907974 Amount Returned: 16 Tablets  Medication Number: [id number] Date: 92907974 Amount Returned: 70 Tablets  Medication Number: [phone] Date: 92907974 Amount Returned: 0 Tablets   Compliance%: 99   Were any suspected endpoint events or adverse events experienced? [] Yes [x] No Previous symptoms of knee edema/ambulation difficulties have resolved without intervention   Current Outpatient Medications:    aspirin  EC 81 MG tablet, Take 1 tablet (81 mg total) by mouth daily. Swallow whole., Disp: 120 tablet, Rfl: 3   atorvastatin  (LIPITOR) 40 MG tablet, Take 1 tablet (40 mg total) by mouth daily., Disp: 90 tablet, Rfl: 3   azelastine (ASTELIN) 137 MCG/SPRAY nasal spray,  Place 1 spray into the nose daily as needed for allergies (post nasal drip). Use in each nostril as directed, Disp: , Rfl:    benazepril -hydrochlorthiazide (LOTENSIN  HCT) 20-25 MG tablet, Take 1 tablet by mouth daily., Disp: 90 tablet, Rfl: 3   Beta Carotene (VITAMIN A) 25000 UNIT capsule, Take 25,000 Units by mouth daily., Disp: , Rfl:    clonazePAM  (KLONOPIN ) 0.5 MG tablet, Take 1/2-1 tablet (0.25-0.5 mg total) by mouth as needed., Disp: 30 tablet, Rfl: 0   Continuous Glucose Sensor (FREESTYLE LIBRE 3 SENSOR) MISC, Change sensor every 14 days as directed., Disp: 2 each, Rfl: 5   fexofenadine (ALLEGRA) 180 MG tablet, Take 180 mg by mouth daily., Disp: , Rfl:    glucose blood (FREESTYLE TEST STRIPS) test strip, USE AS INSTRUCTED TO CHECK BLOOD SUGAR ONCE DAILY DX CODE E11.9, Disp: 50 each, Rfl: 0   levothyroxine  (SYNTHROID ) 50 MCG tablet, Take 1 tablet (50 mcg total) by mouth every morning on an empty stomach, Disp: 90 tablet, Rfl: 3   metFORMIN  (GLUCOPHAGE ) 500 MG tablet, Take 1 tablet (500 mg total) by mouth 2 (two) times daily with a meal., Disp: 180 tablet, Rfl: 3   metoprolol  succinate (TOPROL -XL) 25 MG 24 hr tablet, Take 1 tablet (25 mg total) by mouth daily., Disp: 90 tablet, Rfl: 3   nitroGLYCERIN  (NITROSTAT ) 0.4 MG SL tablet, Place 1 tablet (0.4 mg total) under the tongue every 5 (five) minutes x 3 doses as needed for chest pain., Disp: 25 tablet, Rfl: 3   Potassium Chloride  ER 20 MEQ TBCR, Take 1 tablet (20 mEq total) by mouth daily., Disp: 90 tablet, Rfl: 3   PROAIR  HFA 108 (90 BASE) MCG/ACT inhaler, , Disp: , Rfl: 1   Study - LIBREXIA-ACS - milvexian 25 mg or placebo tablet (PI-Stuckey), Take 1 tablet by mouth 2 (two) times daily. Take with or without food. Bring bottle back to  every visit. For Investigational Use Only. Please contact [facility] for any questions or concerns regarding this medication., Disp: 210 tablet, Rfl: 0   Study - LIBREXIA-ACS - milvexian 25 mg or  placebo tablet (PI-Stuckey), Take 1 tablet by mouth 2 (two) times daily. For Investigational Use Only. Take with or without food. Bring bottle back to every visit., Disp: 420 tablet, Rfl: 0   ticagrelor  (BRILINTA ) 90 MG TABS tablet, Take 1 tablet (90 mg total) by mouth 2 (two) times daily., Disp: 180 tablet, Rfl: 3   albuterol  (VENTOLIN  HFA) 108 (90 Base) MCG/ACT inhaler, Inhale 1 puff into the lungs every 4 (four) hours as needed. (Patient not taking: Reported on 03/21/2024), Disp: 6.7 g, Rfl: 1

## 2024-03-23 ENCOUNTER — Other Ambulatory Visit (HOSPITAL_COMMUNITY): Payer: Self-pay

## 2024-03-23 DIAGNOSIS — H18413 Arcus senilis, bilateral: Secondary | ICD-10-CM | POA: Diagnosis not present

## 2024-03-23 DIAGNOSIS — H2511 Age-related nuclear cataract, right eye: Secondary | ICD-10-CM | POA: Diagnosis not present

## 2024-03-23 DIAGNOSIS — H2513 Age-related nuclear cataract, bilateral: Secondary | ICD-10-CM | POA: Diagnosis not present

## 2024-03-23 DIAGNOSIS — H25043 Posterior subcapsular polar age-related cataract, bilateral: Secondary | ICD-10-CM | POA: Diagnosis not present

## 2024-03-23 DIAGNOSIS — H25013 Cortical age-related cataract, bilateral: Secondary | ICD-10-CM | POA: Diagnosis not present

## 2024-03-23 MED ORDER — PREDNISOLONE ACETATE 1 % OP SUSP
1.0000 [drp] | Freq: Four times a day (QID) | OPHTHALMIC | 1 refills | Status: AC
  Filled 2024-03-23: qty 5, 25d supply, fill #0
  Filled 2024-05-16 (×2): qty 5, 15d supply, fill #1

## 2024-03-23 MED ORDER — GATIFLOXACIN 0.5 % OP SOLN
1.0000 [drp] | Freq: Four times a day (QID) | OPHTHALMIC | 1 refills | Status: AC
  Filled 2024-03-23: qty 5, 25d supply, fill #0
  Filled 2024-05-16: qty 2.5, 10d supply, fill #1
  Filled 2024-05-16: qty 5, 25d supply, fill #1
  Filled 2024-05-16: qty 2.5, 13d supply, fill #1
  Filled 2024-05-16: qty 5, 25d supply, fill #1

## 2024-03-23 MED ORDER — KETOROLAC TROMETHAMINE 0.5 % OP SOLN
1.0000 [drp] | Freq: Four times a day (QID) | OPHTHALMIC | 1 refills | Status: AC
  Filled 2024-03-23: qty 5, 25d supply, fill #0
  Filled 2024-05-16: qty 5, 25d supply, fill #1
  Filled 2024-05-16: qty 5, 15d supply, fill #1
  Filled 2024-05-30: qty 5, 15d supply, fill #2

## 2024-03-27 NOTE — Research (Addendum)
Are there any labs that are clinically significant?  Yes [] OR No[x]  Is the patient eligible to continue enrollment in the study after screening visit?  Yes [x]  OR No[]          

## 2024-04-04 ENCOUNTER — Other Ambulatory Visit (HOSPITAL_COMMUNITY): Payer: Self-pay

## 2024-04-05 ENCOUNTER — Other Ambulatory Visit (HOSPITAL_COMMUNITY): Payer: Self-pay

## 2024-04-05 MED ORDER — FREESTYLE LIBRE 3 SENSOR MISC
1.0000 | 5 refills | Status: DC
  Filled 2024-04-05 – 2024-04-18 (×3): qty 2, 28d supply, fill #0
  Filled 2024-05-14: qty 2, 28d supply, fill #1

## 2024-04-06 ENCOUNTER — Other Ambulatory Visit (HOSPITAL_COMMUNITY): Payer: Self-pay

## 2024-04-18 ENCOUNTER — Other Ambulatory Visit: Payer: Self-pay

## 2024-04-18 ENCOUNTER — Other Ambulatory Visit (HOSPITAL_COMMUNITY): Payer: Self-pay

## 2024-05-02 DIAGNOSIS — Z006 Encounter for examination for normal comparison and control in clinical research program: Secondary | ICD-10-CM

## 2024-05-02 NOTE — Research (Signed)
 Spoke to patient about upcoming cataract surgery on 05/11/24 and 06/01/24. Instructed patient to hold study drug 3 days prior to procedure and 5 days after, per PI Stuckey. Re educated patient about s/sx of bleeding and to contact us  with any concerns. Patient verbalized consent to releasing medical records to [facility] from [facility] in [place] and pt will call to give our fax number.

## 2024-05-04 ENCOUNTER — Encounter: Payer: Self-pay | Admitting: Cardiovascular Disease

## 2024-05-04 NOTE — Telephone Encounter (Signed)
 Error

## 2024-05-10 DIAGNOSIS — Z006 Encounter for examination for normal comparison and control in clinical research program: Secondary | ICD-10-CM

## 2024-05-10 NOTE — Research (Signed)
 Called patient to notify of Morris, MD's directions to hold study drug until after second eye surgery has been completed. Did not answer, LVM to call research office back.

## 2024-05-11 ENCOUNTER — Other Ambulatory Visit: Payer: Self-pay

## 2024-05-11 ENCOUNTER — Other Ambulatory Visit (HOSPITAL_COMMUNITY): Payer: Self-pay

## 2024-05-11 DIAGNOSIS — H25042 Posterior subcapsular polar age-related cataract, left eye: Secondary | ICD-10-CM | POA: Diagnosis not present

## 2024-05-11 DIAGNOSIS — H2512 Age-related nuclear cataract, left eye: Secondary | ICD-10-CM | POA: Diagnosis not present

## 2024-05-11 DIAGNOSIS — H2511 Age-related nuclear cataract, right eye: Secondary | ICD-10-CM | POA: Diagnosis not present

## 2024-05-11 DIAGNOSIS — H25012 Cortical age-related cataract, left eye: Secondary | ICD-10-CM | POA: Diagnosis not present

## 2024-05-11 MED ORDER — KETOROLAC TROMETHAMINE 0.5 % OP SOLN
1.0000 [drp] | Freq: Two times a day (BID) | OPHTHALMIC | 1 refills | Status: AC
  Filled 2024-05-11: qty 5, 50d supply, fill #0
  Filled 2024-05-30 (×2): qty 10, 100d supply, fill #0

## 2024-05-11 MED ORDER — GATIFLOXACIN 0.5 % OP SOLN
1.0000 [drp] | Freq: Four times a day (QID) | OPHTHALMIC | 1 refills | Status: AC
  Filled 2024-05-11: qty 5, 25d supply, fill #0

## 2024-05-11 MED ORDER — PREDNISOLONE ACETATE 1 % OP SUSP
1.0000 [drp] | Freq: Four times a day (QID) | OPHTHALMIC | 1 refills | Status: AC
  Filled 2024-05-11: qty 5, 25d supply, fill #0
  Filled 2024-05-16: qty 5, 15d supply, fill #0

## 2024-05-16 ENCOUNTER — Other Ambulatory Visit (HOSPITAL_COMMUNITY): Payer: Self-pay

## 2024-05-23 ENCOUNTER — Ambulatory Visit: Admitting: Dermatology

## 2024-05-30 ENCOUNTER — Other Ambulatory Visit (HOSPITAL_COMMUNITY): Payer: Self-pay

## 2024-05-30 ENCOUNTER — Other Ambulatory Visit: Payer: Self-pay

## 2024-05-31 ENCOUNTER — Other Ambulatory Visit (HOSPITAL_COMMUNITY): Payer: Self-pay

## 2024-06-01 DIAGNOSIS — H2512 Age-related nuclear cataract, left eye: Secondary | ICD-10-CM | POA: Diagnosis not present

## 2024-06-01 DIAGNOSIS — H25012 Cortical age-related cataract, left eye: Secondary | ICD-10-CM | POA: Diagnosis not present

## 2024-06-01 DIAGNOSIS — H25042 Posterior subcapsular polar age-related cataract, left eye: Secondary | ICD-10-CM | POA: Diagnosis not present

## 2024-06-11 ENCOUNTER — Other Ambulatory Visit: Payer: Self-pay

## 2024-06-11 NOTE — Research (Addendum)
 Compliance%: 87  Subject held morning dose of IP prior to PK draw. Last dose of IP prior to PK draw is 07/Jan/2025 at 2100.

## 2024-06-12 ENCOUNTER — Other Ambulatory Visit (HOSPITAL_COMMUNITY): Payer: Self-pay

## 2024-06-12 MED ORDER — FREESTYLE LIBRE 3 SENSOR MISC
1 refills | Status: AC
  Filled 2024-06-12: qty 6, 84d supply, fill #0
  Filled 2024-10-19 (×2): qty 6, 84d supply, fill #1
  Filled ????-??-??: fill #1

## 2024-06-22 ENCOUNTER — Telehealth: Payer: Self-pay | Admitting: Cardiovascular Disease

## 2024-06-22 NOTE — Telephone Encounter (Signed)
 BP up a little this morning. She was overstressed on Monday- reports having some palpitations.   She reports that she has had palpitations/irregular heart ever since. No chest pain, shortness of breath, or dizziness. She reports that she is just a little tired- but normal for her after working.   Her bp was 141/63. Asked her to keep a daily log of her bp- check it 2 hours after taking your morning meds.   Appt made with Dr Court for 06/25/24. Given ER precautions. She verbalized understanding.

## 2024-06-22 NOTE — Telephone Encounter (Signed)
 Patient c/o Palpitations:  STAT if patient reporting lightheadedness, shortness of breath, or chest pain  How long have you had palpitations/irregular HR/ Afib? Are you having the symptoms now? Irregular HR since Monday. None right now.  Are you currently experiencing lightheadedness, SOB or CP? no  Do you have a history of afib (atrial fibrillation) or irregular heart rhythm? no  Have you checked your BP or HR? (document readings if available): BP: 141/63 HR: 57  Are you experiencing any other symptoms? No symptoms

## 2024-06-25 ENCOUNTER — Ambulatory Visit: Attending: Cardiovascular Disease | Admitting: Cardiovascular Disease

## 2024-06-25 ENCOUNTER — Encounter: Payer: Self-pay | Admitting: Cardiovascular Disease

## 2024-06-25 VITALS — BP 136/84 | HR 74 | Ht 66.0 in | Wt 155.6 lb

## 2024-06-25 DIAGNOSIS — I214 Non-ST elevation (NSTEMI) myocardial infarction: Secondary | ICD-10-CM | POA: Diagnosis not present

## 2024-06-25 DIAGNOSIS — I10 Essential (primary) hypertension: Secondary | ICD-10-CM | POA: Diagnosis not present

## 2024-06-25 DIAGNOSIS — I2510 Atherosclerotic heart disease of native coronary artery without angina pectoris: Secondary | ICD-10-CM

## 2024-06-25 DIAGNOSIS — R002 Palpitations: Secondary | ICD-10-CM | POA: Insufficient documentation

## 2024-06-25 DIAGNOSIS — E782 Mixed hyperlipidemia: Secondary | ICD-10-CM

## 2024-06-25 NOTE — Assessment & Plan Note (Signed)
 History of essential hypertension blood pressure measured today at 136/84.  She is on Lotensin /hydrochlorothiazide  and metoprolol .

## 2024-06-25 NOTE — Patient Instructions (Signed)
 Medication Instructions:  Your physician recommends that you continue on your current medications as directed. Please refer to the Current Medication list given to you today.  *If you need a refill on your cardiac medications before your next appointment, please call your pharmacy*  Follow-Up: At [facility], you and your health needs are our priority.  As part of our continuing mission to provide you with exceptional heart care, our providers are all part of one team.  This team includes your primary Cardiologist (physician) and Advanced Practice Providers or APPs (Physician Assistants and Nurse Practitioners) who all work together to provide you with the care you need, when you need it.  Your next appointment:   3 month(s)   Provider:   Glendia Ferrier, PA-C or Katlyn West, NP         Then, Dorn Lesches, MD will plan to see you again in 12 month(s).

## 2024-06-25 NOTE — Assessment & Plan Note (Signed)
 History of non-STEMI 7/88/24.  She underwent catheterization by Dr. Verlin revealing 80% calcified mid dominant RCA stenosis, 60% proximal LAD stenosis and 90% proximal nondominant circumflex stenosis.  She had unsuccessful attempt at shockwave angioplasty.  The following day Dr. Darron performed orbital atherectomy and PCI and drug-eluting stenting.  She has been asymptomatic since.

## 2024-06-25 NOTE — Assessment & Plan Note (Signed)
 History of hyperlipidemia on statin therapy with lipid profile performed 5/2//25 revealing total Lester 125, LDL 45 and HDL of 65.

## 2024-06-25 NOTE — Assessment & Plan Note (Signed)
 Patient is noticed palpitations last week during a stressful week at work and also on her blood pressure machine she has noticed skipped beats.  I suggested that we continue to watch this over the next 3 months.  If in 3 months she is having continued palpitations we will get an event monitor to further evaluate.

## 2024-06-25 NOTE — Progress Notes (Signed)
 06/25/2024 Suzanne Sutton Riding   Mar 03, 1946  [id number]  Primary Physician Claudene Lacks, MD Primary Cardiologist: Dorn JINNY Lesches MD [facility], [place], [place]  HPI:  Suzanne Sutton is a 78 y.o.   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  I last saw her in the office 01/04/2024.  She is accompanied by one of her daughters Suzanne Sutton today.  I met her when she was in the hospital in July for a non-STEMI.  Risk factors include treated hypertension, diabetes and hyperlipidemia.  She has a strong family history of heart disease with both parents and a brother all who have had ischemic heart disease at a young age.  She presented on 03/21/2023 with chest pain and positive enzymes.  The following day she had cardiac catheterization by Dr. Verlin revealing 80% calcified mid dominant RCA stenosis, 60% proximal LAD stenosis and 90% proximal nondominant circumflex stenosis.  She had unsuccessful attempt at intervention of her RCA using shockwave angioplasty and the following day she underwent successful orbital atherectomy, PCI and drug-eluting stenting by Dr. Darron .  She was discharged home 2 days later.    Since I saw her 6 months ago she has remained stable.  She did have a stressful week at work last week and her symptoms and palpitations.  She denies chest pain or shortness of breath.   Current Meds  Medication Sig   albuterol  (VENTOLIN  HFA) 108 (90 Base) MCG/ACT inhaler Inhale 1 puff into the lungs every 4 (four) hours as needed.   aspirin  EC 81 MG tablet Take 1 tablet (81 mg total) by mouth daily. Swallow whole.   atorvastatin  (LIPITOR) 40 MG tablet Take 1 tablet (40 mg total) by mouth daily.   azelastine (ASTELIN) 137 MCG/SPRAY nasal spray Place 1 spray into the nose daily as needed for allergies (post nasal drip). Use in each nostril as directed   benazepril -hydrochlorthiazide (LOTENSIN  HCT) 20-25 MG tablet Take 1 tablet by  mouth daily.   Beta Carotene (VITAMIN A) 25000 UNIT capsule Take 25,000 Units by mouth daily.   clonazePAM  (KLONOPIN ) 0.5 MG tablet Take 1/2-1 tablet (0.25-0.5 mg total) by mouth as needed.   Continuous Glucose Sensor (FREESTYLE LIBRE 3 SENSOR) MISC Change sensor every 14 days as directed. Use to monitor blood glucose as directed   fexofenadine (ALLEGRA) 180 MG tablet Take 180 mg by mouth daily.   gatifloxacin  (ZYMAXID ) 0.5 % SOLN Place 1 drop into the right eye 4 (four) times daily. Store upside down!   gatifloxacin  (ZYMAXID ) 0.5 % SOLN Place 1 drop into the left eye 4 (four) times daily as directed. Store upside down!   glucose blood (FREESTYLE TEST STRIPS) test strip USE AS INSTRUCTED TO CHECK BLOOD SUGAR ONCE DAILY DX CODE E11.9   ketorolac  (ACULAR ) 0.5 % ophthalmic solution Place 1 drop into the right eye 4 (four) times daily as directed.   ketorolac  (ACULAR ) 0.5 % ophthalmic solution Place 1 drop into the left eye 2 (two) times daily as directed   levothyroxine  (SYNTHROID ) 50 MCG tablet Take 1 tablet (50 mcg total) by mouth every morning on an empty stomach   metFORMIN  (GLUCOPHAGE ) 500 MG tablet Take 1 tablet (500 mg total) by mouth 2 (two) times daily with a meal.   metoprolol  succinate (TOPROL -XL) 25 MG 24 hr tablet Take 1 tablet (25 mg total) by mouth daily.   nitroGLYCERIN  (NITROSTAT ) 0.4 MG SL tablet Place  1 tablet (0.4 mg total) under the tongue every 5 (five) minutes x 3 doses as needed for chest pain.   Potassium Chloride  ER 20 MEQ TBCR Take 1 tablet (20 mEq total) by mouth daily.   prednisoLONE  acetate (PRED FORTE ) 1 % ophthalmic suspension Place 1 drop into the right eye 4 (four) times daily. Shake well before each use.   prednisoLONE  acetate (PRED FORTE ) 1 % ophthalmic suspension Shake well before each use and place 1 drop into the left eye 4 (four) times daily as directed.   PROAIR  HFA 108 (90 BASE) MCG/ACT inhaler    Study - LIBREXIA-ACS - milvexian 25 mg or placebo tablet  (PI-Stuckey) Take 1 tablet by mouth 2 (two) times daily. Take with or without food. Bring bottle back to every visit. For Investigational Use Only. Please contact [facility] for any questions or concerns regarding this medication.   Study - LIBREXIA-ACS - milvexian 25 mg or placebo tablet (PI-Stuckey) Take 1 tablet by mouth 2 (two) times daily. For Investigational Use Only. Take with or without food. Bring bottle back to every visit.   ticagrelor  (BRILINTA ) 90 MG TABS tablet Take 1 tablet (90 mg total) by mouth 2 (two) times daily.     Allergies  Allergen Reactions   Cozaar [Losartan Potassium] Swelling    Had knee swelling   Crestor [Rosuvastatin] Swelling    Leg swelling    Social History   Socioeconomic History   Marital status: Widowed    Spouse name: Not on file   Number of children: Not on file   Years of education: Not on file   Highest education level: Not on file  Occupational History   Not on file  Tobacco Use   Smoking status: Never   Smokeless tobacco: Never  Substance and Sexual Activity   Alcohol use: Not on file   Drug use: Not on file   Sexual activity: Not on file  Other Topics Concern   Not on file  Social History Narrative   Not on file   Social Drivers of Health   Financial Resource Strain: Not on file  Food Insecurity: No Food Insecurity (03/21/2023)   Hunger Vital Sign    Worried About Running Out of Food in the Last Year: Never true    Ran Out of Food in the Last Year: Never true  Transportation Needs: No Transportation Needs (03/21/2023)   PRAPARE - Administrator, Civil Service (Medical): No    Lack of Transportation (Non-Medical): No  Physical Activity: Not on file  Stress: Not on file  Social Connections: Not on file  Intimate Partner Violence: Not At Risk (03/21/2023)   Humiliation, Afraid, Rape, and Kick questionnaire    Fear of Current or Ex-Partner: No    Emotionally Abused: No    Physically Abused: No     Sexually Abused: No     Review of Systems: General: negative for chills, fever, night sweats or weight changes.  Cardiovascular: negative for chest pain, dyspnea on exertion, edema, orthopnea, palpitations, paroxysmal nocturnal dyspnea or shortness of breath Dermatological: negative for rash Respiratory: negative for cough or wheezing Urologic: negative for hematuria Abdominal: negative for nausea, vomiting, diarrhea, bright red blood per rectum, melena, or hematemesis Neurologic: negative for visual changes, syncope, or dizziness All other systems reviewed and are otherwise negative except as noted above.    Blood pressure 136/84, pulse 74, height 5' 6 (1.676 m), weight 155 lb 9.6 oz (70.6 kg), SpO2 95%.  General appearance: alert and no distress Neck: no adenopathy, no carotid bruit, no JVD, supple, symmetrical, trachea midline, and thyroid  not enlarged, symmetric, no tenderness/mass/nodules Lungs: clear to auscultation bilaterally Heart: regular rate and rhythm, S1, S2 normal, no murmur, click, rub or gallop Extremities: extremities normal, atraumatic, no cyanosis or edema Pulses: 2+ and symmetric Skin: Skin color, texture, turgor normal. No rashes or lesions Neurologic: Grossly normal  EKG EKG Interpretation Date/Time:  Monday June 25 2024 13:53:29 EDT Ventricular Rate:  74 PR Interval:  178 QRS Duration:  82 QT Interval:  398 QTC Calculation: 441 R Axis:   -22  Text Interpretation: Normal sinus rhythm Minimal voltage criteria for LVH, may be normal variant ( R in aVL ) Septal infarct (cited on or before 04-Jan-2024) When compared with ECG of 04-Jan-2024 13:34, Questionable change in initial forces of Septal leads Confirmed by Court Carrier [phone]) on 06/25/2024 2:06:43 PM    ASSESSMENT AND PLAN:   Hyperlipidemia History of hyperlipidemia on statin therapy with lipid profile performed 5/2//25 revealing total Lester 125, LDL 45 and HDL of 65.  Essential  hypertension, benign History of essential hypertension blood pressure measured today at 136/84.  She is on Lotensin /hydrochlorothiazide  and metoprolol .  NSTEMI (non-ST elevated myocardial infarction) (HCC) History of non-STEMI 7/88/24.  She underwent catheterization by Dr. Verlin revealing 80% calcified mid dominant RCA stenosis, 60% proximal LAD stenosis and 90% proximal nondominant circumflex stenosis.  She had unsuccessful attempt at shockwave angioplasty.  The following day Dr. Darron performed orbital atherectomy and PCI and drug-eluting stenting.  She has been asymptomatic since.  Palpitations Patient is noticed palpitations last week during a stressful week at work and also on her blood pressure machine she has noticed skipped beats.  I suggested that we continue to watch this over the next 3 months.  If in 3 months she is having continued palpitations we will get an event monitor to further evaluate.     Carrier DOROTHA Court MD FACP,FACC,FAHA, [facility] 06/25/2024 2:15 PM

## 2024-06-27 ENCOUNTER — Other Ambulatory Visit: Payer: Self-pay | Admitting: Cardiology

## 2024-06-27 DIAGNOSIS — Z006 Encounter for examination for normal comparison and control in clinical research program: Secondary | ICD-10-CM

## 2024-06-27 NOTE — Research (Signed)
 LIBREXIA ACS 65 WEEK REMOTE VISIT    Medication Kit Accountability Dispensed Status [] Dispensed [] Dispensed In Error [x] Not Dispensed If 'Not Dispensed', provide reason: [] Medical Decision [] Medication Kit not available or damaged [] Study medication discontinued [x] Remote Visit [] Medication kit dispensed in error    Were any suspected endpoint events or adverse events experienced? [x] Yes [] No Pt states she has noticed palpitations when had a stressful day at work last Monday. Has seen Dr. Court for this who recommends to monitor and if persists, will want heart monitor. No s/sx of bleeding after successful bilateral cataract surgery on 05/11/24 and 06/01/24.    Pt stopped taking IP on 05/08/24 before first cataract surgery. Pt was unsure when to resume IP and has not resumed taking since. Advised pt to restart IP tonight on 06/27/24 and resume on a BID schedule. Pt verbalizes understanding.    Current Outpatient Medications:    albuterol  (VENTOLIN  HFA) 108 (90 Base) MCG/ACT inhaler, Inhale 1 puff into the lungs every 4 (four) hours as needed., Disp: 6.7 g, Rfl: 1   aspirin  EC 81 MG tablet, Take 1 tablet (81 mg total) by mouth daily. Swallow whole., Disp: 120 tablet, Rfl: 3   atorvastatin  (LIPITOR) 40 MG tablet, Take 1 tablet (40 mg total) by mouth daily., Disp: 90 tablet, Rfl: 3   azelastine (ASTELIN) 137 MCG/SPRAY nasal spray, Place 1 spray into the nose daily as needed for allergies (post nasal drip). Use in each nostril as directed, Disp: , Rfl:    benazepril -hydrochlorthiazide (LOTENSIN  HCT) 20-25 MG tablet, Take 1 tablet by mouth daily., Disp: 90 tablet, Rfl: 3   Beta Carotene (VITAMIN A) 25000 UNIT capsule, Take 25,000 Units by mouth daily., Disp: , Rfl:    clonazePAM  (KLONOPIN ) 0.5 MG tablet, Take 1/2-1 tablet (0.25-0.5 mg total) by mouth as needed., Disp: 30 tablet, Rfl: 0   Continuous Glucose Sensor (FREESTYLE LIBRE 3 SENSOR) MISC, Change sensor every 14 days as directed. Use to  monitor blood glucose as directed, Disp: 10 each, Rfl: 1   fexofenadine (ALLEGRA) 180 MG tablet, Take 180 mg by mouth daily., Disp: , Rfl:    glucose blood (FREESTYLE TEST STRIPS) test strip, USE AS INSTRUCTED TO CHECK BLOOD SUGAR ONCE DAILY DX CODE E11.9, Disp: 50 each, Rfl: 0   ketorolac  (ACULAR ) 0.5 % ophthalmic solution, Place 1 drop into the right eye 4 (four) times daily as directed., Disp: 10 mL, Rfl: 1   ketorolac  (ACULAR ) 0.5 % ophthalmic solution, Place 1 drop into the left eye 2 (two) times daily as directed, Disp: 10 mL, Rfl: 1   levothyroxine  (SYNTHROID ) 50 MCG tablet, Take 1 tablet (50 mcg total) by mouth every morning on an empty stomach, Disp: 90 tablet, Rfl: 3   metFORMIN  (GLUCOPHAGE ) 500 MG tablet, Take 1 tablet (500 mg total) by mouth 2 (two) times daily with a meal., Disp: 180 tablet, Rfl: 3   metoprolol  succinate (TOPROL -XL) 25 MG 24 hr tablet, Take 1 tablet (25 mg total) by mouth daily., Disp: 90 tablet, Rfl: 3   nitroGLYCERIN  (NITROSTAT ) 0.4 MG SL tablet, Place 1 tablet (0.4 mg total) under the tongue every 5 (five) minutes x 3 doses as needed for chest pain., Disp: 25 tablet, Rfl: 3   Potassium Chloride  ER 20 MEQ TBCR, Take 1 tablet (20 mEq total) by mouth daily., Disp: 90 tablet, Rfl: 3   prednisoLONE  acetate (PRED FORTE ) 1 % ophthalmic suspension, Place 1 drop into the right eye 4 (four) times daily. Shake well before each use., Disp:  5 mL, Rfl: 1   prednisoLONE  acetate (PRED FORTE ) 1 % ophthalmic suspension, Shake well before each use and place 1 drop into the left eye 4 (four) times daily as directed., Disp: 5 mL, Rfl: 1   ticagrelor  (BRILINTA ) 90 MG TABS tablet, Take 1 tablet (90 mg total) by mouth 2 (two) times daily., Disp: 180 tablet, Rfl: 3   gatifloxacin  (ZYMAXID ) 0.5 % SOLN, Place 1 drop into the right eye 4 (four) times daily. Store upside down! (Patient not taking: Reported on 06/27/2024), Disp: 5 mL, Rfl: 1   gatifloxacin  (ZYMAXID ) 0.5 % SOLN, Place 1 drop into the  left eye 4 (four) times daily as directed. Store upside down! (Patient not taking: Reported on 06/27/2024), Disp: 5 mL, Rfl: 1   PROAIR  HFA 108 (90 BASE) MCG/ACT inhaler, , Disp: , Rfl: 1   Study - LIBREXIA-ACS - milvexian 25 mg or placebo tablet (PI-Stuckey), Take 1 tablet by mouth 2 (two) times daily. Take with or without food. Bring bottle back to every visit. For Investigational Use Only. Please contact [facility] for any questions or concerns regarding this medication. (Patient not taking: Reported on 06/27/2024), Disp: 210 tablet, Rfl: 0   Study - LIBREXIA-ACS - milvexian 25 mg or placebo tablet (PI-Stuckey), Take 1 tablet by mouth 2 (two) times daily. For Investigational Use Only. Take with or without food. Bring bottle back to every visit. (Patient not taking: Reported on 06/27/2024), Disp: 420 tablet, Rfl: 0

## 2024-07-02 ENCOUNTER — Other Ambulatory Visit (HOSPITAL_COMMUNITY): Payer: Self-pay

## 2024-07-02 MED ORDER — ATORVASTATIN CALCIUM 40 MG PO TABS
40.0000 mg | ORAL_TABLET | Freq: Every day | ORAL | 3 refills | Status: AC
  Filled 2024-07-02: qty 90, 90d supply, fill #0
  Filled 2024-09-26: qty 90, 90d supply, fill #1

## 2024-07-02 MED ORDER — METOPROLOL SUCCINATE ER 25 MG PO TB24
25.0000 mg | ORAL_TABLET | Freq: Every day | ORAL | 3 refills | Status: AC
  Filled 2024-07-02: qty 90, 90d supply, fill #0
  Filled 2024-09-26: qty 90, 90d supply, fill #1

## 2024-07-16 ENCOUNTER — Other Ambulatory Visit: Payer: Self-pay | Admitting: Cardiology

## 2024-07-18 ENCOUNTER — Other Ambulatory Visit (HOSPITAL_COMMUNITY): Payer: Self-pay

## 2024-07-18 MED ORDER — TICAGRELOR 90 MG PO TABS
90.0000 mg | ORAL_TABLET | Freq: Two times a day (BID) | ORAL | 3 refills | Status: AC
  Filled 2024-07-18: qty 180, 90d supply, fill #0
  Filled 2024-10-19: qty 180, 90d supply, fill #1

## 2024-07-19 ENCOUNTER — Other Ambulatory Visit (HOSPITAL_COMMUNITY): Payer: Self-pay

## 2024-07-19 ENCOUNTER — Other Ambulatory Visit: Payer: Self-pay

## 2024-07-19 DIAGNOSIS — J209 Acute bronchitis, unspecified: Secondary | ICD-10-CM | POA: Diagnosis not present

## 2024-07-19 MED ORDER — PROMETHAZINE HCL 6.25 MG/5ML PO SOLN
12.5000 mg | Freq: Every evening | ORAL | 0 refills | Status: AC | PRN
  Filled 2024-07-19: qty 50, 5d supply, fill #0

## 2024-07-19 MED ORDER — AMOXICILLIN-POT CLAVULANATE 875-125 MG PO TABS
1.0000 | ORAL_TABLET | Freq: Two times a day (BID) | ORAL | 0 refills | Status: AC
  Filled 2024-07-19: qty 10, 5d supply, fill #0

## 2024-07-19 MED ORDER — AZELASTINE HCL 0.1 % NA SOLN
1.0000 | Freq: Two times a day (BID) | NASAL | 1 refills | Status: AC
  Filled 2024-07-19: qty 30, 25d supply, fill #0

## 2024-07-27 DIAGNOSIS — Z006 Encounter for examination for normal comparison and control in clinical research program: Secondary | ICD-10-CM

## 2024-07-27 NOTE — Research (Signed)
 Called pt to inform of Librexia sponsor request to stop taking trial drug as study has met GTED. LVM to call research office back to schedule EOT visit within 4 weeks.

## 2024-08-15 ENCOUNTER — Other Ambulatory Visit (HOSPITAL_COMMUNITY): Payer: Self-pay

## 2024-08-15 DIAGNOSIS — E559 Vitamin D deficiency, unspecified: Secondary | ICD-10-CM | POA: Diagnosis not present

## 2024-08-15 DIAGNOSIS — E1169 Type 2 diabetes mellitus with other specified complication: Secondary | ICD-10-CM | POA: Diagnosis not present

## 2024-08-15 DIAGNOSIS — E039 Hypothyroidism, unspecified: Secondary | ICD-10-CM | POA: Diagnosis not present

## 2024-08-15 DIAGNOSIS — I10 Essential (primary) hypertension: Secondary | ICD-10-CM | POA: Diagnosis not present

## 2024-08-15 DIAGNOSIS — E1165 Type 2 diabetes mellitus with hyperglycemia: Secondary | ICD-10-CM | POA: Diagnosis not present

## 2024-08-15 DIAGNOSIS — Z1331 Encounter for screening for depression: Secondary | ICD-10-CM | POA: Diagnosis not present

## 2024-08-15 DIAGNOSIS — B351 Tinea unguium: Secondary | ICD-10-CM | POA: Diagnosis not present

## 2024-08-15 DIAGNOSIS — Z23 Encounter for immunization: Secondary | ICD-10-CM | POA: Diagnosis not present

## 2024-08-15 DIAGNOSIS — E7800 Pure hypercholesterolemia, unspecified: Secondary | ICD-10-CM | POA: Diagnosis not present

## 2024-08-15 DIAGNOSIS — Z0000 Encounter for general adult medical examination without abnormal findings: Secondary | ICD-10-CM | POA: Diagnosis not present

## 2024-08-15 DIAGNOSIS — F419 Anxiety disorder, unspecified: Secondary | ICD-10-CM | POA: Diagnosis not present

## 2024-08-15 MED ORDER — FREESTYLE LIBRE 3 PLUS SENSOR MISC
11 refills | Status: AC
  Filled 2024-08-15: qty 6, 90d supply, fill #0

## 2024-08-15 MED ORDER — BENAZEPRIL-HYDROCHLOROTHIAZIDE 20-25 MG PO TABS
1.0000 | ORAL_TABLET | Freq: Every day | ORAL | 3 refills | Status: AC
  Filled 2024-08-15 – 2024-08-16 (×2): qty 90, 90d supply, fill #0

## 2024-08-15 MED ORDER — CLONAZEPAM 0.5 MG PO TABS
0.2500 mg | ORAL_TABLET | Freq: Every day | ORAL | 0 refills | Status: AC | PRN
  Filled 2024-08-15 – 2024-08-16 (×2): qty 30, 30d supply, fill #0

## 2024-08-15 MED ORDER — TERBINAFINE HCL 250 MG PO TABS
250.0000 mg | ORAL_TABLET | Freq: Every day | ORAL | 0 refills | Status: AC
  Filled 2024-08-15 – 2024-08-16 (×2): qty 84, 84d supply, fill #0

## 2024-08-15 MED ORDER — LEVOTHYROXINE SODIUM 50 MCG PO TABS
50.0000 ug | ORAL_TABLET | Freq: Every morning | ORAL | 3 refills | Status: AC
  Filled 2024-08-15 – 2024-08-16 (×3): qty 90, 90d supply, fill #0

## 2024-08-16 ENCOUNTER — Other Ambulatory Visit (HOSPITAL_COMMUNITY): Payer: Self-pay

## 2024-08-16 ENCOUNTER — Other Ambulatory Visit: Payer: Self-pay

## 2024-08-17 ENCOUNTER — Other Ambulatory Visit (HOSPITAL_COMMUNITY): Payer: Self-pay

## 2024-08-22 ENCOUNTER — Encounter

## 2024-08-22 ENCOUNTER — Other Ambulatory Visit (HOSPITAL_COMMUNITY): Payer: Self-pay

## 2024-08-22 VITALS — BP 129/50 | HR 78 | Temp 97.5°F | Resp 18 | Wt 151.0 lb

## 2024-08-22 DIAGNOSIS — Z006 Encounter for examination for normal comparison and control in clinical research program: Secondary | ICD-10-CM

## 2024-08-22 NOTE — Research (Cosign Needed Addendum)
 LIBREXIA END OF TREATMENT     [facility] Hematology/Chemistry drawn: [x] Yes [] No   EQ-5D-5L Assessment Completed? [x] Yes [] No Reason not done: [] Subject Forgot [] Subject too ill [] Subject refused [] Technical failure [] Other If Other, Specify Collection Date: 10/Dec/2025   Medication Kit Accountability Dispensed Status [] Dispensed [] Dispensed In Error [x] Not Dispensed If 'Not Dispensed', provide reason: [] Medical Decision [] Medication Kit not available or damaged [] Study medication discontinued [] Visit skipped or administration skipped [x] Study closure  Date Dispensed: Amount Dispensed: Return Status: [] Damaged/Returned by subject [] Missing/Not returned by subject [x] Returned by subject If not returned reasons: [] Forgot [] Lost Date Returned: 10/Dec/2025 Amount Returned:   [phone] 0 tablets [phone] tablets [phone] 70 tablets [phone] 70 tablets [phone] 70 tablets [id number] 55 tablets   Last dose of IP was on 14/Nov/2025 Subject was off drug from 26/Aug/2025 to 15/Oct/2025   Compliance%: 99   Were any suspected endpoint events or adverse events experienced? [x] Yes [] No  Diagnosed with bronchitis with a week of symptoms starting 29/Oct/2025. Foot fungus starting 03/Dec/2025 on Lamisil . Has not had any more palpitations.    Current Outpatient Medications:    albuterol  (VENTOLIN  HFA) 108 (90 Base) MCG/ACT inhaler, Inhale 1 puff into the lungs every 4 (four) hours as needed., Disp: 6.7 g, Rfl: 1   aspirin  EC 81 MG tablet, Take 1 tablet (81 mg total) by mouth daily. Swallow whole., Disp: 120 tablet, Rfl: 3   atorvastatin  (LIPITOR) 40 MG tablet, Take 1 tablet (40 mg total) by mouth daily., Disp: 90 tablet, Rfl: 3   azelastine  (ASTELIN ) 0.1 % nasal spray, Place 1 spray into both nostrils 2 (two) times daily., Disp: 90 mL, Rfl: 1   azelastine  (ASTELIN ) 137 MCG/SPRAY nasal spray, Place 1 spray into the nose daily as needed for allergies (post nasal drip). Use in each nostril  as directed, Disp: , Rfl:    benazepril -hydrochlorthiazide (LOTENSIN  HCT) 20-25 MG tablet, Take 1 tablet by mouth daily., Disp: 90 tablet, Rfl: 3   benazepril -hydrochlorthiazide (LOTENSIN  HCT) 20-25 MG tablet, Take 1 tablet by mouth daily., Disp: 90 tablet, Rfl: 3   Beta Carotene (VITAMIN A) 25000 UNIT capsule, Take 25,000 Units by mouth daily., Disp: , Rfl:    clonazePAM  (KLONOPIN ) 0.5 MG tablet, Take 1/2-1 tablet (0.25-0.5 mg total) by mouth as needed., Disp: 30 tablet, Rfl: 0   clonazePAM  (KLONOPIN ) 0.5 MG tablet, Take 1/2-1 tablet (0.25-0.5 mg total) by mouth daily as needed., Disp: 30 tablet, Rfl: 0   Continuous Glucose Sensor (FREESTYLE LIBRE 3 PLUS SENSOR) MISC, Use as directed to monitor blood sugar levels continuously changing every 15 days., Disp: 6 each, Rfl: 11   Continuous Glucose Sensor (FREESTYLE LIBRE 3 SENSOR) MISC, Change sensor every 14 days as directed. Use to monitor blood glucose as directed, Disp: 10 each, Rfl: 1   fexofenadine (ALLEGRA) 180 MG tablet, Take 180 mg by mouth daily., Disp: , Rfl:    glucose blood (FREESTYLE TEST STRIPS) test strip, USE AS INSTRUCTED TO CHECK BLOOD SUGAR ONCE DAILY DX CODE E11.9, Disp: 50 each, Rfl: 0   ketorolac  (ACULAR ) 0.5 % ophthalmic solution, Place 1 drop into the right eye 4 (four) times daily as directed., Disp: 10 mL, Rfl: 1   ketorolac  (ACULAR ) 0.5 % ophthalmic solution, Place 1 drop into the left eye 2 (two) times daily as directed, Disp: 10 mL, Rfl: 1   levothyroxine  (SYNTHROID ) 50 MCG tablet, Take 1 tablet (50 mcg total) by mouth every morning on an empty stomach., Disp: 90 tablet, Rfl: 3   metFORMIN  (GLUCOPHAGE )  500 MG tablet, Take 1 tablet (500 mg total) by mouth 2 (two) times daily with a meal., Disp: 180 tablet, Rfl: 3   metoprolol  succinate (TOPROL -XL) 25 MG 24 hr tablet, Take 1 tablet (25 mg total) by mouth daily., Disp: 90 tablet, Rfl: 3   nitroGLYCERIN  (NITROSTAT ) 0.4 MG SL tablet, Place 1 tablet (0.4 mg total) under the tongue  every 5 (five) minutes x 3 doses as needed for chest pain., Disp: 25 tablet, Rfl: 3   Potassium Chloride  ER 20 MEQ TBCR, Take 1 tablet (20 mEq total) by mouth daily., Disp: 90 tablet, Rfl: 3   PROAIR  HFA 108 (90 BASE) MCG/ACT inhaler, , Disp: , Rfl: 1   promethazine  (PHENERGAN ) 6.25 MG/5ML solution, Take 10 mLs (12.5 mg total) by mouth at bedtime as needed., Disp: 50 mL, Rfl: 0   terbinafine  (LAMISIL ) 250 MG tablet, Take 1 tablet (250 mg total) by mouth daily., Disp: 84 tablet, Rfl: 0   ticagrelor  (BRILINTA ) 90 MG TABS tablet, Take 1 tablet (90 mg total) by mouth 2 (two) times daily., Disp: 180 tablet, Rfl: 3   amoxicillin -clavulanate (AUGMENTIN ) 875-125 MG tablet, Take 1 tablet by mouth every 12 (twelve) hours. (Patient not taking: Reported on 08/22/2024), Disp: 10 tablet, Rfl: 0   gatifloxacin  (ZYMAXID ) 0.5 % SOLN, Place 1 drop into the right eye 4 (four) times daily. Store upside down! (Patient not taking: Reported on 08/22/2024), Disp: 5 mL, Rfl: 1   gatifloxacin  (ZYMAXID ) 0.5 % SOLN, Place 1 drop into the left eye 4 (four) times daily as directed. Store upside down! (Patient not taking: Reported on 08/22/2024), Disp: 5 mL, Rfl: 1   prednisoLONE  acetate (PRED FORTE ) 1 % ophthalmic suspension, Place 1 drop into the right eye 4 (four) times daily. Shake well before each use. (Patient not taking: Reported on 08/22/2024), Disp: 5 mL, Rfl: 1   prednisoLONE  acetate (PRED FORTE ) 1 % ophthalmic suspension, Shake well before each use and place 1 drop into the left eye 4 (four) times daily as directed. (Patient not taking: Reported on 08/22/2024), Disp: 5 mL, Rfl: 1   Study - LIBREXIA-ACS - milvexian 25 mg or placebo tablet (PI-Stuckey), Take 1 tablet by mouth 2 (two) times daily. Take with or without food. Bring bottle back to every visit. For Investigational Use Only. Please contact [facility] for any questions or concerns regarding this medication. (Patient not taking: Reported on  08/22/2024), Disp: 210 tablet, Rfl: 0   Study - LIBREXIA-ACS - milvexian 25 mg or placebo tablet (PI-Stuckey), Take 1 tablet by mouth 2 (two) times daily. For Investigational Use Only. Take with or without food. Bring bottle back to every visit. (Patient not taking: Reported on 08/22/2024), Disp: 420 tablet, Rfl: 0

## 2024-08-27 NOTE — Research (Cosign Needed)
 Are there any labs that are clinically significant?  Yes []  OR No[x]   TS

## 2024-08-30 DIAGNOSIS — Z006 Encounter for examination for normal comparison and control in clinical research program: Secondary | ICD-10-CM

## 2024-08-30 NOTE — Research (Signed)
 LIBREXIA END OF STUDY PHONE CALL    LVM to call research office back for EOS phone call.

## 2024-09-11 DIAGNOSIS — Z006 Encounter for examination for normal comparison and control in clinical research program: Secondary | ICD-10-CM

## 2024-09-11 NOTE — Research (Signed)
 LIBREXIA END OF STUDY PHONE CALL    LVM to call research office back

## 2024-09-12 DIAGNOSIS — Z006 Encounter for examination for normal comparison and control in clinical research program: Secondary | ICD-10-CM

## 2024-09-12 NOTE — Research (Signed)
 [facility]REX[facility] END OF STUDY PHONE CALL    Con meds: [x] Yes [] No Stopped taking terbinafine  on week after starting.    EQ-5D-5L Assessment Completed? [x] Yes [] No Reason not done: [] Subject Forgot [] Subject too ill [] Subject refused [] Technical failure [] Other If Other, Specify Collection Date: 31/Dec/2025   Were any suspected endpoint events or adverse events experienced? [] Yes [x] No   Current Medications[1]     [1]  Current Outpatient Medications:    albuterol  (VENTOLIN  HFA) 108 (90 Base) MCG/ACT inhaler, Inhale 1 puff into the lungs every 4 (four) hours as needed., Disp: 6.7 g, Rfl: 1   aspirin  EC 81 MG tablet, Take 1 tablet (81 mg total) by mouth daily. Swallow whole., Disp: 120 tablet, Rfl: 3   atorvastatin  (LIPITOR) 40 MG tablet, Take 1 tablet (40 mg total) by mouth daily., Disp: 90 tablet, Rfl: 3   azelastine  (ASTELIN ) 0.1 % nasal spray, Place 1 spray into both nostrils 2 (two) times daily., Disp: 90 mL, Rfl: 1   azelastine  (ASTELIN ) 137 MCG/SPRAY nasal spray, Place 1 spray into the nose daily as needed for allergies (post nasal drip). Use in each nostril as directed, Disp: , Rfl:    benazepril -hydrochlorthiazide (LOTENSIN  HCT) 20-25 MG tablet, Take 1 tablet by mouth daily., Disp: 90 tablet, Rfl: 3   benazepril -hydrochlorthiazide (LOTENSIN  HCT) 20-25 MG tablet, Take 1 tablet by mouth daily., Disp: 90 tablet, Rfl: 3   Beta Carotene (VITAMIN A) 25000 UNIT capsule, Take 25,000 Units by mouth daily., Disp: , Rfl:    clonazePAM  (KLONOPIN ) 0.5 MG tablet, Take 1/2-1 tablet (0.25-0.5 mg total) by mouth as needed., Disp: 30 tablet, Rfl: 0   clonazePAM  (KLONOPIN ) 0.5 MG tablet, Take 1/2-1 tablet (0.25-0.5 mg total) by mouth daily as needed., Disp: 30 tablet, Rfl: 0   Continuous Glucose Sensor (FREESTYLE LIBRE 3 PLUS SENSOR) MISC, Use as directed to monitor blood sugar levels continuously changing every 15 days., Disp: 6 each, Rfl: 11   Continuous Glucose Sensor (FREESTYLE LIBRE 3 SENSOR) MISC,  Change sensor every 14 days as directed. Use to monitor blood glucose as directed, Disp: 10 each, Rfl: 1   fexofenadine (ALLEGRA) 180 MG tablet, Take 180 mg by mouth daily., Disp: , Rfl:    glucose blood (FREESTYLE TEST STRIPS) test strip, USE AS INSTRUCTED TO CHECK BLOOD SUGAR ONCE DAILY DX CODE E11.9, Disp: 50 each, Rfl: 0   ketorolac  (ACULAR ) 0.5 % ophthalmic solution, Place 1 drop into the right eye 4 (four) times daily as directed., Disp: 10 mL, Rfl: 1   ketorolac  (ACULAR ) 0.5 % ophthalmic solution, Place 1 drop into the left eye 2 (two) times daily as directed, Disp: 10 mL, Rfl: 1   levothyroxine  (SYNTHROID ) 50 MCG tablet, Take 1 tablet (50 mcg total) by mouth every morning on an empty stomach., Disp: 90 tablet, Rfl: 3   metFORMIN  (GLUCOPHAGE ) 500 MG tablet, Take 1 tablet (500 mg total) by mouth 2 (two) times daily with a meal., Disp: 180 tablet, Rfl: 3   metoprolol  succinate (TOPROL -XL) 25 MG 24 hr tablet, Take 1 tablet (25 mg total) by mouth daily., Disp: 90 tablet, Rfl: 3   nitroGLYCERIN  (NITROSTAT ) 0.4 MG SL tablet, Place 1 tablet (0.4 mg total) under the tongue every 5 (five) minutes x 3 doses as needed for chest pain., Disp: 25 tablet, Rfl: 3   Potassium Chloride  ER 20 MEQ TBCR, Take 1 tablet (20 mEq total) by mouth daily., Disp: 90 tablet, Rfl: 3   PROAIR  HFA 108 (90 BASE) MCG/ACT inhaler, , Disp: ,  Rfl: 1   ticagrelor  (BRILINTA ) 90 MG TABS tablet, Take 1 tablet (90 mg total) by mouth 2 (two) times daily., Disp: 180 tablet, Rfl: 3   amoxicillin -clavulanate (AUGMENTIN ) 875-125 MG tablet, Take 1 tablet by mouth every 12 (twelve) hours. (Patient not taking: Reported on 09/12/2024), Disp: 10 tablet, Rfl: 0   gatifloxacin  (ZYMAXID ) 0.5 % SOLN, Place 1 drop into the right eye 4 (four) times daily. Store upside down! (Patient not taking: Reported on 09/12/2024), Disp: 5 mL, Rfl: 1   gatifloxacin  (ZYMAXID ) 0.5 % SOLN, Place 1 drop into the left eye 4 (four) times daily as directed. Store upside  down! (Patient not taking: Reported on 09/12/2024), Disp: 5 mL, Rfl: 1   prednisoLONE  acetate (PRED FORTE ) 1 % ophthalmic suspension, Place 1 drop into the right eye 4 (four) times daily. Shake well before each use. (Patient not taking: Reported on 09/12/2024), Disp: 5 mL, Rfl: 1   prednisoLONE  acetate (PRED FORTE ) 1 % ophthalmic suspension, Shake well before each use and place 1 drop into the left eye 4 (four) times daily as directed. (Patient not taking: Reported on 09/12/2024), Disp: 5 mL, Rfl: 1   promethazine  (PHENERGAN ) 6.25 MG/5ML solution, Take 10 mLs (12.5 mg total) by mouth at bedtime as needed., Disp: 50 mL, Rfl: 0   Study - LIBREXIA-ACS - milvexian 25 mg or placebo tablet (PI-Stuckey), Take 1 tablet by mouth 2 (two) times daily. Take with or without food. Bring bottle back to every visit. For Investigational Use Only. Please contact [facility] for any questions or concerns regarding this medication. (Patient not taking: Reported on 09/12/2024), Disp: 210 tablet, Rfl: 0   Study - LIBREXIA-ACS - milvexian 25 mg or placebo tablet (PI-Stuckey), Take 1 tablet by mouth 2 (two) times daily. For Investigational Use Only. Take with or without food. Bring bottle back to every visit. (Patient not taking: Reported on 09/12/2024), Disp: 420 tablet, Rfl: 0   terbinafine  (LAMISIL ) 250 MG tablet, Take 1 tablet (250 mg total) by mouth daily. (Patient not taking: Reported on 09/12/2024), Disp: 84 tablet, Rfl: 0

## 2024-09-26 ENCOUNTER — Encounter

## 2024-09-26 ENCOUNTER — Ambulatory Visit: Admitting: Physician Assistant

## 2024-09-28 ENCOUNTER — Encounter

## 2024-09-28 ENCOUNTER — Other Ambulatory Visit (HOSPITAL_COMMUNITY): Payer: Self-pay

## 2024-10-03 ENCOUNTER — Encounter: Payer: Self-pay | Admitting: Cardiovascular Disease

## 2024-10-03 ENCOUNTER — Ambulatory Visit: Admitting: Cardiovascular Disease

## 2024-10-03 VITALS — BP 120/66 | HR 70 | Ht 60.0 in | Wt 154.0 lb

## 2024-10-03 DIAGNOSIS — E782 Mixed hyperlipidemia: Secondary | ICD-10-CM | POA: Diagnosis not present

## 2024-10-03 DIAGNOSIS — I10 Essential (primary) hypertension: Secondary | ICD-10-CM

## 2024-10-03 DIAGNOSIS — R002 Palpitations: Secondary | ICD-10-CM | POA: Diagnosis not present

## 2024-10-03 DIAGNOSIS — I214 Non-ST elevation (NSTEMI) myocardial infarction: Secondary | ICD-10-CM | POA: Diagnosis not present

## 2024-10-03 NOTE — Assessment & Plan Note (Signed)
 History of non-STEMI s/p cardiac catheterization by Dr. Verlin 03/22/2023.  He demonstrated 80% calcified mid dominant RCA stenosis, 60% proximal LAD and 90% proximal nondominant circumflex.  She had unsuccessful attempt at intervention using shockwave angioplasty and the following day Dr. Darron performed orbital atherectomy successfully and placed a drug-eluting stent.  She was discharged home 2 days later.  She has been asymptomatic since and remains on a baby aspirin .

## 2024-10-03 NOTE — Assessment & Plan Note (Signed)
 Resolved after she came off of her study drug.

## 2024-10-03 NOTE — Assessment & Plan Note (Signed)
 History of hyperlipidemia on atorvastatin  with lipid profile performed 08/15/2024 revealing total cholesterol 144, LDL 54 and HDL 74.

## 2024-10-03 NOTE — Progress Notes (Signed)
 "     10/03/2024 Suzanne Sutton   Apr 22, 1946  [id number]  Primary Physician Claudene Lacks, MD Primary Cardiologist: Dorn JINNY Lesches MD [facility], [place], [place]  HPI:  Suzanne Sutton is a 79 y.o.   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  I last saw her in the office 06/25/2024.  She is accompanied by one of her daughters Suzanne Sutton today.  I met her when she was in the hospital in July for a non-STEMI.  Risk factors include treated hypertension, diabetes and hyperlipidemia.  She has a strong family history of heart disease with both parents and a brother all who have had ischemic heart disease at a young age.  She presented on 03/21/2023 with chest pain and positive enzymes.  The following day she had cardiac catheterization by Dr. Verlin revealing 80% calcified mid dominant RCA stenosis, 60% proximal LAD stenosis and 90% proximal nondominant circumflex stenosis.  She had unsuccessful attempt at intervention of her RCA using shockwave angioplasty and the following day she underwent successful orbital atherectomy, PCI and drug-eluting stenting by Dr. Darron .  She was discharged home 2 days later.     Since I saw her in the office 3 months ago she continues to do well.  She still works 3 days a week.  She does not drive.  She denies chest pain or shortness of breath.  Her palpitations have resolved.   Active Medications[1]   Allergies[2]  Social History   Socioeconomic History   Marital status: Widowed    Spouse name: Not on file   Number of children: Not on file   Years of education: Not on file   Highest education level: Not on file  Occupational History   Not on file  Tobacco Use   Smoking status: Never   Smokeless tobacco: Never  Substance and Sexual Activity   Alcohol use: Not on file   Drug use: Not on file   Sexual activity: Not on file  Other Topics Concern   Not on file  Social History  Narrative   Not on file   Social Drivers of Health   Tobacco Use: Low Risk (10/03/2024)   Patient History    Smoking Tobacco Use: Never    Smokeless Tobacco Use: Never    Passive Exposure: Not on file  Financial Resource Strain: Not on file  Food Insecurity: No Food Insecurity (03/21/2023)   Hunger Vital Sign    Worried About Running Out of Food in the Last Year: Never true    Ran Out of Food in the Last Year: Never true  Transportation Needs: No Transportation Needs (03/21/2023)   PRAPARE - Administrator, Civil Service (Medical): No    Lack of Transportation (Non-Medical): No  Physical Activity: Not on file  Stress: Not on file  Social Connections: Not on file  Intimate Partner Violence: Not At Risk (03/21/2023)   Humiliation, Afraid, Rape, and Kick questionnaire    Fear of Current or Ex-Partner: No    Emotionally Abused: No    Physically Abused: No    Sexually Abused: No  Depression (PHQ2-9): Low Risk (07/01/2023)   Depression (PHQ2-9)    PHQ-2 Score: 1  Recent Concern: Depression (PHQ2-9) - Medium Risk (04/07/2023)   Depression (PHQ2-9)    PHQ-2 Score: 6  Alcohol Screen: Not on file  Housing: Low Risk (03/21/2023)   Housing    Last  Housing Risk Score: 0  Utilities: Not At Risk (03/21/2023)   AHC Utilities    Threatened with loss of utilities: No  Health Literacy: Not on file     Review of Systems: General: negative for chills, fever, night sweats or weight changes.  Cardiovascular: negative for chest pain, dyspnea on exertion, edema, orthopnea, palpitations, paroxysmal nocturnal dyspnea or shortness of breath Dermatological: negative for rash Respiratory: negative for cough or wheezing Urologic: negative for hematuria Abdominal: negative for nausea, vomiting, diarrhea, bright red blood per rectum, melena, or hematemesis Neurologic: negative for visual changes, syncope, or dizziness All other systems reviewed and are otherwise negative except as noted  above.    Blood pressure 120/66, pulse 70, height 5' (1.524 m), weight 154 lb (69.9 kg), SpO2 98%.  General appearance: alert and no distress Neck: no adenopathy, no carotid bruit, no JVD, supple, symmetrical, trachea midline, and thyroid  not enlarged, symmetric, no tenderness/mass/nodules Lungs: clear to auscultation bilaterally Heart: regular rate and rhythm, S1, S2 normal, no murmur, click, rub or gallop Extremities: extremities normal, atraumatic, no cyanosis or edema Pulses: 2+ and symmetric Skin: Skin color, texture, turgor normal. No rashes or lesions Neurologic: Grossly normal  EKG not performed today      ASSESSMENT AND PLAN:   Hyperlipidemia History of hyperlipidemia on atorvastatin  with lipid profile performed 08/15/2024 revealing total cholesterol 144, LDL 54 and HDL 74.  Essential hypertension, benign History of hypertension blood pressure measured today 120/66.  She is on metoprolol , benazepril /hydrochlorothiazide .  NSTEMI (non-ST elevated myocardial infarction) [facility]) History of non-STEMI s/p cardiac catheterization by Dr. Verlin 03/22/2023.  He demonstrated 80% calcified mid dominant RCA stenosis, 60% proximal LAD and 90% proximal nondominant circumflex.  She had unsuccessful attempt at intervention using shockwave angioplasty and the following day Dr. Darron performed orbital atherectomy successfully and placed a drug-eluting stent.  She was discharged home 2 days later.  She has been asymptomatic since and remains on a baby aspirin .  Palpitations Resolved after she came off of her study drug.     Dorn DOROTHA Lesches MD FACP,FACC,FAHA, [facility] 10/03/2024 2:54 PM    [1]  Current Meds  Medication Sig   albuterol  (VENTOLIN  HFA) 108 (90 Base) MCG/ACT inhaler Inhale 1 puff into the lungs every 4 (four) hours as needed.   amoxicillin -clavulanate (AUGMENTIN ) 875-125 MG tablet Take 1 tablet by mouth every 12 (twelve) hours.   aspirin  EC 81 MG tablet Take 1 tablet (81 mg  total) by mouth daily. Swallow whole.   atorvastatin  (LIPITOR) 40 MG tablet Take 1 tablet (40 mg total) by mouth daily.   azelastine  (ASTELIN ) 137 MCG/SPRAY nasal spray Place 1 spray into the nose daily as needed for allergies (post nasal drip). Use in each nostril as directed   benazepril -hydrochlorthiazide (LOTENSIN  HCT) 20-25 MG tablet Take 1 tablet by mouth daily.   Beta Carotene (VITAMIN A) 25000 UNIT capsule Take 25,000 Units by mouth daily.   clonazePAM  (KLONOPIN ) 0.5 MG tablet Take 1/2-1 tablet (0.25-0.5 mg total) by mouth as needed.   fexofenadine (ALLEGRA) 180 MG tablet Take 180 mg by mouth daily.   glucose blood (FREESTYLE TEST STRIPS) test strip USE AS INSTRUCTED TO CHECK BLOOD SUGAR ONCE DAILY DX CODE E11.9   levothyroxine  (SYNTHROID ) 50 MCG tablet Take 1 tablet (50 mcg total) by mouth every morning on an empty stomach.   metFORMIN  (GLUCOPHAGE ) 500 MG tablet Take 1 tablet (500 mg total) by mouth 2 (two) times daily with a meal.   metoprolol  succinate (TOPROL -XL) 25 MG  24 hr tablet Take 1 tablet (25 mg total) by mouth daily.   nitroGLYCERIN  (NITROSTAT ) 0.4 MG SL tablet Place 1 tablet (0.4 mg total) under the tongue every 5 (five) minutes x 3 doses as needed for chest pain.   Potassium Chloride  ER 20 MEQ TBCR Take 1 tablet (20 mEq total) by mouth daily.   PROAIR  HFA 108 (90 BASE) MCG/ACT inhaler    ticagrelor  (BRILINTA ) 90 MG TABS tablet Take 1 tablet (90 mg total) by mouth 2 (two) times daily.  [2]  Allergies Allergen Reactions   Cozaar [Losartan Potassium] Swelling    Had knee swelling   Crestor [Rosuvastatin] Swelling    Leg swelling   "

## 2024-10-03 NOTE — Assessment & Plan Note (Signed)
 History of hypertension blood pressure measured today 120/66.  She is on metoprolol , benazepril /hydrochlorothiazide .

## 2024-10-03 NOTE — Patient Instructions (Signed)

## 2024-10-19 ENCOUNTER — Other Ambulatory Visit (HOSPITAL_COMMUNITY): Payer: Self-pay
